# Patient Record
Sex: Female | Born: 1950 | Race: White | Hispanic: No | Marital: Married | Smoking: Never smoker
Health system: Southern US, Community
[De-identification: ages and names within clinical notes are randomized; demographics above are authoritative.]

## PROBLEM LIST (undated history)

## (undated) DIAGNOSIS — I2510 Atherosclerotic heart disease of native coronary artery without angina pectoris: Secondary | ICD-10-CM

## (undated) DIAGNOSIS — R51 Headache: Secondary | ICD-10-CM

## (undated) DIAGNOSIS — B019 Varicella without complication: Secondary | ICD-10-CM

## (undated) DIAGNOSIS — K589 Irritable bowel syndrome without diarrhea: Secondary | ICD-10-CM

## (undated) DIAGNOSIS — R519 Headache, unspecified: Secondary | ICD-10-CM

## (undated) DIAGNOSIS — R918 Other nonspecific abnormal finding of lung field: Secondary | ICD-10-CM

## (undated) DIAGNOSIS — M8580 Other specified disorders of bone density and structure, unspecified site: Secondary | ICD-10-CM

## (undated) DIAGNOSIS — M1990 Unspecified osteoarthritis, unspecified site: Secondary | ICD-10-CM

## (undated) DIAGNOSIS — G4730 Sleep apnea, unspecified: Secondary | ICD-10-CM

## (undated) DIAGNOSIS — J302 Other seasonal allergic rhinitis: Secondary | ICD-10-CM

## (undated) DIAGNOSIS — K635 Polyp of colon: Secondary | ICD-10-CM

## (undated) DIAGNOSIS — K219 Gastro-esophageal reflux disease without esophagitis: Secondary | ICD-10-CM

## (undated) DIAGNOSIS — J45909 Unspecified asthma, uncomplicated: Secondary | ICD-10-CM

## (undated) HISTORY — DX: Polyp of colon: K63.5

## (undated) HISTORY — DX: Gastro-esophageal reflux disease without esophagitis: K21.9

## (undated) HISTORY — PX: CARPAL TUNNEL RELEASE: SHX101

## (undated) HISTORY — DX: Other nonspecific abnormal finding of lung field: R91.8

## (undated) HISTORY — DX: Varicella without complication: B01.9

## (undated) HISTORY — PX: PARTIAL HYSTERECTOMY: SHX80

## (undated) HISTORY — DX: Atherosclerotic heart disease of native coronary artery without angina pectoris: I25.10

## (undated) HISTORY — DX: Unspecified osteoarthritis, unspecified site: M19.90

## (undated) HISTORY — DX: Irritable bowel syndrome without diarrhea: K58.9

## (undated) HISTORY — PX: ABDOMINAL HYSTERECTOMY: SHX81

## (undated) HISTORY — DX: Sleep apnea, unspecified: G47.30

## (undated) HISTORY — PX: MENISCUS REPAIR: SHX5179

## (undated) HISTORY — DX: Headache, unspecified: R51.9

## (undated) HISTORY — PX: ESOPHAGOGASTRODUODENOSCOPY: SHX1529

## (undated) HISTORY — DX: Other specified disorders of bone density and structure, unspecified site: M85.80

## (undated) HISTORY — PX: COLONOSCOPY: SHX174

## (undated) HISTORY — DX: Unspecified asthma, uncomplicated: J45.909

## (undated) HISTORY — PX: UPPER GASTROINTESTINAL ENDOSCOPY: SHX188

## (undated) HISTORY — DX: Headache: R51

---

## 2003-08-13 ENCOUNTER — Encounter: Payer: Self-pay | Admitting: Family Medicine

## 2004-11-14 ENCOUNTER — Ambulatory Visit: Payer: Self-pay | Admitting: Internal Medicine

## 2004-12-22 ENCOUNTER — Ambulatory Visit: Payer: Self-pay | Admitting: Internal Medicine

## 2004-12-25 HISTORY — PX: KNEE ARTHROSCOPY: SUR90

## 2004-12-27 ENCOUNTER — Ambulatory Visit: Payer: Self-pay | Admitting: General Practice

## 2006-01-17 ENCOUNTER — Ambulatory Visit: Payer: Self-pay | Admitting: Internal Medicine

## 2006-11-27 ENCOUNTER — Ambulatory Visit: Payer: Self-pay | Admitting: Internal Medicine

## 2007-01-20 ENCOUNTER — Ambulatory Visit: Payer: Self-pay | Admitting: Internal Medicine

## 2008-03-16 ENCOUNTER — Ambulatory Visit: Payer: Self-pay | Admitting: Internal Medicine

## 2008-05-25 ENCOUNTER — Ambulatory Visit: Payer: Self-pay | Admitting: Internal Medicine

## 2008-06-01 ENCOUNTER — Ambulatory Visit: Payer: Self-pay | Admitting: Internal Medicine

## 2008-08-10 ENCOUNTER — Ambulatory Visit: Payer: Self-pay | Admitting: Family Medicine

## 2008-11-10 ENCOUNTER — Encounter: Payer: Self-pay | Admitting: Family Medicine

## 2009-02-22 ENCOUNTER — Encounter (INDEPENDENT_AMBULATORY_CARE_PROVIDER_SITE_OTHER): Payer: Self-pay | Admitting: *Deleted

## 2009-03-21 ENCOUNTER — Ambulatory Visit: Payer: Self-pay | Admitting: Family Medicine

## 2009-03-21 DIAGNOSIS — K279 Peptic ulcer, site unspecified, unspecified as acute or chronic, without hemorrhage or perforation: Secondary | ICD-10-CM | POA: Insufficient documentation

## 2009-03-21 DIAGNOSIS — K219 Gastro-esophageal reflux disease without esophagitis: Secondary | ICD-10-CM

## 2009-03-21 DIAGNOSIS — G43009 Migraine without aura, not intractable, without status migrainosus: Secondary | ICD-10-CM | POA: Insufficient documentation

## 2009-03-21 DIAGNOSIS — R1013 Epigastric pain: Secondary | ICD-10-CM | POA: Insufficient documentation

## 2009-03-21 DIAGNOSIS — K580 Irritable bowel syndrome with diarrhea: Secondary | ICD-10-CM | POA: Insufficient documentation

## 2009-03-21 DIAGNOSIS — K589 Irritable bowel syndrome without diarrhea: Secondary | ICD-10-CM | POA: Insufficient documentation

## 2009-03-21 HISTORY — DX: Gastro-esophageal reflux disease without esophagitis: K21.9

## 2009-03-21 HISTORY — DX: Irritable bowel syndrome, unspecified: K58.9

## 2009-03-23 ENCOUNTER — Ambulatory Visit: Payer: Self-pay | Admitting: Family Medicine

## 2009-03-23 ENCOUNTER — Encounter: Payer: Self-pay | Admitting: Family Medicine

## 2009-03-30 ENCOUNTER — Encounter (INDEPENDENT_AMBULATORY_CARE_PROVIDER_SITE_OTHER): Payer: Self-pay | Admitting: *Deleted

## 2009-03-31 ENCOUNTER — Telehealth: Payer: Self-pay | Admitting: Family Medicine

## 2009-04-22 ENCOUNTER — Ambulatory Visit: Payer: Self-pay | Admitting: Internal Medicine

## 2009-04-22 DIAGNOSIS — K625 Hemorrhage of anus and rectum: Secondary | ICD-10-CM | POA: Insufficient documentation

## 2009-04-26 ENCOUNTER — Telehealth: Payer: Self-pay | Admitting: Internal Medicine

## 2009-05-12 ENCOUNTER — Ambulatory Visit: Payer: Self-pay | Admitting: Internal Medicine

## 2009-05-12 ENCOUNTER — Telehealth: Payer: Self-pay | Admitting: Internal Medicine

## 2009-05-12 ENCOUNTER — Encounter: Payer: Self-pay | Admitting: Internal Medicine

## 2009-05-24 ENCOUNTER — Encounter: Payer: Self-pay | Admitting: Internal Medicine

## 2009-08-10 ENCOUNTER — Ambulatory Visit: Payer: Self-pay | Admitting: Internal Medicine

## 2009-08-10 DIAGNOSIS — J069 Acute upper respiratory infection, unspecified: Secondary | ICD-10-CM | POA: Insufficient documentation

## 2009-08-15 ENCOUNTER — Ambulatory Visit: Payer: Self-pay | Admitting: Family Medicine

## 2009-08-15 DIAGNOSIS — J3089 Other allergic rhinitis: Secondary | ICD-10-CM | POA: Insufficient documentation

## 2009-08-22 ENCOUNTER — Telehealth: Payer: Self-pay | Admitting: Internal Medicine

## 2009-10-19 ENCOUNTER — Ambulatory Visit: Payer: Self-pay | Admitting: Family Medicine

## 2009-10-19 DIAGNOSIS — E7800 Pure hypercholesterolemia, unspecified: Secondary | ICD-10-CM | POA: Insufficient documentation

## 2009-10-19 HISTORY — DX: Pure hypercholesterolemia, unspecified: E78.00

## 2009-10-20 LAB — CONVERTED CEMR LAB
ALT: 21 units/L (ref 0–35)
AST: 21 units/L (ref 0–37)
Albumin: 4.2 g/dL (ref 3.5–5.2)
Alkaline Phosphatase: 87 units/L (ref 39–117)
BUN: 18 mg/dL (ref 6–23)
Bilirubin, Direct: 0 mg/dL (ref 0.0–0.3)
CO2: 30 meq/L (ref 19–32)
Calcium: 9.6 mg/dL (ref 8.4–10.5)
Chloride: 101 meq/L (ref 96–112)
Cholesterol: 217 mg/dL — ABNORMAL HIGH (ref 0–200)
Creatinine, Ser: 0.8 mg/dL (ref 0.4–1.2)
Direct LDL: 152.8 mg/dL
GFR calc non Af Amer: 78.25 mL/min (ref >60)
Glucose, Bld: 88 mg/dL (ref 70–99)
HDL: 45.3 mg/dL (ref >39.00)
Potassium: 4.9 meq/L (ref 3.5–5.1)
Sodium: 140 meq/L (ref 135–145)
Total Bilirubin: 0.6 mg/dL (ref 0.3–1.2)
Total CHOL/HDL Ratio: 5
Total Protein: 7.5 g/dL (ref 6.0–8.3)
Triglycerides: 206 mg/dL — ABNORMAL HIGH (ref 0.0–149.0)
VLDL: 41.2 mg/dL — ABNORMAL HIGH (ref 0.0–40.0)

## 2009-10-27 ENCOUNTER — Ambulatory Visit: Payer: Self-pay | Admitting: Family Medicine

## 2009-10-27 LAB — CONVERTED CEMR LAB
LDL Cholesterol: 152 mg/dL
Pap Smear: NORMAL

## 2010-01-17 ENCOUNTER — Ambulatory Visit: Payer: Self-pay | Admitting: Family Medicine

## 2010-01-23 LAB — CONVERTED CEMR LAB
Cholesterol: 198 mg/dL (ref 0–200)
HDL: 52.9 mg/dL (ref >39.00)
LDL Cholesterol: 113 mg/dL — ABNORMAL HIGH (ref 0–99)
Total CHOL/HDL Ratio: 4
Triglycerides: 161 mg/dL — ABNORMAL HIGH (ref 0.0–149.0)
VLDL: 32.2 mg/dL (ref 0.0–40.0)

## 2010-04-24 ENCOUNTER — Ambulatory Visit: Payer: Self-pay | Admitting: Family Medicine

## 2010-05-02 LAB — CONVERTED CEMR LAB
ALT: 17 units/L (ref 0–35)
AST: 17 units/L (ref 0–37)
Cholesterol: 186 mg/dL (ref 0–200)
HDL: 40.7 mg/dL (ref >39.00)
LDL Cholesterol: 107 mg/dL — ABNORMAL HIGH (ref 0–99)
Total CHOL/HDL Ratio: 5
Triglycerides: 194 mg/dL — ABNORMAL HIGH (ref 0.0–149.0)
VLDL: 38.8 mg/dL (ref 0.0–40.0)

## 2010-07-27 ENCOUNTER — Ambulatory Visit: Payer: Self-pay | Admitting: Family Medicine

## 2010-08-01 LAB — CONVERTED CEMR LAB
ALT: 21 units/L (ref 0–35)
AST: 17 units/L (ref 0–37)
Cholesterol: 229 mg/dL — ABNORMAL HIGH (ref 0–200)
Direct LDL: 167.2 mg/dL
HDL: 48 mg/dL (ref >39.00)
Total CHOL/HDL Ratio: 5
Triglycerides: 146 mg/dL (ref 0.0–149.0)
VLDL: 29.2 mg/dL (ref 0.0–40.0)

## 2010-10-11 ENCOUNTER — Telehealth: Payer: Self-pay | Admitting: Family Medicine

## 2011-01-08 ENCOUNTER — Telehealth: Payer: Self-pay | Admitting: Family Medicine

## 2011-01-21 LAB — CONVERTED CEMR LAB
ALT: 16 units/L (ref 0–35)
AST: 16 units/L (ref 0–37)
Albumin: 4.1 g/dL (ref 3.5–5.2)
Alkaline Phosphatase: 81 units/L (ref 39–117)
BUN: 19 mg/dL (ref 6–23)
Basophils Absolute: 0.1 10*3/uL (ref 0.0–0.1)
Basophils Relative: 0.7 % (ref 0.0–3.0)
Bilirubin, Direct: 0 mg/dL (ref 0.0–0.3)
CO2: 30 meq/L (ref 19–32)
Calcium: 9.2 mg/dL (ref 8.4–10.5)
Chloride: 107 meq/L (ref 96–112)
Creatinine, Ser: 1 mg/dL (ref 0.4–1.2)
Eosinophils Absolute: 0.1 10*3/uL (ref 0.0–0.7)
Eosinophils Relative: 0.6 % (ref 0.0–5.0)
GFR calc non Af Amer: 60.61 mL/min (ref >60)
Glucose, Bld: 93 mg/dL (ref 70–99)
HCT: 38.9 % (ref 36.0–46.0)
Hemoglobin: 13.6 g/dL (ref 12.0–15.0)
Lipase: 25 units/L (ref 11.0–59.0)
Lymphocytes Relative: 25.4 % (ref 12.0–46.0)
Lymphs Abs: 2.3 10*3/uL (ref 0.7–4.0)
MCHC: 35 g/dL (ref 30.0–36.0)
MCV: 92.5 fL (ref 78.0–100.0)
Monocytes Absolute: 0.6 10*3/uL (ref 0.1–1.0)
Monocytes Relative: 6.8 % (ref 3.0–12.0)
Neutro Abs: 5.8 10*3/uL (ref 1.4–7.7)
Neutrophils Relative %: 66.5 % (ref 43.0–77.0)
Platelets: 303 10*3/uL (ref 150.0–400.0)
Potassium: 4.5 meq/L (ref 3.5–5.1)
RBC: 4.21 M/uL (ref 3.87–5.11)
RDW: 11.6 % (ref 11.5–14.6)
Sodium: 143 meq/L (ref 135–145)
Total Bilirubin: 0.7 mg/dL (ref 0.3–1.2)
Total Protein: 7.3 g/dL (ref 6.0–8.3)
WBC: 8.9 10*3/uL (ref 4.5–10.5)

## 2011-01-25 NOTE — Procedures (Signed)
Summary: [facility] - Colonoscopy: hemorrhoids  [facility] - ENDOSCOPY LAB   Imported By: Carin Primrose 03/31/2009 15:40:11  _____________________________________________________________________  External Attachment:    Type:   Image     Comment:   External Document

## 2011-01-25 NOTE — Letter (Signed)
Summary: Results Follow up Letter  [name]  [address] [place], [place] [address]   Phone: [phone]  Fax: [phone]    03/30/2009 MRN: [id number]    [facility] [address] [place], [place]  [address]    Dear Ms. Claire Thomas,  The following are the results of your recent test(s):  Test         Result    Pap Smear:        Normal _____  Not Normal _____ Comments: ______________________________________________________ Cholesterol: LDL(Bad cholesterol):         Your goal is less than:         HDL (Good cholesterol):       Your goal is more than: Comments:  ______________________________________________________ Mammogram:        Normal __X___  Not Normal _____ Comments:  Yearly follow up is recommended.   ___________________________________________________________________ Hemoccult:        Normal _____  Not normal _______ Comments:    _____________________________________________________________________ Other Tests:    We routinely do not discuss normal results over the telephone.  If you desire a copy of the results, or you have any questions about this information we can discuss them at your next office visit.   Sincerely,     Excell Seltzer, M.D.  AEB:lsf

## 2011-01-25 NOTE — Progress Notes (Signed)
Summary: prob with prep  Phone Note Call from Patient Call back at Home Phone [phone]   Caller: Patient Call For: Feliciano Wynter Reason for Call: Talk to Nurse Details for Reason: proc today Summary of Call: ECL sch this afternoon-pt vomited her prep this morning Initial call taken by: Guadlupe Spanish [facility],  May 12, 2009 9:09 AM  Follow-up for Phone Call        Spoke with Dr Leone Payor and he suggested that the pt. take 2 Dulcolax and drink plenty of clear liquids up until 12:00.  She is to arrive 30 minutes early to have at least 1 fleets enema.  Pt verbalized understanding. Follow-up by: Jennye Boroughs RN,  May 12, 2009 9:23 AM

## 2011-01-25 NOTE — Progress Notes (Signed)
Summary: Rx not at pharmacy  Phone Note Call from Patient Call back at Home Phone [phone]   Caller: Patient Call For: gessner Reason for Call: Talk to Nurse Summary of Call: Rx not in the phamacy for Librax and Movi-prep Initial call taken by: Tawni Levy,  Apr 26, 2009 8:48 AM  Follow-up for Phone Call        Both prescriptions sent on 4/30 to [facility].  I left a message at patient's home and work number to [facility]. Francee Piccolo CMA  Apr 26, 2009 10:02 AM   Pt returned call and verified pharmacy.  Per Tim at [facility], both prescriptions are in bin and ready to be picked up.   Pt notified both rx's ready to be picked up. Follow-up by: Francee Piccolo CMA,  Apr 26, 2009 10:20 AM

## 2011-01-25 NOTE — Assessment & Plan Note (Signed)
Summary: epigastric pain, esophageal reflux   History of Present Illness Visit Type: Initial Consult Primary GI MD: Stan Head MD [facility] Primary Provider: Kerby Nora, MD Requesting Provider: Kerby Nora, MD Chief Complaint: epigastric pain with reflux, brbpr off and on, diarrhea History of Present Illness:   60 yo white woman with many years of "stomach trouble". Now with worsening reflux despite medication (PPI) Intermittent bright red blood per rectum  She had a colonoscopy 6 years ok, but had polyps and letter said was due to follow-up.  Does have IBS-D.  Cannot pinpoint foods that reliably cause pc defecation  Aciphex and ranitidine 1-2 x and Tums but still with sxs and food does';t move down right at times  burning and boiling in epigastrium and chest  Hx Aciphex, Prilosec  PCP in past suggested Lexapro but made her sleepy and only on x 1 month Rarely uses alprazolam Librax did help in past stress can upset stomach/bowels   GI Review of Systems    Reports abdominal pain, acid reflux, and  heartburn.     Location of  Abdominal pain: epigastric area.    Denies belching, bloating, chest pain, dysphagia with liquids, dysphagia with solids, loss of appetite, nausea, vomiting, vomiting blood, weight loss, and  weight gain.      Reports diarrhea, hemorrhoids, and  rectal bleeding.     Denies anal fissure, black tarry stools, change in bowel habit, constipation, diverticulosis, fecal incontinence, heme positive stool, irritable bowel syndrome, jaundice, light color stool, liver problems, and  rectal pain.    Current Medications (verified): 1)  Aciphex 20 Mg Tbec (Rabeprazole Sodium) .... Take 1 Tablet By Mouth Once A Day 2)  Ranitidine Hcl 150 Mg Caps (Ranitidine Hcl) .... Take 1 Tablet By Mouth Once A Day As Needed 3)  Estradiol 1 Mg Tabs (Estradiol) .... Once Daily (On Occasion) 4)  Alprazolam 0.25 Mg Tabs (Alprazolam) .... Take 2 By Mouth Daily.  Takes Maybe 2 Times Per  Year If Needed. 5)  Calcium Carbonate-Vitamin D 600-400 Mg-Unit  Tabs (Calcium Carbonate-Vitamin D) .... Takes 2 By Mouth Daily  Allergies (verified): No Known Drug Allergies  Past History:  Past Medical History:    GERD    Irritable Bowel Syndrome    Anxiety  Past Surgical History:    Reviewed history from 03/21/2009 and no changes required:    2008 torn meniscus right knee    2005 carpal tunnel repair, B    Hysterectomy, partial, both ovaries remaining: menorhagia  Family History:    Reviewed history from 03/21/2009 and no changes required:       father: prostate cancer, CAD stent age 43s       mother: colon cancer age mid 40s, mastectomy for precancerous cells B breasts       brother: healthy  Social History:    Reviewed history from 03/21/2009 and no changes required:       Land       Married       2 children: healthy       Never Smoked       Alcohol use-yes, 1 glass of wine monthly       Drug use-no       Regular exercise-no,       Diet: fruits and veggies, water  Review of Systems       The patient complains of allergy/sinus, cough, fatigue, and shortness of breath.         All other ROS negative  except as per HPI.   Vital Signs:  Patient profile:   60 year old female Height:      66 inches Weight:      190.25 pounds BMI:     30.82 Pulse rate:   78 / minute Pulse rhythm:   regular BP sitting:   118 / 78  (right arm) Cuff size:   regular  Vitals Entered By: Christie Nottingham CMA (April 22, 2009 11:29 AM)  Physical Exam  General:  Well developed, well nourished, no acute distress. overweight Eyes:  PERRLA, no icterus. Mouth:  No deformity or lesions, dentition normal. Neck:  Supple; no masses or thyromegaly. Lungs:  Clear throughout to auscultation. Heart:  Regular rate and rhythm; no murmurs, rubs,  or bruits. Abdomen:  Soft, nontender and nondistended except mildly tender epigastrium.No masses, hepatosplenomegaly or hernias  noted. Normal bowel sounds. Rectal:  deferred until time of colonoscopy.   Extremities:  No clubbing, cyanosis, edema or deformities noted. Neurologic:  Alert and  oriented x4;  grossly normal neurologically.   Impression & Recommendations:  Problem # 1:  EPIGASTRIC PAIN (ICD-789.06) Assessment Unchanged Not responding to PPI. Given background of IBS, ? functional dyspepsia. Features more consistent with stomach/duodenum or even GERD than gallbladder. Orders: Colon/Endo (Colon/Endo) CBC, CMET, lipase, H. pylori Ab all normal  Problem # 2:  GASTROESOPHAGEAL REFLUX DISEASE (ICD-530.81) Assessment: Deteriorated Not doing well despite PPI and H2 blocker. ? functional or tachyphylaxis but sounds like never realy controlled on a PPI. Orders: Colon/Endo (Colon/Endo)  Problem # 3:  DYSPHAGIA (WUJ-811.91) Assessment: New [place], some features of impact dyspagia. May need esophageal dilation. Orders: Colon/Endo (Colon/Endo)  Problem # 4:  RECTAL BLEEDING (ICD-569.3) Assessment: New Sounds anorectal but need to know. Did have hemorrhoids on last colonoscopy but no sig other pathology. Orders: Colon/Endo (Colon/Endo)  Problem # 5:  COLORECTAL CANCER, FAMILY HX MOM IN 50'S (ICD-V16.0)  Orders: Colon/Endo (Colon/Endo)  Patient Instructions: 1)  [facility] Patient Information Guide given to patient.  2)  Please pick up your medications at your pharmacy. 3)  Copy sent to : Kerby Nora, MD 4)  The medication list was reviewed and reconciled.  All changed / newly prescribed medications were explained.  A complete medication list was provided to the patient / caregiver. Prescriptions: MOVIPREP 100 GM  SOLR (PEG-KCL-NACL-NASULF-NA ASC-C) As per prep instructions.  #1 x 0   Entered by:   Francee Piccolo CMA   Authorized by:   Iva Boop MD   Signed by:   Francee Piccolo CMA on 04/22/2009   Method used:   [id number]        [address]       [place], [place]  [address]       Ph: [phone] or [phone]       Fax: [phone]   RxID:   [id number] LIBRAX 2.5-5 MG  CAPS (CLIDINIUM-CHLORDIAZEPOXIDE) Take 1 tablet by mouth before meals  #90 x 1   Entered by:   Francee Piccolo CMA   Authorized by:   Iva Boop MD   Signed by:   Francee Piccolo CMA on 04/22/2009   Method used:   [facility]        [id number]       [address]  [place], [place]  [address]       Ph: [phone] or [phone]       Fax: [phone]   RxID:   [id number]

## 2011-01-25 NOTE — Letter (Signed)
Summary: Patient Notice- Polyp Results   Gastroenterology  [address] [place], [place] [address]   Phone: [phone]  Fax: [phone]        May 24, 2009 MRN: [id number]    [facility] [address] [place], [place]  [address]    Dear Ms. HARALSON,  The polyp removed from your colon was adenomatous. This means that it was pre-cancerous or that  it had the potential to change into cancer over time.   I recommend that you have a repeat colonoscopy in 3 years to determine if you have developed any new polyps over time. If you develop any new rectal bleeding, abdominal pain or significant bowel habit changes, please contact us before then.  The stomach biopsies showed minimal inflammation, not necessarily a cause of your problems. By the time you have received this letter you should have heard from my office regarding a change in therapy and a follow-up visit.  Please call us if you are having persistent problems or have questions about your condition that have not been fully answered at this time.  Sincerely,  Iva Boop MD  This letter has been electronically signed by your physician.

## 2011-01-25 NOTE — Miscellaneous (Signed)
Summary: Pt & Physician Agreement/Lotronex  Pt & Physician Agreement/Lotronex   Imported By: Sherian Rein 09/30/2009 10:24:50  _____________________________________________________________________  External Attachment:    Type:   Image     Comment:   External Document

## 2011-01-25 NOTE — Procedures (Signed)
Summary: EGD: minimal chronic gastritis and tortuous esophagus   EGD  Procedure date:  05/12/2009  Findings:      Location: [facility]    EGD  Procedure date:  05/12/2009  Findings:      1) Mild gastritis in the body and the antrum of the stomach CHRONIC GASTRITIS NO H PYLORI 2) Tortuous in the total esophagus, suggestive of dysmotility. No stricture. 3) Otherwise normal examination  ENDOSCOPY PROCEDURE REPORT  PATIENT:  Claire Thomas, Claire Thomas  MR#:  [id number] BIRTHDATE:   25-Feb-1951, 57 yrs. old   GENDER:   female  ENDOSCOPIST:   Iva Boop, MD, [name] Referred by: Excell Seltzer, M.D.  PROCEDURE DATE:  05/12/2009 PROCEDURE:  EGD with biopsy ASA CLASS:   Class II INDICATIONS: epigastric pain, dysphagia   MEDICATIONS:    Fentanyl 25 mcg IV, Versed 6 mg IV, promethazine (Phenergan) 12.5 mg IV promethazine given in admitting TOPICAL ANESTHETIC:   Exactacain Spray  DESCRIPTION OF PROCEDURE:   After the risks benefits and alternatives of the procedure were thoroughly explained, informed consent was obtained.  The [facility] [id number] endoscope was introduced through the mouth and advanced to the second portion of the duodenum, without limitations.  The instrument was slowly withdrawn as the mucosa was fully examined. <<PROCEDUREIMAGES>>      <<OLD IMAGES>>  Mild gastritis was found in the body and the antrum of the stomach. Mottled mucosa and erythema Multiple biopsies were obtained and sent to pathology.  tortuous in the total esophagus.  Otherwise the examination was normal.    Retroflexed views revealed no abnormalities.    The scope was then withdrawn from the patient and the procedure completed.  COMPLICATIONS:   None  ENDOSCOPIC IMPRESSION:  1) Mild gastritis in the body and the antrum of the stomach  2) Tortuous in the total esophagus, suggestive of dysmotility. No stricture.  3) Otherwise normal examination RECOMMENDATIONS:  1) await pathology results,  suspect she will need different PPI and antispasmodic       Iva Boop, MD, [facility]    CC: Excell Seltzer, MD The Patient     REPORT OF SURGICAL PATHOLOGY   Case #: [id number] Patient Name: [name], [name] Office Chart Number:  [id number]   MRN: [id number] Pathologist: H. Hollice Espy, MD DOB/Age  12/20/1951 (Age: 44)    Gender: F Date Taken:  05/12/2009 Date Received: 05/13/2009   FINAL DIAGNOSIS   ***MICROSCOPIC EXAMINATION AND DIAGNOSIS***   1.  STOMACH, BIOPSY:  BENIGN GASTRIC BODY MUCOSA WITH MINIMAL CHRONIC INFLAMMATION, NO EVIDENCE OF HELICOBACTER PYLORI, INTESTINAL METAPLASIA, DYSPLASIA OR MALIGNANCY IDENTIFIED.   2.  COLON, CECUM, POLYP, BIOPSY:    TUBULAR ADENOMA (FOUR FRAGMENTS).  NO HIGH GRADE DYSPLASIA OR MALIGNANCY IDENTIFIED.   COMMENT 1.  A Warthin-Starry stain is performed to determine the possibility of the presence of Helicobacter pylori. The Warthin-Starry stain is negative for organisms of Helicobacter pylori. The control(s) stained appropriately.   2.  There is adenomatous epithelium having a predominantly tubular growth pattern consistent with a tubular adenoma if the biopsy is representative of the entire lesion. No high grade dysplasia or evidence of malignancy is identified. Clinical correlation is recommended.    kv Date Reported:  05/16/2009     H. Hollice Espy, MD *** Electronically Signed Out By [facility] ***   May 24, 2009 MRN: [id number]    [facility] [address] [place], [place]  [address]    Dear Ms. GAHM,  The polyp  removed from your colon was adenomatous. This means that it was pre-cancerous or that  it had the potential to change into cancer over time.   I recommend that you have a repeat colonoscopy in 3 years to determine if you have developed any new polyps over time. If you develop any new rectal bleeding, abdominal pain or significant bowel habit changes, please contact us before then.   The stomach biopsies  showed minimal inflammation, not necessarily a cause of your problems. By the time you have received this letter you should have heard from my office regarding a change in therapy and a follow-up visit.  Please call us if you are having persistent problems or have questions about your condition that have not been fully answered at this time.  Sincerely,  Iva Boop MD  This letter has been electronically signed by your physician. This report was created from the original endoscopy report, which was reviewed and signed by the above listed endoscopist.

## 2011-01-25 NOTE — Progress Notes (Signed)
Summary: regarding colonoscopy  Phone Note Call from Patient Call back at [phone]   Caller: Patient Call For: Claire Nora MD Summary of Call: Pt cant get appt for colonoscopy at [facility] until May 5th and she is asking if we can get her in somewhere else sooner.  Or if you think it is ok for her to wait, with the problems she is having,  she will. Initial call taken by: Lowella Petties,  March 31, 2009 9:25 AM  Follow-up for Phone Call        probalbly okay to wait, but if symptoms worse than at last OV... we can go ahead and refer to [place]...will need to get old records from [place]. let me know and I will send referral to [facility]. Follow-up by: Claire Nora MD,  March 31, 2009 9:40 AM  Additional Follow-up for Phone Call Additional follow up Details #1::        Patient Advised. She wants to be referred to [facility]. [facility] to [facility] Additional Follow-up by: Delilah Shan,  March 31, 2009 10:05 AM    Additional Follow-up for Phone Call Additional follow up Details #2::    DR GESSNER ON 04/22/2009 AT 10:30. Claire Thomas  March 31, 2009 3:49 PM  Follow-up by: Claire Thomas,  March 31, 2009 3:49 PM

## 2011-01-25 NOTE — Progress Notes (Signed)
Summary: vein burst in leg  Phone Note Call from Patient Call back at [phone]   Caller: Patient Call For: Kerby Nora MD Summary of Call: Pt states she had a vessel burst in her leg about a week and a half ago.  She has a lot of bruising, which I told her is normal, but she says the area is still tender to touch and she is asking if that is normal. Initial call taken by: Lowella Petties CMA,  October 11, 2010 3:18 PM  Follow-up for Phone Call        bruising and pain are liklely normal in this case for likely 2 weeks ...if she would like me to take a look to make a more specific evaluation please have her make and appt.  Follow-up by: Kerby Nora MD,  October 12, 2010 8:24 AM  Additional Follow-up for Phone Call Additional follow up Details #1::        Patient advised.Consuello Masse CMA   Additional Follow-up by: Benny Lennert CMA Duncan Dull),  October 12, 2010 8:37 AM

## 2011-01-25 NOTE — Letter (Signed)
Summary: New Patient Letter  [place] [facility]  [address] [place], [place] [address]   Phone: [phone]  Fax: [phone]       02/22/2009 MRN: [id number]  [facility] [address] [place], [place]  [address]  Dear Ms. Claire Thomas,   Welcome to [facility] and thank you for choosing [place] as your Primary Care Providers. Enclosed you will find information about our practice that we hope you find helpful. We have also enclosed forms to be filled out prior to your visit. This will provide [place] with the necessary information and facilitate your being seen in a timely manner. If you have any questions, please call us at:         and we will be happy to assist you. We look forward to seeing you at your scheduled appointment time.  Appointment              with Dr.                Darryl Nestle,  Primary Health Care Team  Please arrive 15 minutes early for your first appointment and bring your insurance card. Co-pay is required at the time of your visit.  *****Please call the office if you are not able to keep this appointment. There is a charge of $50.00 if any appointment is not cancelled or rescheduled within 24 hours.

## 2011-01-25 NOTE — Progress Notes (Signed)
Summary: med ?'s  Phone Note Call from Patient Call back at [phone]   Caller: Patient Call For: Dr. Leone Payor Reason for Call: Talk to Nurse Details for Reason: med ?'s Summary of Call: pt doesnt want to take Lotronex and would like to know what other med options she has Initial call taken by: Vallarie Mare,  August 22, 2009 10:13 AM  Follow-up for Phone Call        Patient has decided not to try lotronex.  She states you discussed another alternative to lotronex.  She would like to try this first.  Please advise Follow-up by: Darcey Nora RN, CGRN,  August 22, 2009 10:30 AM  Additional Follow-up for Phone Call Additional follow up Details #1::        Xifaxan 550 mg three times a day x 10 daysshe can let us know how she is after taking that, would like her to call with report 2-3 weeks after finishing Additional Follow-up by: Iva Boop MD, [facility],  August 22, 2009 11:58 AM    Additional Follow-up for Phone Call Additional follow up Details #2::    I have lefta message for the  patient.  RX sent.  Patient instructed to call back for further questions, problems, or concerns. Follow-up by: Darcey Nora RN, CGRN,  August 22, 2009 3:15 PM  New/Updated Medications: XIFAXAN 550 MG TABS (RIFAXIMIN) 1 by mouth three times a day Prescriptions: XIFAXAN 550 MG TABS (RIFAXIMIN) 1 by mouth three times a day  #30 x 0   Entered by:   Darcey Nora RN, CGRN   Authorized by:   Iva Boop MD, [facility]   Signed by:   Darcey Nora RN, CGRN on 08/22/2009   Method used:   [facility]        [id number]       [address]       [place], [place]  [address]       Ph: [phone] or [phone]       Fax: [phone]   RxID:   [id number]

## 2011-01-25 NOTE — Letter (Signed)
Summary: Records from [facility] 2007 - 2009  Records from [facility] 2007 - 2009   Imported By: Maryln Gottron 01/16/2011 13:22:12  _____________________________________________________________________  External Attachment:    Type:   Image     Comment:   External Document

## 2011-01-25 NOTE — Assessment & Plan Note (Signed)
Summary: NEW PT TO ESTABH   Vital Signs:  Patient profile:   60 year old female Height:      64.5 inches Weight:      190.38 pounds BMI:     32.29 Temp:     98.1 degrees F oral Pulse rate:   72 / minute Pulse rhythm:   regular BP sitting:   144 / 90  (left arm) Cuff size:   regular  Vitals Entered By: Delilah Shan (March 21, 2009 10:11 AM)  History of Present Illness: Chief Complaint:  New Patient to Establish  GERD, poor control on aciphex... has intermittant symptoms that she used ranitidine for. Worse symtpoms lately Recently some increase in difficulty swallowing.  Last endoscopy years ago. IBS: moderate control. Was on lexapro but this caused sedation. Uses alprazolam 2 times per year for her IBS.Marland Kitchendiarrhea and pain. In last month epigastric pain radiating to back and side occuring daily for 3-4 weeks. Last hours at a time. Diarrhea, gas after meals. Symotms flaring up in last few months. No change  with eating. No exertional component No SOB. no sweating.  Due for colonoscopy...occ sees bright redblood in stool.  No pain with  No chest pain  Use estradiol intermittantly for night sweats.  ? last CPE and labs done in 09/2008  ? with last labs had abdominal US because something was elevated...[place] was normal.   Preventive Screening-Counseling & Management     Smoking Status: never     Does Patient Exercise: no      Drug Use:  no.    Allergies (verified): No Known Drug Allergies  Past History:  Past Surgical History:    2008 torn meniscus right knee    2005 carpal tunnel repair, B    Hysterectomy, partial, both ovaries remaining: menorhagia  Family History:    father: prostate cancer, CAD stent age 54s    mother: colon cancer age mid 93s, mastectomy for precancerous cells B breasts    brother: healthy  Social History:    [place]    Married    2 children: healthy    Never Smoked    Alcohol use-yes, 1 glass of wine monthly    Drug  use-no    Regular exercise-no,    Diet: fruits and veggies, water    Occupation:  employed    Smoking Status:  never    Drug Use:  no    Does Patient Exercise:  no  Review of Systems General:  Denies fatigue. CV:  Denies chest pain or discomfort. Resp:  Denies shortness of breath. GU:  Denies dysuria. Derm:  Denies rash. Psych:  Complains of irritability; denies anxiety and depression.  Physical Exam  General:  Well-developed,well-nourished,in no acute distress; alert,appropriate and cooperative throughout examination Ears:  External ear exam shows no significant lesions or deformities.  Otoscopic examination reveals clear canals, tympanic membranes are intact bilaterally without bulging, retraction, inflammation or discharge. Hearing is grossly normal bilaterally. Nose:  External nasal examination shows no deformity or inflammation. Nasal mucosa are pink and moist without lesions or exudates. Mouth:  Oral mucosa and oropharynx without lesions or exudates.  Teeth in good repair. Neck:  no carotid bruit or thyromegaly no cervical or supraclavicular lymphadenopathy  Lungs:  Normal respiratory effort, chest expands symmetrically. Lungs are clear to auscultation, no crackles or wheezes. Heart:  Normal rate and regular rhythm. S1 and S2 normal without gallop, murmur, click, rub or other extra sounds. Abdomen:  tp in epigastrum, soft, normal  bowel sounds, no distention, no masses, no guarding, no rebound tenderness, no hepatomegaly, and no splenomegaly.   Pulses:  R and L posterior tibial pulses are full and equal bilaterally  Extremities:  no edema   Impression & Recommendations:  Problem # 1:  EPIGASTRIC PAIN (ICD-789.06) Likely due to gastritis, but PUD, pancreatitis in differental given symptromatology and history. Will evaluate with labs. Encouraged diet and lifestyle changes. If not improving, follow up with GI for likely endoscopy.  EKG: NSR, no ST changes, no Q waves making  cardiac source less likely.  Orders: EKG w/ Interpretation (93000) T-Helicobactor Pylori Antigen Stool (11914) TLB-BMP (Basic Metabolic Panel-BMET) (80048-METABOL) TLB-CBC Platelet - w/Differential (85025-CBCD) TLB-Hepatic/Liver Function Pnl (80076-HEPATIC) TLB-Lipase (83690-LIPASE)  Problem # 2:  IRRITABLE BOWEL SYNDROME (ICD-564.1) Poor control. Recommend diet changes, increase fiber and water.  Complete Medication List: 1)  Aciphex 20 Mg Tbec (Rabeprazole sodium) .... Take 1 tablet by mouth once a day 2)  Ranitidine Hcl 150 Mg Caps (Ranitidine hcl) .... Take 1 tablet by mouth once a day as needed 3)  Estradiol 1 Mg Tabs (Estradiol) .... Once daily (on occasion) 4)  Alprazolam 0.25 Mg Tabs (Alprazolam) .... Take 2 by mouth daily.  takes maybe 2 times per year if needed. 5)  Calcium Carbonate-vitamin D 600-400 Mg-unit Tabs (Calcium carbonate-vitamin d) .... Takes 2 by mouth daily  Other Orders: Radiology Referral (Radiology)  Patient Instructions: 1)  Referral Appointment Information 2)  Day/Date: 3)  Time: 4)  Place/MD: 5)  Address: 6)  Phone/Fax: 7)  Patient given appointment information. Information/Orders faxed/mailed.  8)  Please schedule a follow-up appointment in 2 weeks.         Current Allergies (reviewed today): No known allergies  TD Result Date:  12/24/2000 TD Result:  given TD Next Due:  10 yr Flex Sig Next Due:  Not Indicated Hemoccult Next Due:  Not Indicated PAP Result Date:  09/23/2008 PAP Result:  DVE no pap PAP Next Due:  3 yr

## 2011-01-25 NOTE — Progress Notes (Signed)
  Phone Note Other Incoming   Details for Reason: Old Records Summary of Call: Reviewed old records.. let pt know that she is due for her CPX  (due 10/2010).Marland Kitchen scheudle with fating labs prior unless doing elsewhere.  Initial call taken by: Kerby Nora MD,  January 08, 2011 5:33 PM     Appended Document:  LEFT MESSAGE ADVISING PATIENT TO CALL AND SCHEDULE CPX APPT.Consuello Masse CMA

## 2011-01-25 NOTE — Procedures (Signed)
Summary: Colonoscopy: 10 mm adenoma, diverticula, hemorrhoids   Colonoscopy  Procedure date:  05/12/2009  Findings:      Location:  [facility].    Colonoscopy  Procedure date:  05/12/2009  Findings:      1) 10 mm sessile polyp in the cecum, removed ADENOMA 2) Mild diverticulosis in the sigmoid colon 3) Internal hemorrhoids 4) Otherwise normal examination  Comments:      Repeat colonoscopy in 3 years.     Procedures Next Due Date:    Colonoscopy: 05/2012  COLONOSCOPY PROCEDURE REPORT  PATIENT:  Kylyn, Mcdade  MR#:  [id number] BIRTHDATE:   Mar 08, 1951, 60 yrs. old   GENDER:   female  ENDOSCOPIST:   Iva Boop, MD, [facility] Referred by: Excell Seltzer, M.D.  PROCEDURE DATE:  05/12/2009 PROCEDURE:  Colonoscopy with snare polypectomy ASA CLASS:   Class II INDICATIONS: rectal bleeding, family history of colon cancer, history of polyps   MEDICATIONS:    There was residual sedation effect present from prior procedure., Fentanyl 25 mcg IV  DESCRIPTION OF PROCEDURE:   After the risks benefits and alternatives of the procedure were thoroughly explained, informed consent was obtained.  Digital rectal exam was performed and revealed no abnormalities.   The LB [id number]-[id number] endoscope was introduced through the anus and advanced to the cecum in 5:12 minutes, which was identified by both the appendix and ileocecal valve, without limitations.  The quality of the prep was excellent, using MoviPrep.  The instrument was then slowly withdrawn as the colon was fully examined in 8:22 minutes. <<PROCEDUREIMAGES>>      <<OLD IMAGES>>  FINDINGS:  A sessile polyp was found in the cecum. It was 10 mm in size. Polyp was snared without cautery. Retrieval was successful.  Mild diverticulosis was found in the sigmoid colon.  This was otherwise a normal examination of the colon.   Retroflexed views in the rectum revealed internal hemorrhoids.    The scope was then withdrawn from the  patient and the procedure completed.  COMPLICATIONS:   None  ENDOSCOPIC IMPRESSION:  1) 10 mm sessile polyp in the cecum, removed  2) Mild diverticulosis in the sigmoid colon  3) Internal hemorrhoids  4) Otherwise normal examination RECOMMENDATIONS:  await EGD biopsies to make treatment recommendations  REPEAT EXAM:   In for Colonoscopy, pending biopsy results. will be 5 yrs at latest    Iva Boop, MD, [facility]  CC: Excell Seltzer, MD The Patient    REPORT OF SURGICAL PATHOLOGY   Case #: [id number] Patient Name: SHANETRA, BLUMENSTOCK. Office Chart Number:  [id number]   MRN: [id number] Pathologist: H. Hollice Espy, MD DOB/Age  Feb 22, 1951 (Age: 60)    Gender: F Date Taken:  05/12/2009 Date Received: 05/13/2009   FINAL DIAGNOSIS   ***MICROSCOPIC EXAMINATION AND DIAGNOSIS***   1.  STOMACH, BIOPSY:  BENIGN GASTRIC BODY MUCOSA WITH MINIMAL CHRONIC INFLAMMATION, NO EVIDENCE OF HELICOBACTER PYLORI, INTESTINAL METAPLASIA, DYSPLASIA OR MALIGNANCY IDENTIFIED.   2.  COLON, CECUM, POLYP, BIOPSY:    TUBULAR ADENOMA (FOUR FRAGMENTS).  NO HIGH GRADE DYSPLASIA OR MALIGNANCY IDENTIFIED.   COMMENT 1.  A Warthin-Starry stain is performed to determine the possibility of the presence of Helicobacter pylori. The Warthin-Starry stain is negative for organisms of Helicobacter pylori. The control(s) stained appropriately.   2.  There is adenomatous epithelium having a predominantly tubular growth pattern consistent with a tubular adenoma if the biopsy is representative of the entire lesion. No high grade dysplasia or evidence  of malignancy is identified. Clinical correlation is recommended.    kv Date Reported:  05/16/2009     H. Hollice Espy, MD *** Electronically Signed Out By [facility] ***   Clinical information Epigastric pain, bleeding, watery diarrhea, rule out h. pylori, rule out adenoma (mj)   specimen(s) obtained 1: Stomach, biopsy 2: Colon, polyp(s), cecum   Gross  Description 1.  Received in formalin are tan, soft tissue fragments that are submitted in toto.  Number:  4          Size:      0.2 to 0.3 cm     2.  Received in formalin are tan, soft tissue fragments that are submitted in toto.  Number:   4          Size:      0.2 to 0.4 cm  (JBM:kv 05-13-09)   kv/     Signed by Iva Boop MD on 05/24/2009 at 8:52 AM  ________________________________________________________________________ colon recall 3 yrs' no EGD recall  call from office and change Aciphex to Pantoprazole 40 mg daily #30 with 2 refills and  REV 6-8 weeks   Signed by Iva Boop MD on 05/24/2009 at 9:01 AM  ________________________________________________________________________  May 24, 2009 MRN: [id number]    [facility] [address] [place], [place]  [address]    Dear Ms. Joen Laura,  The polyp removed from your colon was adenomatous. This means that it was pre-cancerous or that  it had the potential to change into cancer over time.   I recommend that you have a repeat colonoscopy in 3 years to determine if you have developed any new polyps over time. If you develop any new rectal bleeding, abdominal pain or significant bowel habit changes, please contact us before then.   The stomach biopsies showed minimal inflammation, not necessarily a cause of your problems. By the time you have received this letter you should have heard from my office regarding a change in therapy and a follow-up visit.  Please call us if you are having persistent problems or have questions about your condition that have not been fully answered at this time.  Sincerely,  Iva Boop MD  This letter has been electronically signed by your physician. This report was created from the original endoscopy report, which was reviewed and signed by the above listed endoscopist.

## 2011-01-25 NOTE — Assessment & Plan Note (Signed)
Summary: SINUS INFECTION CYD   Vital Signs:  Patient profile:   60 year old female Height:      66 inches Weight:      191.8 pounds BMI:     31.07 Temp:     97.8 degrees F oral Pulse rate:   64 / minute Pulse rhythm:   regular BP sitting:   120 / 70  (left arm) Cuff size:   large  Vitals Entered By: Benny Lennert CMA (August 15, 2009 2:10 PM)  History of Present Illness: Chief complaint ? sinus infection cough and headache for the past month. Went to GI and they did give her a Z-PACK.  Acute Visit History:      The patient complains of cough, headache, nasal discharge, and sinus problems.  These symptoms began 3 weeks ago.  She denies earache and fever.  Other comments include: chest congestion pain behind eyes post nasal drip Dr. Leone Payor gave her a rx for a Zpack...Marland Kitchenhelped some initially but overall minimal improvement Using mucinex and ibuprofen.        The character of the cough is described as nonproductive.  There is no history of wheezing or shortness of breath associated with her cough.        She complains of sinus pressure, ears being blocked, and frontal headache.        Problems Prior to Update: 1)  Upper Respiratory Infection  (ICD-465.9) 2)  Dysphagia  (ICD-787.29) 3)  Colorectal Cancer, Family Hx Mom in 71's  (ICD-V16.0) 4)  Rectal Bleeding  (ICD-569.3) 5)  Epigastric Pain  (ICD-789.06) 6)  Other Screening Mammogram  (ICD-V76.12) 7)  Hx of Common Migraine  (ICD-346.10) 8)  Gastroesophageal Reflux Disease  (ICD-530.81) 9)  Irritable Bowel Syndrome  (ICD-564.1) 10)  Pud  (ICD-533.90)  Current Medications (verified): 1)  Ranitidine Hcl 150 Mg Caps (Ranitidine Hcl) .... Take 1 Tablet By Mouth Once A Day As Needed 2)  Estradiol 1 Mg Tabs (Estradiol) .... Once Daily 3)  Alprazolam 0.25 Mg Tabs (Alprazolam) .... Take 2 By Mouth Daily.  Takes Maybe 2 Times Per Year If Needed. 4)  Calcium Carbonate-Vitamin D 600-400 Mg-Unit  Tabs (Calcium Carbonate-Vitamin D) ....  Takes 2 By Mouth Daily 5)  Pantoprazole Sodium 40 Mg  Tbec (Pantoprazole Sodium) .Marland Kitchen.. 1 Each Day 30 Minutes Before Meal 6)  Lotronex 0.5 Mg  Tabs (Alosetron Hcl) .Marland Kitchen.. 1 By Mouth Two Times A Day 7)  Fluticasone Propionate 50 Mcg/act Susp (Fluticasone Propionate) .... 2 Sprays Per Nostril Daily 8)  Avelox 400 Mg Tabs (Moxifloxacin Hcl) .Marland Kitchen.. 1 Tab By Mouth Daily X 10  Allergies (verified): No Known Drug Allergies  Past History:  Past medical, surgical, family and social histories (including risk factors) reviewed, and no changes noted (except as noted below).  Past Medical History: Reviewed history from 04/22/2009 and no changes required. GERD Irritable Bowel Syndrome Anxiety  Past Surgical History: Reviewed history from 03/21/2009 and no changes required. 2008 torn meniscus right knee 2005 carpal tunnel repair, B Hysterectomy, partial, both ovaries remaining: menorhagia  Family History: Reviewed history from 03/21/2009 and no changes required. father: prostate cancer, CAD stent age 72s mother: colon cancer age mid 11s, mastectomy for precancerous cells B breasts brother: healthy  Social History: Reviewed history from 03/21/2009 and no changes required. Occupation:office manager Married 2 children: healthy Never Smoked Alcohol use-yes, 1 glass of wine monthly Drug use-no Regular exercise-no, Diet: fruits and veggies, water  Review of Systems General:  Complains of fatigue. CV:  Denies chest pain or discomfort. GI:  seeing GI MD.  Physical Exam  General:  Well-developed,well-nourished,in no acute distress; alert,appropriate and cooperative throughout examination Head:  tttp B maxillary sinuses Ears:  External ear exam shows no significant lesions or deformities.  Otoscopic examination reveals clear canals, tympanic membranes are intact bilaterally without bulging, retraction, inflammation or discharge. Hearing is grossly normal bilaterally. Nose:  nasal  dischargemucosal pallor.   Mouth:  Oral mucosa and oropharynx without lesions or exudates.  Teeth in good repair. Neck:  no carotid bruit or thyromegaly no cervical or supraclavicular lymphadenopathy  Lungs:  Normal respiratory effort, chest expands symmetrically. Lungs are clear to auscultation, no crackles or wheezes. Heart:  Normal rate and regular rhythm. S1 and S2 normal without gallop, murmur, click, rub or other extra sounds.   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 ? due to allergies. Treaet with Zyrtec and nasal sray. Cough may be due to PND.  If not improving may consider filling eantibiotic for ? partially treated sinus infection.   Complete Medication List: 1)  Ranitidine Hcl 150 Mg Caps (Ranitidine hcl) .... Take 1 tablet by mouth once a day as needed 2)  Estradiol 1 Mg Tabs (Estradiol) .... Once daily 3)  Alprazolam 0.25 Mg Tabs (Alprazolam) .... Take 2 by mouth daily.  takes maybe 2 times per year if needed. 4)  Calcium Carbonate-vitamin D 600-400 Mg-unit Tabs (Calcium carbonate-vitamin d) .... Takes 2 by mouth daily 5)  Pantoprazole Sodium 40 Mg Tbec (Pantoprazole sodium) .Marland Kitchen.. 1 each day 30 minutes before meal 6)  Lotronex 0.5 Mg Tabs (Alosetron hcl) .Marland Kitchen.. 1 by mouth two times a day 7)  Fluticasone Propionate 50 Mcg/act Susp (Fluticasone propionate) .... 2 sprays per nostril daily 8)  Avelox 400 Mg Tabs (Moxifloxacin hcl) .Marland Kitchen.. 1 tab by mouth daily x 10  Patient Instructions: 1)  Try Zyrtec at bedtime daily. 2)  Start nasal steroid...fluticasone 2 sprays per nostril daiuly. 3)  If stilll not improving in 48-72 hours may rx for avelox.  4)  SCheduel CPE with labs prior...CMET, lipids Dx 272.0 Prescriptions: ESTRADIOL 1 MG TABS (ESTRADIOL) once daily  #30 x 3   Entered and Authorized by:   Kerby Nora MD   Signed by:   Kerby Nora MD on 08/15/2009   Method used:   Electronically to         [id number] * (retail)       [address]       [place], [place]  [address]       Ph: [phone] or [phone]       Fax: [phone]   RxID:   [id number] AVELOX 400 MG TABS (MOXIFLOXACIN HCL) 1 tab by mouth daily x 10  #10 x 0   Entered and Authorized by:   Kerby Nora MD   Signed by:   Kerby Nora MD on 08/15/2009   Method used:   Print then Give to Patient   RxID:   [id number] FLUTICASONE PROPIONATE 50 MCG/ACT SUSP (FLUTICASONE PROPIONATE) 2 sprays per nostril daily  #1 x 0   Entered and Authorized by:   Kerby Nora MD   Signed by:   Kerby Nora MD on 08/15/2009   Method used:   Electronically to        [phone] * (retail)  [address]       [place], [place]  [address]       Ph: [phone] or [phone]       Fax: [phone]   RxID:   [id number]   Current Allergies (reviewed today): No known allergies

## 2011-01-25 NOTE — Assessment & Plan Note (Signed)
Summary: cpx/dlo   Vital Signs:  Patient profile:   60 year old female Height:      66 inches Weight:      193.13 pounds BMI:     31.28 Temp:     98.2 degrees F oral Pulse rate:   88 / minute Pulse rhythm:   regular BP sitting:   152 / 90  (left arm) Cuff size:   large  Vitals Entered By: Delilah Shan CMA Duncan Dull) (October 27, 2009 1:53 PM) CC: CPX      Last PAP Date 10/27/2009 Last PAP Result DVE normal, no pap   Problems Prior to Update: 1)  Pure Hypercholesterolemia  (ICD-272.0) 2)  Allergic Rhinitis Due To Other Allergen  (ICD-477.8) 3)  Upper Respiratory Infection  (ICD-465.9) 4)  Dysphagia  (ICD-787.29) 5)  Colorectal Cancer, Family Hx Mom in 34's  (ICD-V16.0) 6)  Rectal Bleeding  (ICD-569.3) 7)  Epigastric Pain  (ICD-789.06) 8)  Other Screening Mammogram  (ICD-V76.12) 9)  Hx of Common Migraine  (ICD-346.10) 10)  Gastroesophageal Reflux Disease  (ICD-530.81) 11)  Irritable Bowel Syndrome  (ICD-564.1) 12)  Pud  (ICD-533.90)  Current Medications (verified): 1)  Ranitidine Hcl 150 Mg Caps (Ranitidine Hcl) .... Take 1 Tablet By Mouth Once A Day As Needed 2)  Estradiol 1 Mg Tabs (Estradiol) .... Once Daily As Needed 3)  Alprazolam 0.25 Mg Tabs (Alprazolam) .... Take 2 By Mouth Daily.  Takes Maybe 2 Times Per Year If Needed. 4)  Calcium Carbonate-Vitamin D 600-400 Mg-Unit  Tabs (Calcium Carbonate-Vitamin D) .... Takes 2 By Mouth Daily 5)  Pantoprazole Sodium 40 Mg  Tbec (Pantoprazole Sodium) .Marland Kitchen.. 1 Each Day 30 Minutes Before Meal 6)  Fluticasone Propionate 50 Mcg/act Susp (Fluticasone Propionate) .... 2 Sprays Per Nostril Daily 7)  Chlordiazepoxide Hcl 10 Mg Caps (Chlordiazepoxide Hcl) .... Take 1 Capsule By Mouth Two Times A Day   ? Mg. 8)  Zyrtec Allergy 10 Mg Tabs (Cetirizine Hcl) .... Take 1 Tab By Mouth At Bedtime 9)  Melatonin 5 Mg Tabs (Melatonin) .... Take 1 Tab By Mouth At Bedtime  Allergies (verified): No Known Drug Allergies  Past History:  Past  medical, surgical, family and social histories (including risk factors) reviewed, and no changes noted (except as noted below).  Past Medical History: Reviewed history from 04/22/2009 and no changes required. GERD Irritable Bowel Syndrome Anxiety  Past Surgical History: Reviewed history from 03/21/2009 and no changes required. 2008 torn meniscus right knee 2005 carpal tunnel repair, B Hysterectomy, partial, both ovaries remaining: menorhagia  Family History: Reviewed history from 03/21/2009 and no changes required. father: prostate cancer, CAD stent age 74s mother: colon cancer age mid 47s, mastectomy for precancerous cells B breasts brother: healthy  Social History: Reviewed history from 03/21/2009 and no changes required. 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 CV:  Denies chest pain or discomfort. Resp:  Denies shortness of breath. GI:  Denies abdominal pain, bloody stools, constipation, and diarrhea. GU:  Denies abnormal vaginal bleeding and dysuria. Derm:  Denies lesion(s).  Physical Exam  General:  Well-developed,well-nourished,in no acute distress; alert,appropriate and cooperative throughout examination Head:  no maxillary tenderness Eyes:  No corneal or conjunctival inflammation noted. EOMI. Perrla. Funduscopic exam benign, without hemorrhages, exudates or papilledema. Vision grossly normal. Ears:  External ear exam shows no significant lesions or deformities.  Otoscopic examination reveals clear canals, tympanic membranes are intact bilaterally without bulging, retraction, inflammation or discharge. Hearing is grossly normal bilaterally. Nose:  External nasal examination shows no deformity or inflammation. Nasal mucosa are pink and moist without lesions or exudates. Mouth:   MMM Neck:  no carotid bruit or thyromegaly no cervical or supraclavicular lymphadenopathy  Chest Wall:  No deformities, masses, or tenderness noted. Breasts:  No mass, nodules, thickening, tenderness, bulging, retraction, inflamation, nipple discharge or skin changes noted.   Lungs:  Normal respiratory effort, chest expands symmetrically. Lungs are clear to auscultation, no crackles or wheezes. Heart:  Normal rate and regular rhythm. S1 and S2 normal without gallop, murmur, click, rub or other extra sounds. Abdomen:  Bowel sounds positive,abdomen soft and non-tender without masses, organomegaly or hernias noted. Genitalia:  Pelvic Exam:        External: normal female genitalia without lesions or masses        Vagina: normal without lesions or masses        Cervix: none        Adnexa: normal bimanual exam without masses or fullness        Uterus: none        Pap smear: not performed Pulses:  R and L posterior tibial pulses are full and equal bilaterally  Extremities:  no edema Skin:  multiple moles, none abnormal appearing   Impression & Recommendations:  Problem # 1:  PREVENTIVE HEALTH CARE (ICD-V70.0) Reviewed preventive care protocols, scheduled due services, and updated immunizations. Encouraged exercise, weight loss, healthy eating habits.   Problem # 2:  ROUTINE GYNECOLOGICAL EXAMINATION (ICD-V72.31) No pap , DVE performed..B ovaries remain.   Complete Medication List: 1)  Ranitidine Hcl 150 Mg Caps (Ranitidine hcl) .... Take 1 tablet by mouth once a day as needed 2)  Estradiol 1 Mg Tabs (Estradiol) .... Once daily as needed 3)  Alprazolam 0.25 Mg Tabs (Alprazolam) .... Take 2 by mouth daily.  takes maybe 2 times per year if needed. 4)  Calcium Carbonate-vitamin D 600-400 Mg-unit Tabs (Calcium carbonate-vitamin d) .... Takes 2 by mouth daily 5)  Pantoprazole Sodium 40 Mg Tbec (Pantoprazole sodium) .Marland Kitchen.. 1 each day 30 minutes before meal 6)  Fluticasone Propionate 50 Mcg/act Susp  (Fluticasone propionate) .... 2 sprays per nostril daily 7)  Chlordiazepoxide Hcl 10 Mg Caps (Chlordiazepoxide hcl) .... Take 1 capsule by mouth two times a day   ? mg. 8)  Zyrtec Allergy 10 Mg Tabs (Cetirizine hcl) .... Take 1 tab by mouth at bedtime 9)  Melatonin 5 Mg Tabs (Melatonin) .... Take 1 tab by mouth at bedtime  Patient Instructions: 1)  Fish oil, omega 3 fatty acids...2000 mg divided daily.  2)  Recheck fasting lipids  in 3 months Dx 272.0    3)  Follow BP at home.Marland Kitchengoal <140/90 4)  Please schedule a follow-up appointment in 1 year, earlier if any problems arise.   Current Allergies (reviewed today): No known allergies    Family History:    Reviewed history from 03/21/2009 and no changes required:       father: prostate cancer, CAD stent age 4s       mother: colon cancer age mid 62s, mastectomy for precancerous cells B breasts       brother: healthy  Social History:    Reviewed history from 03/21/2009 and no changes required:       [place]       Married       2  children: healthy       Never Smoked       Alcohol use-yes, 1 glass of wine monthly       Drug use-no       Regular exercise-no,       Diet: fruits and veggies, water   Flu Vaccine Result Date:  10/27/2009 Flu Vaccine Result:  given Flu Vaccine Next Due:  1 yr LDL Result Date:  10/27/2009 LDL Result:  152 LDL Next Due:  12 wk Last PAP:  DVE no pap (09/23/2008 10:39:17 PM) PAP Result Date:  10/27/2009 PAP Result:  DVE normal, no pap PAP Next Due:  1 yr  Appended Document: cpx/dlo Flu Vaccine Consent Questions     Do you have a history of severe allergic reactions to this vaccine? no    Any prior history of allergic reactions to egg and/or gelatin? no    Do you have a sensitivity to the preservative Thimersol? no    Do you have a past history of Guillan-Barre Syndrome? no    Do you currently have an acute febrile illness? no    Have you ever had a severe reaction to latex? no     Vaccine information given and explained to patient? yes    Are you currently pregnant? no    Lot Number:[id number]   Exp Date:06/22/2010   Site Given  Left Deltoid IM    Clinical Lists Changes  Orders: Added new Service order of Admin 1st Vaccine (16109) - Signed Added new Service order of Flu Vaccine 78yrs + [phone]) - Signed Observations: Added new observation of FLU VAX VIS: 08/02/09 version (10/27/2009 15:15) Added new observation of FLU VAXLOT: [id number] (10/27/2009 15:15) Added new observation of FLU VAXMFR: [employer] (10/27/2009 15:15) Added new observation of FLU VAX EXP: 06/22/2010 (10/27/2009 15:15) Added new observation of FLU VAX DSE: 0.45ml (10/27/2009 15:15) Added new observation of FLU VAX: Fluvax 3+ (10/27/2009 15:15)

## 2011-01-25 NOTE — Assessment & Plan Note (Signed)
Summary: IBS, GERD, URI: Start Lotronex, Zpak   History of Present Illness Visit Type: Follow-up Visit Primary GI MD: Stan Head MD [facility] Primary Erby Sanderson: Kerby Nora, MD Requesting Deckard Stuber: Kerby Nora, MD Chief Complaint: F/U ECL History of Present Illness:   Rectal bleeding is much better. Diarrhea is stilla problem. Intermittent and unpredictable.  Abdominal pain is better, less frequent. Does have some nocturnal, urgent stools. When she gets upper abdominal pain and bloating there seems to be less diarrhea but does still have significant post-prandial urgent defecation and loose stools.    GI Review of Systems      Denies abdominal pain, acid reflux, belching, bloating, chest pain, dysphagia with liquids, dysphagia with solids, heartburn, loss of appetite, nausea, vomiting, vomiting blood, weight loss, and  weight gain.      Reports diarrhea.     Denies anal fissure, black tarry stools, change in bowel habit, constipation, diverticulosis, fecal incontinence, heme positive stool, hemorrhoids, irritable bowel syndrome, jaundice, light color stool, liver problems, rectal bleeding, and  rectal pain.    Current Medications (verified): 1)  Ranitidine Hcl 150 Mg Caps (Ranitidine Hcl) .... Take 1 Tablet By Mouth Once A Day As Needed 2)  Estradiol 1 Mg Tabs (Estradiol) .... Once Daily (On Occasion) 3)  Alprazolam 0.25 Mg Tabs (Alprazolam) .... Take 2 By Mouth Daily.  Takes Maybe 2 Times Per Year If Needed. 4)  Calcium Carbonate-Vitamin D 600-400 Mg-Unit  Tabs (Calcium Carbonate-Vitamin D) .... Takes 2 By Mouth Daily 5)  Librax 2.5-5 Mg  Caps (Clidinium-Chlordiazepoxide) .... Take 1 Tablet By Mouth Before Meals 6)  Pantoprazole Sodium 40 Mg  Tbec (Pantoprazole Sodium) .Marland Kitchen.. 1 Each Day 30 Minutes Before Meal  Allergies (verified): No Known Drug Allergies  Past History:  Past Medical History: Reviewed history from 04/22/2009 and no changes required. GERD Irritable Bowel  Syndrome Anxiety  Past Surgical History: Reviewed history from 03/21/2009 and no changes required. 2008 torn meniscus right knee 2005 carpal tunnel repair, B Hysterectomy, partial, both ovaries remaining: menorhagia  Family History: Reviewed history from 03/21/2009 and no changes required. father: prostate cancer, CAD stent age 1s mother: colon cancer age mid 23s, mastectomy for precancerous cells B breasts brother: healthy  Social History: Reviewed history from 03/21/2009 and no changes required. 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 overweight Mouth:  mild pharyngeal erythema Neck:  supple, non-tender Lungs:  Clear throughout to auscultation. Heart:  Regular rate and rhythm; no murmurs, rubs,  or bruits.   Impression & Recommendations:  Problem # 1:  IRRITABLE BOWEL SYNDROME (ICD-564.1) Assessment Unchanged Librax has not helped so will stop. Lotronex is an option and we discuissed pros/cons. She has read the patient agreement and signed it and will try 0.5 mg two times a day.  Problem #  2:  GASTROESOPHAGEAL REFLUX DISEASE (ICD-530.81) Assessment: Improved Continue pantoprazole 40 mg once daily.  Problem # 3:  UPPER RESPIRATORY INFECTION (ICD-465.9) 2 weeks of sxs Zpak  Patient Instructions: 1)  Stop Librax 2)  Call back in 3 weeks to report response to Lotronex. 3)  At  that time we can refill it and/or arrange follow-up. 4)  Please pick up your medications at your pharmacy. 5)  The medication list was reviewed and reconciled.  All changed / newly prescribed medications were explained.  A complete medication list was provided to the patient / caregiver. Prescriptions: AZITHROMYCIN 250 MG TABS (AZITHROMYCIN) 2 by mouth once daily then 1 by mouth once daily x 4 days  #6 x 0   Entered and Authorized by:   Iva Boop MD, [facility]   Signed by:   Iva Boop MD, [facility] on 08/10/2009   Method used:   Electronically to        [id number] * (retail)       [address]       [place], [place]  [address]       Ph: [phone] or [phone]       Fax: [phone]   RxID:   [id number] LOTRONEX 0.5 MG  TABS (ALOSETRON HCL) 1 by mouth two times a day  #60 x 0   Entered and Authorized by:   Iva Boop MD, [facility]   Signed by:   Iva Boop MD, FACG on 08/10/2009   Method used:   Printed then faxed to ...       [id number] * (retail)       [address]       [place], [place]  [address]       Ph: [phone] or [phone]       Fax: [phone]   RxID:   [id number]

## 2011-03-06 ENCOUNTER — Encounter: Payer: Self-pay | Admitting: Family Medicine

## 2011-03-06 ENCOUNTER — Encounter (INDEPENDENT_AMBULATORY_CARE_PROVIDER_SITE_OTHER): Payer: BC Managed Care – PPO | Admitting: Family Medicine

## 2011-03-06 ENCOUNTER — Other Ambulatory Visit: Payer: Self-pay | Admitting: Family Medicine

## 2011-03-06 DIAGNOSIS — R5383 Other fatigue: Secondary | ICD-10-CM

## 2011-03-06 DIAGNOSIS — Z01419 Encounter for gynecological examination (general) (routine) without abnormal findings: Secondary | ICD-10-CM

## 2011-03-06 DIAGNOSIS — E785 Hyperlipidemia, unspecified: Secondary | ICD-10-CM

## 2011-03-06 DIAGNOSIS — R5381 Other malaise: Secondary | ICD-10-CM

## 2011-03-06 DIAGNOSIS — Z0000 Encounter for general adult medical examination without abnormal findings: Secondary | ICD-10-CM

## 2011-03-06 DIAGNOSIS — E7800 Pure hypercholesterolemia, unspecified: Secondary | ICD-10-CM

## 2011-03-06 DIAGNOSIS — Z23 Encounter for immunization: Secondary | ICD-10-CM

## 2011-03-06 LAB — HEPATIC FUNCTION PANEL
ALT: 19 U/L (ref 0–35)
AST: 18 U/L (ref 0–37)
Albumin: 4.4 g/dL (ref 3.5–5.2)
Alkaline Phosphatase: 87 U/L (ref 39–117)
Bilirubin, Direct: 0.1 mg/dL (ref 0.0–0.3)
Total Bilirubin: 0.4 mg/dL (ref 0.3–1.2)
Total Protein: 7.2 g/dL (ref 6.0–8.3)

## 2011-03-06 LAB — LIPID PANEL
Cholesterol: 215 mg/dL — ABNORMAL HIGH (ref 0–200)
HDL: 48.6 mg/dL (ref >39.00)
Total CHOL/HDL Ratio: 4
Triglycerides: 160 mg/dL — ABNORMAL HIGH (ref 0.0–149.0)
VLDL: 32 mg/dL (ref 0.0–40.0)

## 2011-03-06 LAB — CBC WITH DIFFERENTIAL/PLATELET
Basophils Absolute: 0 10*3/uL (ref 0.0–0.1)
Basophils Relative: 0.4 % (ref 0.0–3.0)
Eosinophils Absolute: 0 10*3/uL (ref 0.0–0.7)
Eosinophils Relative: 0.5 % (ref 0.0–5.0)
HCT: 39.2 % (ref 36.0–46.0)
Hemoglobin: 13.6 g/dL (ref 12.0–15.0)
Lymphocytes Relative: 35.8 % (ref 12.0–46.0)
Lymphs Abs: 2.7 10*3/uL (ref 0.7–4.0)
MCHC: 34.8 g/dL (ref 30.0–36.0)
MCV: 92.8 fl (ref 78.0–100.0)
Monocytes Absolute: 0.5 10*3/uL (ref 0.1–1.0)
Monocytes Relative: 6.3 % (ref 3.0–12.0)
Neutro Abs: 4.2 10*3/uL (ref 1.4–7.7)
Neutrophils Relative %: 57 % (ref 43.0–77.0)
Platelets: 289 10*3/uL (ref 150.0–400.0)
RBC: 4.22 Mil/uL (ref 3.87–5.11)
RDW: 12.8 % (ref 11.5–14.6)
WBC: 7.4 10*3/uL (ref 4.5–10.5)

## 2011-03-06 LAB — BASIC METABOLIC PANEL
BUN: 13 mg/dL (ref 6–23)
CO2: 30 mEq/L (ref 19–32)
Calcium: 9.4 mg/dL (ref 8.4–10.5)
Chloride: 102 mEq/L (ref 96–112)
Creatinine, Ser: 0.8 mg/dL (ref 0.4–1.2)
GFR: 75.69 mL/min (ref >60.00)
Glucose, Bld: 90 mg/dL (ref 70–99)
Potassium: 4.9 mEq/L (ref 3.5–5.1)
Sodium: 139 mEq/L (ref 135–145)

## 2011-03-06 LAB — CONVERTED CEMR LAB

## 2011-03-06 LAB — TSH: TSH: 1.38 u[IU]/mL (ref 0.35–5.50)

## 2011-03-06 LAB — B12 AND FOLATE PANEL
Folate: 19 ng/mL (ref >5.9)
Vitamin B-12: 255 pg/mL (ref 211–911)

## 2011-03-06 LAB — LDL CHOLESTEROL, DIRECT: Direct LDL: 143 mg/dL

## 2011-03-07 ENCOUNTER — Ambulatory Visit: Payer: Self-pay | Admitting: Family Medicine

## 2011-03-07 ENCOUNTER — Encounter: Payer: Self-pay | Admitting: Family Medicine

## 2011-03-08 ENCOUNTER — Encounter (INDEPENDENT_AMBULATORY_CARE_PROVIDER_SITE_OTHER): Payer: Self-pay | Admitting: *Deleted

## 2011-03-13 ENCOUNTER — Ambulatory Visit: Payer: Self-pay | Admitting: Family Medicine

## 2011-03-13 NOTE — Assessment & Plan Note (Signed)
Summary: CPX/CLE  [employer] PT ASKED TO DO LABS SAME DAY AS CPX   Vital Signs:  Patient profile:   60 year old female Height:      66 inches Weight:      189.25 pounds BMI:     30.66 Temp:     97.5 degrees F oral Pulse rate:   80 / minute Pulse rhythm:   regular BP sitting:   120 / 70  (left arm) Cuff size:   large  Vitals Entered By: Benny Lennert CMA Duncan Dull) (March 06, 2011 9:09 AM)  History of Present Illness: Chief complaint cpx   The patient is here for annual wellness exam and preventative care.     Last CPX in 2010. SInce then doing well overall.        Preventive Screening-Counseling & Management  Alcohol-Tobacco     Smoking Status: never  Caffeine-Diet-Exercise     Diet Comments: moderate     Diet Counseling: to improve diet; diet is suboptimal     Does Patient Exercise: yes     Times/week: 1     Exercise Counseling: to improve exercise regimen  Problems Prior to Update: 1)  Routine Gynecological Examination  (ICD-V72.31) 2)  Preventive Health Care  (ICD-V70.0) 3)  Pure Hypercholesterolemia  (ICD-272.0) 4)  Allergic Rhinitis Due To Other Allergen  (ICD-477.8) 5)  Upper Respiratory Infection  (ICD-465.9) 6)  Dysphagia  (ICD-787.29) 7)  Colorectal Cancer, Family Hx Mom in 50's  (ICD-V16.0) 8)  Rectal Bleeding  (ICD-569.3) 9)  Epigastric Pain  (ICD-789.06) 10)  Other Screening Mammogram  (ICD-V76.12) 11)  Hx of Common Migraine  (ICD-346.10) 12)  Gastroesophageal Reflux Disease  (ICD-530.81) 13)  Irritable Bowel Syndrome  (ICD-564.1) 14)  Pud  (ICD-533.90)  Current Medications (verified): 1)  Ranitidine Hcl 150 Mg Caps (Ranitidine Hcl) .... Take 1 Tablet By Mouth Once A Day As Needed 2)  Estradiol 1 Mg Tabs (Estradiol) .... Once Daily As Needed 3)  Alprazolam 0.25 Mg Tabs (Alprazolam) .... Take 2 By Mouth Daily.  Takes Maybe 2 Times Per Year If Needed. 4)  Calcium Carbonate-Vitamin D 600-400 Mg-Unit  Tabs (Calcium Carbonate-Vitamin D) .... Takes 2 By  Mouth Daily 5)  Pantoprazole Sodium 40 Mg  Tbec (Pantoprazole Sodium) .Marland Kitchen.. 1 Each Day 30 Minutes Before Meal 6)  Fluticasone Propionate 50 Mcg/act Susp (Fluticasone Propionate) .... 2 Sprays Per Nostril Daily 7)  Chlordiazepoxide Hcl 10 Mg Caps (Chlordiazepoxide Hcl) .... Take 1 Capsule By Mouth Two Times A Day   ? Mg. 8)  Zyrtec Allergy 10 Mg Tabs (Cetirizine Hcl) .... Take 1 Tab By Mouth At Bedtime 9)  Melatonin 5 Mg Tabs (Melatonin) .... Take 1 Tab By Mouth At Bedtime  Allergies (verified): No Known Drug Allergies  Past History:  Past medical, surgical, family and social histories (including risk factors) reviewed, and no changes noted (except as noted below).  Past Medical History: GERD Irritable Bowel Syndrome Anxiety Left eye... addis syndrome  Past Surgical History: Reviewed history from 03/21/2009 and no changes required. 2008 torn meniscus right knee 2005 carpal tunnel repair, B Hysterectomy, partial, both ovaries remaining: menorhagia  Family History: Reviewed history from 03/21/2009 and no changes required. father: prostate cancer, CAD stent age 13s mother: colon cancer age mid 22s, mastectomy for precancerous cells B breasts brother: healthy  Social History: Reviewed history from 03/21/2009 and no changes required. 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 CV:  Denies chest pain or discomfort. Resp:  Denies shortness of breath. GI:  Denies abdominal pain and bloody stools. GU:  Denies abnormal vaginal bleeding and dysuria. Psych:  Denies anxiety and depression.  Physical Exam  General:  Well-developed,well-nourished,in no acute distress; alert,appropriate and cooperative throughout examination Eyes:   No corneal or conjunctival inflammation noted. EOMI. Perrla. Funduscopic exam benign, without hemorrhages, exudates or papilledema. Vision grossly normal. Ears:  External ear exam shows no significant lesions or deformities.  Otoscopic examination reveals clear canals, tympanic membranes are intact bilaterally without bulging, retraction, inflammation or discharge. Hearing is grossly normal bilaterally. Nose:  External nasal examination shows no deformity or inflammation. Nasal mucosa are pink and moist without lesions or exudates. Mouth:  Oral mucosa and oropharynx without lesions or exudates.  Teeth in good repair. Neck:  no carotid bruit or thyromegaly no cervical or supraclavicular lymphadenopathy  Chest Wall:  No deformities, masses, or tenderness noted. Breasts:  No mass, nodules, thickening, tenderness, bulging, retraction, inflamation, nipple discharge or skin changes noted.   Lungs:  Normal respiratory effort, chest expands symmetrically. Lungs are clear to auscultation, no crackles or wheezes. Heart:  Normal rate and regular rhythm. S1 and S2 normal without gallop, murmur, click, rub or other extra sounds. Abdomen:  Bowel sounds positive,abdomen soft and non-tender without masses, organomegaly or hernias noted. Genitalia:  Pelvic Exam:        External: normal female genitalia without lesions or masses        Vagina: normal without lesions or masses        Cervix: none        Adnexa: normal bimanual exam without masses or fullness        Uterus: none        Pap smear: not performed Msk:  No deformity or scoliosis noted of thoracic or lumbar spine.   Pulses:  R and L posterior tibial pulses are full and equal bilaterally  Extremities:  B varicose veins, no edema Skin:  Intact without suspicious lesions or rashes Psych:  Cognition and judgment appear intact. Alert and cooperative with normal attention span and concentration. No apparent delusions, illusions, hallucinations   Impression  & Recommendations:  Problem # 1:  PREVENTIVE HEALTH CARE (ICD-V70.0) The patient's preventative maintenance and recommended screening tests for an annual wellness exam were reviewed in full today. Brought up to date unless services declined.  Counselled on the importance of diet, exercise, and its role in overall health and mortality. The patient's FH and SH was reviewed, including their home life, tobacco status, and drug and alcohol status.     Problem # 2:  FATIGUE (ICD-780.79) Mild.. may be due to grandchildren overtaxing her.  Orders: TLB-CBC Platelet - w/Differential (85025-CBCD) TLB-TSH (Thyroid Stimulating Hormone) (84443-TSH) TLB-B12 + Folate Pnl (13086_57846-N62/XBM)  Problem # 3:  PURE HYPERCHOLESTEROLEMIA (ICD-272.0) Due for reeval.  Orders: TLB-Lipid Panel (80061-LIPID) TLB-BMP (Basic Metabolic Panel-BMET) (80048-METABOL) TLB-Hepatic/Liver Function Pnl (80076-HEPATIC)  Problem # 4:  ROUTINE GYNECOLOGICAL EXAMINATION (ICD-V72.31) DVE, no pap.   Complete Medication List: 1)  Ranitidine Hcl 150 Mg Caps (Ranitidine hcl) .... Take 1 tablet by mouth once a day as needed 2)  Estradiol 1 Mg Tabs (Estradiol) .... Once daily as needed 3)  Alprazolam 0.25 Mg Tabs (Alprazolam) .... Take 2 by mouth daily.  takes maybe 2  times per year if needed. 4)  Calcium Carbonate-vitamin D 600-400 Mg-unit Tabs (Calcium carbonate-vitamin d) .... Takes 2 by mouth daily 5)  Pantoprazole Sodium 40 Mg Tbec (Pantoprazole sodium) .Marland Kitchen.. 1 each day 30 minutes before meal 6)  Fluticasone Propionate 50 Mcg/act Susp (Fluticasone propionate) .... 2 sprays per nostril daily 7)  Chlordiazepoxide Hcl 10 Mg Caps (Chlordiazepoxide hcl) .... Take 1 capsule by mouth two times a day   ? mg. 8)  Zyrtec Allergy 10 Mg Tabs (Cetirizine hcl) .... Take 1 tab by mouth at bedtime 9)  Melatonin 5 Mg Tabs (Melatonin) .... Take 1 tab by mouth at bedtime  Other Orders: Radiology Referral (Radiology) Tdap => 50yrs IM  (54098) Admin 1st Vaccine (11914)  Patient Instructions: 1)  Increase exercsie, healthy eating and work on weight loss. 2)   Referral Appointment Information 3)  Day/Date: 4)  Time: 5)  Place/MD: 6)  Address: 7)  Phone/Fax: 8)  Patient given appointment information. Information/Orders faxed/mailed.  9)  Please schedule a follow-up appointment in 1 year.    Orders Added: 1)  Radiology Referral [Radiology] 2)  TLB-Lipid Panel [80061-LIPID] 3)  TLB-BMP (Basic Metabolic Panel-BMET) [80048-METABOL] 4)  TLB-Hepatic/Liver Function Pnl [80076-HEPATIC] 5)  TLB-CBC Platelet - w/Differential [85025-CBCD] 6)  TLB-TSH (Thyroid Stimulating Hormone) [84443-TSH] 7)  TLB-B12 + Folate Pnl [82746_82607-B12/FOL] 8)  Tdap => 62yrs IM [90715] 9)  Admin 1st Vaccine [90471] 10)  Est. Patient 40-64 years [78295]   Immunizations Administered:  Tetanus Vaccine:    Vaccine Type: Tdap   Immunizations Administered:  Tetanus Vaccine:    Vaccine Type: Tdap  Current Allergies (reviewed today): No known allergies   Last PAP:  DVE normal, no pap (10/27/2009 1:52:14 PM) PAP Result Date:  03/06/2011 PAP Result:  DVE nml , no cervix PAP Next Due:  1 yr    Past Medical History:    Reviewed history from 04/22/2009 and no changes required:       GERD       Irritable Bowel Syndrome       Anxiety       Left eye... addis syndrome  Past Surgical History:    Reviewed history from 03/21/2009 and no changes required:       2008 torn meniscus right knee       2005 carpal tunnel repair, B       Hysterectomy, partial, both ovaries remaining: menorhagia  Appended Document: CPX/CLE  [employer] PT ASKED TO DO LABS SAME DAY AS CPX    Clinical Lists Changes  Orders: Added new Service order of Tdap => 86yrs IM (62130) - Signed Added new Service order of Admin 1st Vaccine (86578) - Signed Observations: Added new observation of TD BOOST VIS: 11/10/08 version given March 06, 2011. (03/06/2011 10:04) Added new  observation of TD BOOSTERLO: [id number] (03/06/2011 10:04) Added new observation of TD BOOST EXP: 11/16/2012 (03/06/2011 10:04) Added new observation of TD BOOSTERBY: Heather Woodard CMA (AAMA) (03/06/2011 10:04) Added new observation of TD BOOSTERRT: IM (03/06/2011 10:04) Added new observation of TDBOOSTERDSE: 0.5 ml (03/06/2011 10:04) Added new observation of TD BOOSTERMF: GlaxoSmithKline (03/06/2011 10:04) Added new observation of TD BOOST SIT: left deltoid (03/06/2011 10:04) Added new observation of TD BOOSTER: Tdap (03/06/2011 10:04)       Immunizations Administered:  Tetanus Vaccine:    Vaccine Type: Tdap    Site: left deltoid    Mfr: GlaxoSmithKline    Dose: 0.5 ml    Route: IM    Given by: Benny Lennert CMA (  AAMA)    Exp. Date: 11/16/2012    Lot #: [id number]    VIS given: 11/10/08 version given March 06, 2011.

## 2011-03-13 NOTE — Letter (Signed)
Summary: Results Follow up Letter  [place] at [facility]  [address] [place], [place] [address]   Phone: [phone]  Fax: [phone]    03/08/2011 MRN: [id number]     [facility] [address] [place], [place]  [address]  [place]     Dear Claire Thomas,  The following are the results of your recent test(s):  Test         Result    Pap Smear:        Normal _____  Not Normal _____ Comments: ______________________________________________________ Cholesterol: LDL(Bad cholesterol):         Your goal is less than:         HDL (Good cholesterol):       Your goal is more than: Comments:  ______________________________________________________ Mammogram:        Normal __x___  Not Normal _____ Comments:Repeat in  year  ___________________________________________________________________ Hemoccult:        Normal _____  Not normal _______ Comments:    _____________________________________________________________________ Other Tests:    We routinely do not discuss normal results over the telephone.  If you desire a copy of the results, or you have any questions about this information we can discuss them at your next office visit.   Sincerely,  Kerby Nora MD

## 2011-03-13 NOTE — Letter (Signed)
Summary: Generic Letter  [name] at [facility]  [address] [place], [place] [address]   Phone: [phone]  Fax: [phone]    03/08/2011  Claire Thomas [address] [place], [place]  [address]  [place]  Dear Claire Thomas,   Notify pt that LDL cholesterol is improved but not at goal <130.. triglycerides are above goal <150. make sure taking at least 2000 mg divided daily of fish oil (DHA and EPA). Recehck in 6  months to 1 year.  Nml thyroid, no anemia, nml liver and kidney, no DM. B12 is low normal.. start daily 1000  micrograms tab of vit B12 to help with energy.           Sincerely Benny Lennert CMA

## 2011-04-11 ENCOUNTER — Telehealth: Payer: Self-pay | Admitting: *Deleted

## 2011-04-11 NOTE — Telephone Encounter (Signed)
You can call and let pt know results... I will look at it on Friday and let her know what ese we need to do.

## 2011-04-11 NOTE — Telephone Encounter (Signed)
Patient called requesting bone density results from 3-20. I did not see results in her chart. Called and requested results, put them on your desk for review.

## 2011-04-15 ENCOUNTER — Telehealth: Payer: Self-pay | Admitting: Family Medicine

## 2011-04-15 DIAGNOSIS — M8580 Other specified disorders of bone density and structure, unspecified site: Secondary | ICD-10-CM

## 2011-04-15 HISTORY — DX: Other specified disorders of bone density and structure, unspecified site: M85.80

## 2011-04-15 LAB — HM DEXA SCAN

## 2011-04-15 NOTE — Telephone Encounter (Signed)
Bone Density Results Reviewed.  Notify pt new diagnosis of osteopenia.  Nees to be getting weight bearing exercise and Ca and Vit D 600/400 BID.  Will recheck in 2 years.

## 2011-04-16 NOTE — Telephone Encounter (Signed)
Patient advised.

## 2011-04-18 ENCOUNTER — Encounter: Payer: Self-pay | Admitting: Family Medicine

## 2012-04-21 ENCOUNTER — Encounter: Payer: Self-pay | Admitting: Internal Medicine

## 2012-08-21 ENCOUNTER — Encounter: Payer: Self-pay | Admitting: Family Medicine

## 2012-08-21 ENCOUNTER — Ambulatory Visit (INDEPENDENT_AMBULATORY_CARE_PROVIDER_SITE_OTHER): Payer: BC Managed Care – PPO | Admitting: Family Medicine

## 2012-08-21 ENCOUNTER — Other Ambulatory Visit (HOSPITAL_COMMUNITY)
Admission: RE | Admit: 2012-08-21 | Discharge: 2012-08-21 | Disposition: A | Discharge status: Home / Self Care | Payer: BC Managed Care – PPO | Source: Ambulatory Visit | Attending: Family Medicine | Admitting: Family Medicine

## 2012-08-21 VITALS — BP 120/67 | HR 57 | Temp 97.6°F | Ht 65.0 in | Wt 184.5 lb

## 2012-08-21 DIAGNOSIS — Z01419 Encounter for gynecological examination (general) (routine) without abnormal findings: Secondary | ICD-10-CM | POA: Insufficient documentation

## 2012-08-21 DIAGNOSIS — Z1151 Encounter for screening for human papillomavirus (HPV): Secondary | ICD-10-CM | POA: Insufficient documentation

## 2012-08-21 DIAGNOSIS — R5381 Other malaise: Secondary | ICD-10-CM

## 2012-08-21 DIAGNOSIS — Z0000 Encounter for general adult medical examination without abnormal findings: Secondary | ICD-10-CM

## 2012-08-21 DIAGNOSIS — R5383 Other fatigue: Secondary | ICD-10-CM

## 2012-08-21 DIAGNOSIS — E7800 Pure hypercholesterolemia, unspecified: Secondary | ICD-10-CM

## 2012-08-21 DIAGNOSIS — R1013 Epigastric pain: Secondary | ICD-10-CM

## 2012-08-21 LAB — COMPREHENSIVE METABOLIC PANEL
ALT: 16 U/L (ref 0–35)
AST: 15 U/L (ref 0–37)
Albumin: 4.1 g/dL (ref 3.5–5.2)
Alkaline Phosphatase: 85 U/L (ref 39–117)
BUN: 17 mg/dL (ref 6–23)
CO2: 28 mEq/L (ref 19–32)
Calcium: 9.2 mg/dL (ref 8.4–10.5)
Chloride: 100 mEq/L (ref 96–112)
Creatinine, Ser: 0.9 mg/dL (ref 0.4–1.2)
GFR: 68.52 mL/min (ref >60.00)
Glucose, Bld: 89 mg/dL (ref 70–99)
Potassium: 3.9 mEq/L (ref 3.5–5.1)
Sodium: 137 mEq/L (ref 135–145)
Total Bilirubin: 0.6 mg/dL (ref 0.3–1.2)
Total Protein: 7.4 g/dL (ref 6.0–8.3)

## 2012-08-21 LAB — CBC WITH DIFFERENTIAL/PLATELET
Basophils Absolute: 0 10*3/uL (ref 0.0–0.1)
Basophils Relative: 0.4 % (ref 0.0–3.0)
Eosinophils Absolute: 0 10*3/uL (ref 0.0–0.7)
Eosinophils Relative: 0.6 % (ref 0.0–5.0)
HCT: 40.1 % (ref 36.0–46.0)
Hemoglobin: 13.2 g/dL (ref 12.0–15.0)
Lymphocytes Relative: 27.9 % (ref 12.0–46.0)
Lymphs Abs: 2.5 10*3/uL (ref 0.7–4.0)
MCHC: 33 g/dL (ref 30.0–36.0)
MCV: 94.3 fl (ref 78.0–100.0)
Monocytes Absolute: 0.5 10*3/uL (ref 0.1–1.0)
Monocytes Relative: 5.9 % (ref 3.0–12.0)
Neutro Abs: 5.8 10*3/uL (ref 1.4–7.7)
Neutrophils Relative %: 65.2 % (ref 43.0–77.0)
Platelets: 330 10*3/uL (ref 150.0–400.0)
RBC: 4.25 Mil/uL (ref 3.87–5.11)
RDW: 12.3 % (ref 11.5–14.6)
WBC: 8.9 10*3/uL (ref 4.5–10.5)

## 2012-08-21 LAB — LIPID PANEL
Cholesterol: 195 mg/dL (ref 0–200)
HDL: 50.6 mg/dL (ref >39.00)
LDL Cholesterol: 117 mg/dL — ABNORMAL HIGH (ref 0–99)
Total CHOL/HDL Ratio: 4
Triglycerides: 139 mg/dL (ref 0.0–149.0)
VLDL: 27.8 mg/dL (ref 0.0–40.0)

## 2012-08-21 LAB — VITAMIN B12: Vitamin B-12: 640 pg/mL (ref 211–911)

## 2012-08-21 LAB — LIPASE: Lipase: 26 U/L (ref 11.0–59.0)

## 2012-08-21 LAB — HM PAP SMEAR: HM Pap smear: NORMAL

## 2012-08-21 LAB — TSH: TSH: 0.65 u[IU]/mL (ref 0.35–5.50)

## 2012-08-21 NOTE — Assessment & Plan Note (Signed)
Most likely gastritis vs PUD. Start PPI x 4-6 weeks, Trigger avoidance. Eval with labs. MAy need Hpylori eval.

## 2012-08-21 NOTE — Patient Instructions (Addendum)
Start omeprazole 2 tabs of 20 mg daily x 4-6 weeks, then taper off. Decrease trigger foods such caffeine, tomatos, citris, alcohol, peppermint, spicy food. We will let you know lab results, but if not better in 2 weeks call. Look into shingles vaccine. Call to schedule mammogram. Call to schedule colonoscopy.

## 2012-08-21 NOTE — Assessment & Plan Note (Signed)
Due for re-eval. 

## 2012-08-21 NOTE — Assessment & Plan Note (Signed)
Eval with labs. 

## 2012-08-21 NOTE — Progress Notes (Signed)
Subjective:    Patient ID: Claire Thomas, female    DOB: [birth year], 61 y.o.   MRN: [id number]  HPI The patient is here for annual wellness exam and preventative care.  \  Elevated Cholesterol:on no medication. Due for re-eval. Lab Results  Component Value Date   CHOL 215* 03/06/2011   HDL 48.60 03/06/2011   LDLCALC 107* 04/24/2010   LDLDIRECT 143.0 03/06/2011   TRIG 160.0* 03/06/2011   CHOLHDL 4 03/06/2011  Diet compliance: Exercise: Other complaints:   Started with cold symptoms in last 2 weeks.. She always has prolongued cough after cold.  In last week she has been having epigastric pain radiating to back. Intermittant pain. Notes in AM when she gets up, cannot time it to mealtime. No change with food. Lasts 1 hour at at or off and on nagging during the day.  Has history of GERD, off and on GERD. Using tums prn.  Fatigue, off and on through the day.  Limited exercise.     Review of Systems  Constitutional: Positive for fatigue. Negative for fever.  HENT: Positive for congestion.   Respiratory: Negative for shortness of breath.   Cardiovascular: Negative for chest pain, palpitations and leg swelling.  Gastrointestinal: Positive for abdominal pain. Negative for diarrhea, constipation and blood in stool.  Genitourinary: Negative for dysuria and hematuria.  Neurological: Negative for syncope and light-headedness.  Psychiatric/Behavioral: Negative for behavioral problems, confusion and dysphoric mood.       Objective:   Physical Exam  Constitutional: Vital signs are normal. She appears well-developed and well-nourished. She is cooperative.  Non-toxic appearance. She does not appear ill. No distress.  HENT:  Head: Normocephalic.  Right Ear: Hearing, tympanic membrane, external ear and ear canal normal.  Left Ear: Hearing, tympanic membrane, external ear and ear canal normal.  Nose: Nose normal.  Eyes: Conjunctivae, EOM and lids are normal. Pupils are equal, round, and  reactive to light. No foreign bodies found.  Neck: Trachea normal and normal range of motion. Neck supple. Carotid bruit is not present. No mass and no thyromegaly present.  Cardiovascular: Normal rate, regular rhythm, S1 normal, S2 normal, normal heart sounds and intact distal pulses.  Exam reveals no gallop.   No murmur heard. Pulmonary/Chest: Effort normal and breath sounds normal. No respiratory distress. She has no wheezes. She has no rhonchi. She has no rales.  Abdominal: Soft. Normal appearance and bowel sounds are normal. She exhibits no distension, no fluid wave, no abdominal bruit and no mass. There is no hepatosplenomegaly. There is no tenderness. There is no rebound, no guarding and no CVA tenderness. No hernia.  Genitourinary: Vagina normal and uterus normal. No breast swelling, tenderness, discharge or bleeding. Pelvic exam was performed with patient prone. There is no rash, tenderness or lesion on the right labia. There is no rash, tenderness or lesion on the left labia. Uterus is not enlarged and not tender. Cervix exhibits no motion tenderness, no discharge and no friability. Right adnexum displays no mass, no tenderness and no fullness. Left adnexum displays no mass, no tenderness and no fullness.       Pap performed.  Lymphadenopathy:    She has no cervical adenopathy.    She has no axillary adenopathy.  Neurological: She is alert. She has normal strength. No cranial nerve deficit or sensory deficit.  Skin: Skin is warm, dry and intact. No rash noted.  Psychiatric: Her speech is normal and behavior is normal. Judgment normal. Her mood appears not anxious.  Cognition and memory are normal. She does not exhibit a depressed mood.          Assessment & Plan:  The patient's preventative maintenance and recommended screening tests for an annual wellness exam were reviewed in full today. Brought up to date unless services declined.  Counselled on the importance of diet, exercise,  and its role in overall health and mortality. The patient's FH and SH was reviewed, including their home life, tobacco status, and drug and alcohol status.   Vaccines: Uptodate with Td, due for shingles  Mammo: 03/08/2011 PAP/DVE: last pap 2010.Marland Kitchen On q 3 year schedule, pap and DVE this year. Colon: Dr. Leone Payor, Procedure date: 05/12/2009  Findings: 1) 10 mm sessile polyp in the cecum, removed ADENOMA  2) Mild diverticulosis in the sigmoid colon  3) Internal hemorrhoids  4) Otherwise normal examination  Repeat colonoscopy in 3 years.  This year.  DEXA:hx of osteopenia, 03/13/11 nml  Nonsmoker:

## 2012-08-29 ENCOUNTER — Encounter: Payer: Self-pay | Admitting: Family Medicine

## 2012-09-04 ENCOUNTER — Encounter: Payer: Self-pay | Admitting: Internal Medicine

## 2012-09-09 ENCOUNTER — Ambulatory Visit: Payer: Self-pay | Admitting: Family Medicine

## 2012-09-11 ENCOUNTER — Encounter: Payer: Self-pay | Admitting: Family Medicine

## 2012-10-09 ENCOUNTER — Ambulatory Visit (AMBULATORY_SURGERY_CENTER): Payer: BC Managed Care – PPO | Admitting: *Deleted

## 2012-10-09 VITALS — Ht 65.0 in | Wt 189.1 lb

## 2012-10-09 DIAGNOSIS — Z86010 Personal history of colonic polyps: Secondary | ICD-10-CM

## 2012-10-09 DIAGNOSIS — Z1211 Encounter for screening for malignant neoplasm of colon: Secondary | ICD-10-CM

## 2012-10-09 MED ORDER — NA SULFATE-K SULFATE-MG SULF 17.5-3.13-1.6 GM/177ML PO SOLN: ORAL | Status: DC

## 2012-10-09 NOTE — Progress Notes (Signed)
No allergies to eggs or soy products 

## 2012-10-23 ENCOUNTER — Ambulatory Visit (AMBULATORY_SURGERY_CENTER): Payer: BC Managed Care – PPO | Admitting: Internal Medicine

## 2012-10-23 ENCOUNTER — Encounter: Payer: Self-pay | Admitting: Internal Medicine

## 2012-10-23 VITALS — BP 145/86 | HR 57 | Temp 97.0°F | Resp 18 | Ht 65.0 in | Wt 169.0 lb

## 2012-10-23 DIAGNOSIS — D126 Benign neoplasm of colon, unspecified: Secondary | ICD-10-CM

## 2012-10-23 DIAGNOSIS — Z1211 Encounter for screening for malignant neoplasm of colon: Secondary | ICD-10-CM

## 2012-10-23 DIAGNOSIS — Z86010 Personal history of colonic polyps: Secondary | ICD-10-CM | POA: Insufficient documentation

## 2012-10-23 MED ORDER — SODIUM CHLORIDE 0.9 % IV SOLN: 500.0000 mL | INTRAVENOUS | Status: DC

## 2012-10-23 NOTE — Progress Notes (Addendum)
Patient did not have preoperative order for IV antibiotic SSI prophylaxis. (G8918)  Patient did not experience any of the following events: a burn prior to discharge; a fall within the facility; wrong site/side/patient/procedure/implant event; or a hospital transfer or hospital admission upon discharge from the facility. (G8907)  

## 2012-10-23 NOTE — Patient Instructions (Addendum)
One tiny polyp was removed. It looks benign. I suspect 5 years for next routine colonoscopy.  Thank you for choosing me and [facility].  Iva Boop, MD, FACG YOU HAD AN ENDOSCOPIC PROCEDURE TODAY AT THE [facility]: Refer to the procedure report that was given to you for any specific questions about what was found during the examination.  If the procedure report does not answer your questions, please call your gastroenterologist to clarify.  If you requested that your care partner not be given the details of your procedure findings, then the procedure report has been included in a sealed envelope for you to review at your convenience later.  YOU SHOULD EXPECT: Some feelings of bloating in the abdomen. Passage of more gas than usual.  Walking can help get rid of the air that was put into your GI tract during the procedure and reduce the bloating. If you had a lower endoscopy (such as a colonoscopy or flexible sigmoidoscopy) you may notice spotting of blood in your stool or on the toilet paper. If you underwent a bowel prep for your procedure, then you may not have a normal bowel movement for a few days.  DIET: Your first meal following the procedure should be a light meal and then it is ok to progress to your normal diet.  A half-sandwich or bowl of soup is an example of a good first meal.  Heavy or fried foods are harder to digest and may make you feel nauseous or bloated.  Likewise meals heavy in dairy and vegetables can cause extra gas to form and this can also increase the bloating.  Drink plenty of fluids but you should avoid alcoholic beverages for 24 hours.  ACTIVITY: Your care partner should take you home directly after the procedure.  You should plan to take it easy, moving slowly for the rest of the day.  You can resume normal activity the day after the procedure however you should NOT DRIVE or use heavy machinery for 24 hours (because of the sedation medicines  used during the test).    SYMPTOMS TO REPORT IMMEDIATELY: A gastroenterologist can be reached at any hour.  During normal business hours, 8:30 AM to 5:00 PM Monday through Friday, call [phone].  After hours and on weekends, please call the GI answering service at [phone] who will take a message and have the physician on call contact you.   Following lower endoscopy (colonoscopy or flexible sigmoidoscopy):  Excessive amounts of blood in the stool  Significant tenderness or worsening of abdominal pains  Swelling of the abdomen that is new, acute  Fever of 100F or higher  FOLLOW UP: If any biopsies were taken you will be contacted by phone or by letter within the next 1-3 weeks.  Call your gastroenterologist if you have not heard about the biopsies in 3 weeks.  Our staff will call the home number listed on your records the next business day following your procedure to check on you and address any questions or concerns that you may have at that time regarding the information given to you following your procedure. This is a courtesy call and so if there is no answer at the home number and we have not heard from you through the emergency physician on call, we will assume that you have returned to your regular daily activities without incident.  SIGNATURES/CONFIDENTIALITY: You and/or your care partner have signed paperwork which will be entered into your electronic medical record.  These signatures attest to the fact that that the information above on your After Visit Summary has been reviewed and is understood.  Full responsibility of the confidentiality of this discharge information lies with you and/or your care-partner.  

## 2012-10-23 NOTE — Progress Notes (Signed)
1026 a/ox3, pleased, report to [employer]

## 2012-10-23 NOTE — Op Note (Signed)
[facility] [address] [place], [address]   COLONOSCOPY PROCEDURE REPORT  PATIENT: Claire, Thomas  MR#: [id number] BIRTHDATE: 1951/08/24 , 61  yrs. old GENDER: Female ENDOSCOPIST: Iva Boop, MD, [facility] PROCEDURE DATE:  10/23/2012 PROCEDURE:   Colonoscopy with snare polypectomy ASA CLASS:   Class II INDICATIONS:screening and surveillance,personal history of colonic polyps and patient's personal history of adenomatous colon polyps.  MEDICATIONS: Propofol (Diprivan) 230 mg IV, MAC sedation, administered by CRNA, and These medications were titrated to patient response per physician's verbal order  DESCRIPTION OF PROCEDURE:   After the risks benefits and alternatives of the procedure were thoroughly explained, informed consent was obtained.  A digital rectal exam revealed no abnormalities of the rectum.   The LB CF-[id number]  endoscope was introduced through the anus and advanced to the cecum, which was identified by both the appendix and ileocecal valve. No adverse events experienced.   The quality of the prep was Suprep excellent The instrument was then slowly withdrawn as the colon was fully examined.      COLON FINDINGS: A diminutive polypoid shaped sessile polyp was found in the sigmoid colon.  A polypectomy was performed with a cold snare.  The resection was complete and the polyp tissue was completely retrieved.   Mild diverticulosis was noted in the sigmoid colon.   Small internal hemorrhoids were found.   The colon mucosa was otherwise normal.  Retroflexed views revealed internal hemorrhoids. The time to cecum=4 minutes 44 seconds.  Withdrawal time=8 minutes 49 seconds.  The scope was withdrawn and the procedure completed. COMPLICATIONS: There were no complications.  ENDOSCOPIC IMPRESSION: 1.   Diminutive sessile polyp was found in the sigmoid colon; polypectomy was performed with a cold snare 2.   Mild diverticulosis was noted in the  sigmoid colon 3.   Small internal hemorrhoids 4.   The colon mucosa was otherwise normal - excellent prep 5. Personal hx 10 mm adenoma 2010  RECOMMENDATIONS: Timing of repeat colonoscopy will be determined by pathology findings.  eSigned:  Iva Boop, MD, [facility] 10/23/2012 10:29 AM   cc: The Patient

## 2012-10-24 ENCOUNTER — Telehealth: Payer: Self-pay | Admitting: *Deleted

## 2012-10-24 NOTE — Telephone Encounter (Signed)
  Follow up Call-  Call back number 10/23/2012  Post procedure Call Back phone  # [phone] cell  Permission to leave phone message Yes     Patient questions:  Do you have a fever, pain , or abdominal swelling? no Pain Score  0 *  Have you tolerated food without any problems? yes  Have you been able to return to your normal activities? yes  Do you have any questions about your discharge instructions: Diet   no Medications  no Follow up visit  no  Do you have questions or concerns about your Care? no  Actions: * If pain score is 4 or above: No action needed, pain <4.

## 2012-10-28 ENCOUNTER — Encounter: Payer: Self-pay | Admitting: Internal Medicine

## 2012-10-28 NOTE — Progress Notes (Signed)
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

## 2013-11-25 ENCOUNTER — Ambulatory Visit (INDEPENDENT_AMBULATORY_CARE_PROVIDER_SITE_OTHER): Payer: Private Health Insurance - Indemnity | Admitting: Family Medicine

## 2013-11-25 ENCOUNTER — Encounter: Payer: Self-pay | Admitting: Family Medicine

## 2013-11-25 VITALS — BP 136/76 | HR 61 | Temp 98.3°F | Wt 183.5 lb

## 2013-11-25 DIAGNOSIS — Z23 Encounter for immunization: Secondary | ICD-10-CM

## 2013-11-25 DIAGNOSIS — M545 Low back pain, unspecified: Secondary | ICD-10-CM

## 2013-11-25 DIAGNOSIS — E669 Obesity, unspecified: Secondary | ICD-10-CM

## 2013-11-25 HISTORY — DX: Obesity, unspecified: E66.9

## 2013-11-25 MED ORDER — CYCLOBENZAPRINE HCL 10 MG PO TABS: 10.0000 mg | ORAL_TABLET | Freq: Every day | ORAL | Status: DC

## 2013-11-25 MED ORDER — DICLOFENAC SODIUM 75 MG PO TBEC: 75.0000 mg | DELAYED_RELEASE_TABLET | Freq: Two times a day (BID) | ORAL | Status: DC

## 2013-11-25 NOTE — Progress Notes (Signed)
Pre-visit discussion using our clinic review tool. No additional management support is needed unless otherwise documented below in the visit note.  

## 2013-11-25 NOTE — Progress Notes (Signed)
Patient Name: Claire Thomas Date of Birth: 1951-02-01 Medical Record Number: [id number] Gender: female  PCP: Kerby Nora, MD  History of Present Illness:  Claire Thomas is a 62 y.o. very pleasant female patient who presents with the following: Back Pain  ongoing for approximately: 1 week The patient has had back pain before. The back pain is localized into the lumbar spine area. They also describe no radiculopathy. Left side, a sore place, and sore to touch and across her lower back. Mostly is at night and it will hurt a lot. Doing the day it will hurt morstly on the left side.  Not done anything.  No trauma No back hx  No numbness or tingling. No bowel or bladder incontinence. No focal weakness. Prior interventions: nsaids Physical therapy: No Chiropractic manipulations: No Acupuncture: No Osteopathic manipulation: No  Past Medical History, Surgical History, Family History, Medications, Allergies have been reviewed and updated if relevant.  Review of Systems  GEN: No fevers, chills. Nontoxic. Primarily MSK c/o today. MSK: Detailed in the HPI GI: tolerating PO intake without difficulty Neuro: As above  Otherwise the pertinent positives of the ROS are noted above.    Physical Exam  Filed Vitals:   11/25/13 0813  BP: 136/76  Pulse: 61  Temp: 98.3 F (36.8 C)  TempSrc: Oral  Weight: 183 lb 8 oz (83.235 kg)  SpO2: 96%    Gen: Well-developed,well-nourished,in no acute distress; alert,appropriate and cooperative throughout examination HEENT: Normocephalic and atraumatic without obvious abnormalities.  Ears, externally no deformities Pulm: Breathing comfortably in no respiratory distress Range of motion at  the waist: Flexion, rotation and lateral bending: relatively preserved  No echymosis or edema Rises to examination table with no difficulty Gait: minimally antalgic  Inspection/Deformity: No abnormality Paraspinus T:  L paraspinus and oblique  B Ankle  Dorsiflexion (L5,4): 5/5 B Great Toe Dorsiflexion (L5,4): 5/5 Heel Walk (L5): WNL Toe Walk (S1): WNL Rise/Squat (L4): WNL, mild pain  SENSORY B Medial Foot (L4): WNL B Dorsum (L5): WNL B Lateral (S1): WNL Light Touch: WNL Pinprick: WNL  REFLEXES Knee (L4): 2+ Ankle (S1): 2+  B SLR, seated: neg B SLR, supine: neg B FABER: neg B Reverse FABER: neg B Greater Troch: NT B Log Roll: neg B Stork: NT B Sciatic Notch: NT   Assessment and Plan:  Back pain. Really more L oblique strain.  I reviewed with the patient the structures involved and how they related to diagnosis.   Conservative algorithms for acute back pain generally begin with the following: NSAIDS Muscle Relaxants Mild pain medication  Start with medications, core rehab, and progress from there following low back pain algorithm. No red flags are present.    Low back pain  Patient Instructions  Back pain:  Suggest moist heat: like a low level heating pad You can also use a water bottle Or a warm moist towel  A warm bath can often help Or a warm shower.  Massage is very helpful with back pain associated with muscle pain. If you have someone who lives with you who could give you a back massage, it would be a good idea.    Orders Today:  Orders Placed This Encounter  Procedures  . Flu Vaccine QUAD 36+ mos IM    New medications, updates to list, dose adjustments: Meds ordered this encounter  Medications  . diclofenac (VOLTAREN) 75 MG EC tablet    Sig: Take 1 tablet (75 mg total) by mouth 2 (two) times  daily.    Dispense:  60 tablet    Refill:  3  . cyclobenzaprine (FLEXERIL) 10 MG tablet    Sig: Take 1 tablet (10 mg total) by mouth at bedtime.    Dispense:  30 tablet    Refill:  1    Signed,  Carlas Vandyne T. Devontae Casasola, MD, CAQ Sports Medicine  [facility] [address] [place] [address] Phone: [phone] Fax: [phone]  Updated Complete Medication List:     Medication List       This list is accurate as of: 11/25/13 11:59 PM.  Always use your most recent med list.               Calcium-Vitamin D 600-200 MG-UNIT per tablet  Take 1 tablet by mouth 2 (two) times daily.     cyclobenzaprine 10 MG tablet  Commonly known as:  FLEXERIL  Take 1 tablet (10 mg total) by mouth at bedtime.     diclofenac 75 MG EC tablet  Commonly known as:  VOLTAREN  Take 1 tablet (75 mg total) by mouth 2 (two) times daily.     ranitidine 150 MG tablet  Commonly known as:  ZANTAC  Take 150 mg by mouth 2 (two) times daily.

## 2013-11-25 NOTE — Patient Instructions (Signed)
Back pain:  Suggest moist heat: like a low level heating pad You can also use a water bottle Or a warm moist towel  A warm bath can often help Or a warm shower.  Massage is very helpful with back pain associated with muscle pain. If you have someone who lives with you who could give you a back massage, it would be a good idea.  

## 2014-03-04 ENCOUNTER — Encounter: Payer: Self-pay | Admitting: Family Medicine

## 2014-03-04 ENCOUNTER — Ambulatory Visit (INDEPENDENT_AMBULATORY_CARE_PROVIDER_SITE_OTHER): Payer: Private Health Insurance - Indemnity | Admitting: Family Medicine

## 2014-03-04 VITALS — BP 171/84 | HR 61 | Temp 98.1°F | Ht 64.25 in | Wt 189.0 lb

## 2014-03-04 DIAGNOSIS — E7800 Pure hypercholesterolemia, unspecified: Secondary | ICD-10-CM

## 2014-03-04 DIAGNOSIS — R202 Paresthesia of skin: Secondary | ICD-10-CM

## 2014-03-04 DIAGNOSIS — I10 Essential (primary) hypertension: Secondary | ICD-10-CM | POA: Insufficient documentation

## 2014-03-04 DIAGNOSIS — R609 Edema, unspecified: Secondary | ICD-10-CM

## 2014-03-04 DIAGNOSIS — R030 Elevated blood-pressure reading, without diagnosis of hypertension: Secondary | ICD-10-CM

## 2014-03-04 DIAGNOSIS — R600 Localized edema: Secondary | ICD-10-CM

## 2014-03-04 DIAGNOSIS — M25539 Pain in unspecified wrist: Secondary | ICD-10-CM

## 2014-03-04 DIAGNOSIS — R7989 Other specified abnormal findings of blood chemistry: Secondary | ICD-10-CM

## 2014-03-04 DIAGNOSIS — Z0000 Encounter for general adult medical examination without abnormal findings: Secondary | ICD-10-CM

## 2014-03-04 DIAGNOSIS — H9319 Tinnitus, unspecified ear: Secondary | ICD-10-CM

## 2014-03-04 DIAGNOSIS — K219 Gastro-esophageal reflux disease without esophagitis: Secondary | ICD-10-CM

## 2014-03-04 DIAGNOSIS — H9313 Tinnitus, bilateral: Secondary | ICD-10-CM | POA: Insufficient documentation

## 2014-03-04 DIAGNOSIS — R200 Anesthesia of skin: Secondary | ICD-10-CM

## 2014-03-04 DIAGNOSIS — M25532 Pain in left wrist: Secondary | ICD-10-CM | POA: Insufficient documentation

## 2014-03-04 DIAGNOSIS — R799 Abnormal finding of blood chemistry, unspecified: Secondary | ICD-10-CM

## 2014-03-04 DIAGNOSIS — R209 Unspecified disturbances of skin sensation: Secondary | ICD-10-CM

## 2014-03-04 HISTORY — DX: Edema, unspecified: R60.9

## 2014-03-04 HISTORY — DX: Localized edema: R60.0

## 2014-03-04 HISTORY — DX: Tinnitus, bilateral: H93.13

## 2014-03-04 LAB — COMPREHENSIVE METABOLIC PANEL
ALT: 23 U/L (ref 0–35)
AST: 24 U/L (ref 0–37)
Albumin: 4.5 g/dL (ref 3.5–5.2)
Alkaline Phosphatase: 87 U/L (ref 39–117)
BUN: 14 mg/dL (ref 6–23)
CO2: 31 mEq/L (ref 19–32)
Calcium: 9.5 mg/dL (ref 8.4–10.5)
Chloride: 101 mEq/L (ref 96–112)
Creatinine, Ser: 0.8 mg/dL (ref 0.4–1.2)
GFR: 79.39 mL/min (ref >60.00)
Glucose, Bld: 92 mg/dL (ref 70–99)
Potassium: 4.2 mEq/L (ref 3.5–5.1)
Sodium: 138 mEq/L (ref 135–145)
Total Bilirubin: 0.8 mg/dL (ref 0.3–1.2)
Total Protein: 7.4 g/dL (ref 6.0–8.3)

## 2014-03-04 LAB — CBC WITH DIFFERENTIAL/PLATELET
Basophils Absolute: 0 10*3/uL (ref 0.0–0.1)
Basophils Relative: 0.4 % (ref 0.0–3.0)
Eosinophils Absolute: 0.1 10*3/uL (ref 0.0–0.7)
Eosinophils Relative: 0.6 % (ref 0.0–5.0)
HCT: 40 % (ref 36.0–46.0)
Hemoglobin: 13.4 g/dL (ref 12.0–15.0)
Lymphocytes Relative: 24.5 % (ref 12.0–46.0)
Lymphs Abs: 1.9 10*3/uL (ref 0.7–4.0)
MCHC: 33.5 g/dL (ref 30.0–36.0)
MCV: 93.6 fl (ref 78.0–100.0)
Monocytes Absolute: 0.7 10*3/uL (ref 0.1–1.0)
Monocytes Relative: 8.2 % (ref 3.0–12.0)
Neutro Abs: 5.3 10*3/uL (ref 1.4–7.7)
Neutrophils Relative %: 66.3 % (ref 43.0–77.0)
Platelets: 281 10*3/uL (ref 150.0–400.0)
RBC: 4.28 Mil/uL (ref 3.87–5.11)
RDW: 13.2 % (ref 11.5–14.6)
WBC: 8 10*3/uL (ref 4.5–10.5)

## 2014-03-04 LAB — LIPID PANEL
Cholesterol: 224 mg/dL — ABNORMAL HIGH (ref 0–200)
HDL: 56.8 mg/dL (ref >39.00)
LDL Cholesterol: 137 mg/dL — ABNORMAL HIGH (ref 0–99)
Total CHOL/HDL Ratio: 4
Triglycerides: 151 mg/dL — ABNORMAL HIGH (ref 0.0–149.0)
VLDL: 30.2 mg/dL (ref 0.0–40.0)

## 2014-03-04 LAB — TSH: TSH: 0.91 u[IU]/mL (ref 0.35–5.50)

## 2014-03-04 LAB — BRAIN NATRIURETIC PEPTIDE: Pro B Natriuretic peptide (BNP): 25 pg/mL (ref 0.0–100.0)

## 2014-03-04 LAB — VITAMIN B12: Vitamin B-12: 313 pg/mL (ref 211–911)

## 2014-03-04 NOTE — Assessment & Plan Note (Addendum)
Follow at home. If remains elevated at next OV,start medication. Encouraged exercise, weight loss, healthy eating habits.  Will eval with labs.  Next OV EKG etc. In 2 weeks.

## 2014-03-04 NOTE — Assessment & Plan Note (Signed)
Due for re-eval. 

## 2014-03-04 NOTE — Patient Instructions (Addendum)
Follow BP at home in the next 2 weeks. Goal < 140/90. Stop at the lab on way out. Follow up in 2 weeks for BP check. Work on [employer] for reflux. Work on weight loss and exercise and healthy eating. Prilosec 2 tabs of 20 mg daily x 4-6 week. Keep appt for mammogram

## 2014-03-04 NOTE — Assessment & Plan Note (Signed)
Poor control. Discussed trigger avoidance. Start course of prilosec 40 mg daily x 4-6 weeks.

## 2014-03-04 NOTE — Progress Notes (Addendum)
The patient is here for annual wellness exam and preventative care.   She has noted B foot swelling in last 24 , red splotches on dorsum. Mild improvement overnight.  In last month she has noted some pain/ tingling in sides of both legs. Stinging. No numbness.  Occ edema peripherally off and on.  At home BP last night 440 systolic BP Readings from Last 3 Encounters:  03/04/14 171/84  11/25/13 136/76  10/23/12 145/86  No headache, no vision change.   She has been having severe heartburn and indigestion after meals.   Food and acid coming up in throat. No trouble swallowing.  She has epigastric pain radiates to back.  Ongoing x 1 month, In last few week more frequent, now daily. Using zantac , helps minimally.   She has been having night sweats. Post menopausal.  Was on estradiol during the day.  2 months ago.. Lost balnce and fell on left hand. Forcibly plantar flexed.  Much better. Still occasionally swollen and pain.   Wt Readings from Last 3 Encounters:  03/04/14 189 lb (85.73 kg)  11/25/13 183 lb 8 oz (83.235 kg)  10/23/12 169 lb (76.658 kg)    Elevated Cholesterol:on no medication.  Due for re-eval. Lab Results  Component Value Date   CHOL 195 08/21/2012   HDL 50.60 08/21/2012   LDLCALC 117* 08/21/2012   LDLDIRECT 143.0 03/06/2011   TRIG 139.0 08/21/2012   CHOLHDL 4 08/21/2012  Diet compliance:  Exercise:  Other complaints:   Review of Systems  Constitutional: positive for fatigue. Negative for fever.  HENT: Neg for congestion.  Ringing in ears, both. No pulsatile sound. Respiratory: Negative for shortness of breath.  Cardiovascular: Negative for chest pain, palpitations and leg swelling.  Gastrointestinal: Negative for abdominal pain. Negative for diarrhea, constipation and blood in stool.  Genitourinary: Negative for dysuria and hematuria.  Neurological: Negative for syncope and light-headedness.  Psychiatric/Behavioral: Negative for behavioral problems,  confusion and dysphoric mood.  Objective:   Physical Exam  Constitutional: Vital signs are normal. She appears well-developed and well-nourished. She is cooperative. Non-toxic appearance. She does not appear ill. No distress.  HENT:  Head: Normocephalic.  Right Ear: Hearing, tympanic membrane, external ear and ear canal normal.  Left Ear: Hearing, tympanic membrane, external ear and ear canal normal.  Nose: Nose normal.  Eyes: Conjunctivae, EOM and lids are normal. Pupils are equal, round, and reactive to light. No foreign bodies found.  Neck: Trachea normal and normal range of motion. Neck supple. Carotid bruit is not present. No mass and no thyromegaly present.  Cardiovascular: Normal rate, regular rhythm, S1 normal, S2 normal, normal heart sounds and intact distal pulses. Exam reveals no gallop.  No murmur heard.  1 plus pitting edema Pulmonary/Chest: Effort normal and breath sounds normal. No respiratory distress. She has no wheezes. She has no rhonchi. She has no rales.  Abdominal: Soft. Normal appearance and bowel sounds are normal. She exhibits no distension, no fluid wave, no abdominal bruit and no mass. There is no hepatosplenomegaly. There is no tenderness. There is no rebound, no guarding and no CVA tenderness. No hernia.  Genitourinary: Vagina normal and uterus normal. No breast swelling, tenderness, discharge or bleeding. Pelvic exam was performed with patient prone. There is no rash, tenderness or lesion on the right labia. There is no rash, tenderness or lesion on the left labia. Uterus is not enlarged and not tender. Cervix exhibits no motion tenderness, no discharge and no friability. Right adnexum displays no  mass, no tenderness and no fullness. Left adnexum displays no mass, no tenderness and no fullness.  Pap performed.  Lymphadenopathy:  She has no cervical adenopathy.  She has no axillary adenopathy.  Neurological: She is alert. She has normal strength. No cranial nerve  deficit or sensory deficit.  Skin: Skin is warm, dry and intact.  In legs B pinpoint petichia likely from edema. Psychiatric: Her speech is normal and behavior is normal. Judgment normal. Her mood appears not anxious. Cognition and memory are normal. She does not exhibit a depressed mood.  Assessment & Plan:   The patient's preventative maintenance and recommended screening tests for an annual wellness exam were reviewed in full today.  Brought up to date unless services declined.  Counselled on the importance of diet, exercise, and its role in overall health and mortality.  The patient's FH and SH was reviewed, including their home life, tobacco status, and drug and alcohol status.   Vaccines: Uptodate with Td,shingles  Mammo: 08/2012 nml, due. PAP/DVE: 07/2012 On q 3 year schedule,  DVE  Only this year.  Colon: Dr. Carlean Purl, 09/2012, repeat due 09/2017 DEXA:hx of osteopenia, 03/13/11 nml  Nonsmoker:

## 2014-03-04 NOTE — Progress Notes (Signed)
Pre visit review using our clinic review tool, if applicable. No additional management support is needed unless otherwise documented below in the visit note. 

## 2014-03-04 NOTE — Assessment & Plan Note (Signed)
Will eval with labs... May be due to BP.

## 2014-03-05 ENCOUNTER — Telehealth: Payer: Self-pay | Admitting: Family Medicine

## 2014-03-05 MED ORDER — ESTRADIOL 0.5 MG PO TABS: 0.5000 mg | ORAL_TABLET | Freq: Every day | ORAL | Status: DC

## 2014-03-05 NOTE — Telephone Encounter (Signed)
Message copied by Jinny Sanders on Fri Mar 05, 2014  8:24 AM ------      Message from: Hedy Camara S      Created: Thu Mar 04, 2014  5:34 PM       Patient notified as instructed by telephone.  Ask if Dr. Diona Browner by chance sent in a prescription for estradiol for the night sweats.  She said you talked about it but then got involved with blood pressure issue.  Please advise. ------

## 2014-03-05 NOTE — Telephone Encounter (Signed)
Patient notified as instructed by telephone. 

## 2014-03-05 NOTE — Telephone Encounter (Signed)
She mentioned it but did not discuss in detail.I will send in rx for estradiol as she was doing better on this in the past. Make sure she understands that with any oral estrogen there has been shown an increase in cardiovascular disease.. If she wishes to talk about this risk more prior to starting, she can hold off on starting med until 2 week follow up.

## 2014-03-11 ENCOUNTER — Ambulatory Visit: Payer: Self-pay | Admitting: Family Medicine

## 2014-03-11 ENCOUNTER — Encounter: Payer: Self-pay | Admitting: Family Medicine

## 2014-03-18 ENCOUNTER — Encounter: Payer: Self-pay | Admitting: Family Medicine

## 2014-03-18 ENCOUNTER — Ambulatory Visit (INDEPENDENT_AMBULATORY_CARE_PROVIDER_SITE_OTHER): Payer: Private Health Insurance - Indemnity | Admitting: Family Medicine

## 2014-03-18 VITALS — BP 130/86 | HR 60 | Temp 97.9°F | Ht 64.25 in | Wt 189.5 lb

## 2014-03-18 DIAGNOSIS — K219 Gastro-esophageal reflux disease without esophagitis: Secondary | ICD-10-CM

## 2014-03-18 DIAGNOSIS — R609 Edema, unspecified: Secondary | ICD-10-CM

## 2014-03-18 DIAGNOSIS — R109 Unspecified abdominal pain: Secondary | ICD-10-CM

## 2014-03-18 DIAGNOSIS — R030 Elevated blood-pressure reading, without diagnosis of hypertension: Secondary | ICD-10-CM

## 2014-03-18 LAB — POCT URINALYSIS DIPSTICK
Bilirubin, UA: NEGATIVE
Blood, UA: NEGATIVE
Glucose, UA: NEGATIVE
Ketones, UA: NEGATIVE
Leukocytes, UA: NEGATIVE
Nitrite, UA: NEGATIVE
Protein, UA: NEGATIVE
Spec Grav, UA: 1.01
Urobilinogen, UA: 0.2
pH, UA: 7

## 2014-03-18 MED ORDER — VALSARTAN-HYDROCHLOROTHIAZIDE 80-12.5 MG PO TABS: 1.0000 | ORAL_TABLET | Freq: Every day | ORAL | Status: DC

## 2014-03-18 NOTE — Patient Instructions (Addendum)
Start losartan HCTZ 1 tab daily. Follow BP at home. Stop prilosec and change to nexium 40 mg daily. Start estradiol. Follow up in 2 weeks 30 min OV.

## 2014-03-18 NOTE — Assessment & Plan Note (Signed)
Likely due to increase BP. Will start HCTZ.

## 2014-03-18 NOTE — Progress Notes (Signed)
Pre visit review using our clinic review tool, if applicable. No additional management support is needed unless otherwise documented below in the visit note. 

## 2014-03-18 NOTE — Assessment & Plan Note (Signed)
UA clear. ? Related to GERD vs MSK strain vs other.

## 2014-03-18 NOTE — Assessment & Plan Note (Addendum)
IMproved some but not resolved with prilosec  40 x 2 weeks.  Change to nexium. THis may be causing referred pain to flank as well.

## 2014-03-18 NOTE — Progress Notes (Signed)
   Subjective:    Patient ID: Claire Thomas, female    DOB: 1951-05-24, 63 y.o.   MRN: [id number]  HPI 63 year old female presents for 2 weeks follow up on elevated BPs. She does not have dx of HTN.  BP Readings from Last 3 Encounters:  03/18/14 130/86  03/04/14 171/84  11/25/13 136/76    At home. 370-488/89-16. Occ chest pressure, random, not associated. She gets out of breath more easily, but this is stable for her. Mild headache.    She has continued to have epigastric pain, but it is better. Ongoing x 1-2 months.  She has had improvement in reflux with prilosec40 mg daily. She is making diet change changes to avoid triggers.  More bothersome is pain in left flank, sometimes associated, but not always. Left flank pain not related to eating.  No fall, or injury. No dysuria.  treated for low back pain, muscle strain in 11/2013 by Dr. Lorelei Pont.  She reports that her hot flashes  Are very bothersome. Have been ongoing since she stopped estradiol in past.  Night-time symptoms very bothersome.  She would like to restart estradiol.    Review of Systems     Objective:   Physical Exam        Assessment & Plan:

## 2014-03-18 NOTE — Assessment & Plan Note (Signed)
Start BP med, return for recehck BP and Cr in 2 weeks. Encouraged exercise, weight loss, healthy eating habits.

## 2014-04-01 ENCOUNTER — Ambulatory Visit (INDEPENDENT_AMBULATORY_CARE_PROVIDER_SITE_OTHER): Payer: Private Health Insurance - Indemnity | Admitting: Family Medicine

## 2014-04-01 ENCOUNTER — Telehealth: Payer: Self-pay | Admitting: Family Medicine

## 2014-04-01 ENCOUNTER — Encounter: Payer: Self-pay | Admitting: Family Medicine

## 2014-04-01 VITALS — BP 130/90 | HR 64 | Temp 97.4°F | Ht 64.25 in | Wt 187.2 lb

## 2014-04-01 DIAGNOSIS — R609 Edema, unspecified: Secondary | ICD-10-CM

## 2014-04-01 DIAGNOSIS — R109 Unspecified abdominal pain: Secondary | ICD-10-CM

## 2014-04-01 DIAGNOSIS — K219 Gastro-esophageal reflux disease without esophagitis: Secondary | ICD-10-CM

## 2014-04-01 DIAGNOSIS — I10 Essential (primary) hypertension: Secondary | ICD-10-CM

## 2014-04-01 LAB — BASIC METABOLIC PANEL
BUN: 20 mg/dL (ref 6–23)
CO2: 29 mEq/L (ref 19–32)
Calcium: 9.5 mg/dL (ref 8.4–10.5)
Chloride: 100 mEq/L (ref 96–112)
Creatinine, Ser: 0.8 mg/dL (ref 0.4–1.2)
GFR: 77.08 mL/min (ref >60.00)
Glucose, Bld: 92 mg/dL (ref 70–99)
Potassium: 4.5 mEq/L (ref 3.5–5.1)
Sodium: 138 mEq/L (ref 135–145)

## 2014-04-01 NOTE — Progress Notes (Signed)
   Subjective:    Patient ID: Claire Thomas, female    DOB: [birth year], 63 y.o.   MRN: [id number]  Hypertension Pertinent negatives include no chest pain, palpitations or shortness of breath.   63 year old female presents for 2 week follow up of HTN.  She was started on ARB/ diuretic at last OV.   She has not had any problems or SE to this new med.  No chest pain  Swelling resolved.  At home BPs have been much better 115-128/68-78. BP Readings from Last 3 Encounters:  04/01/14 130/90  03/18/14 130/86  03/04/14 171/84    GERD, flank pain:  She is trying to avoid triggers, she has not yet started nexium. Making diet changes has helped significantly.   She is still having some left sided flank pain over rib cage. 2-4/10 on pain scale, off and on, sore to touch, wakes her up if she rolls over.  No pleuritic component. UA nml last OV.  Nonsmoker. No family history of renal issues. No known injury, and no change with lifting things.   Review of Systems  Constitutional: Negative for fever and fatigue.  HENT: Negative for ear pain.   Eyes: Negative for pain.  Respiratory: Negative for chest tightness and shortness of breath.   Cardiovascular: Negative for chest pain, palpitations and leg swelling.  Gastrointestinal: Negative for abdominal pain.  Genitourinary: Negative for dysuria.       Objective:   Physical Exam  Constitutional: Vital signs are normal. She appears well-developed and well-nourished. She is cooperative.  Non-toxic appearance. She does not appear ill. No distress.  HENT:  Head: Normocephalic.  Right Ear: Hearing, tympanic membrane, external ear and ear canal normal. Tympanic membrane is not erythematous, not retracted and not bulging.  Left Ear: Hearing, tympanic membrane, external ear and ear canal normal. Tympanic membrane is not erythematous, not retracted and not bulging.  Nose: No mucosal edema or rhinorrhea. Right sinus exhibits no maxillary sinus  tenderness and no frontal sinus tenderness. Left sinus exhibits no maxillary sinus tenderness and no frontal sinus tenderness.  Mouth/Throat: Uvula is midline, oropharynx is clear and moist and mucous membranes are normal.  Eyes: Conjunctivae, EOM and lids are normal. Pupils are equal, round, and reactive to light. Lids are everted and swept, no foreign bodies found.  Neck: Trachea normal and normal range of motion. Neck supple. Carotid bruit is not present. No mass and no thyromegaly present.  Cardiovascular: Normal rate, regular rhythm, S1 normal, S2 normal, normal heart sounds, intact distal pulses and normal pulses.  Exam reveals no gallop and no friction rub.   No murmur heard. Pulmonary/Chest: Effort normal and breath sounds normal. Not tachypneic. No respiratory distress. She has no decreased breath sounds. She has no wheezes. She has no rhonchi. She has no rales.  Abdominal: Soft. Normal appearance and bowel sounds are normal. There is no hepatosplenomegaly. There is no tenderness. There is CVA tenderness.  Neurological: She is alert.  Skin: Skin is warm, dry and intact. No rash noted.  Psychiatric: Her speech is normal and behavior is normal. Judgment and thought content normal. Her mood appears not anxious. Cognition and memory are normal. She does not exhibit a depressed mood.          Assessment & Plan:

## 2014-04-01 NOTE — Patient Instructions (Signed)
Continue BP med. Follow BP at home.  Work on [employer], exercise and weight loss.  Stop at front desk for renal US.

## 2014-04-01 NOTE — Assessment & Plan Note (Signed)
Possible MSK strain  but over CVA and ongoing x many months... eval with renal US. Considered CXR but no breathing issues or pleuritic component in this nonsmoker.

## 2014-04-01 NOTE — Assessment & Plan Note (Signed)
Well controlled. Continue current medication.  

## 2014-04-01 NOTE — Progress Notes (Signed)
Pre visit review using our clinic review tool, if applicable. No additional management support is needed unless otherwise documented below in the visit note. 

## 2014-04-01 NOTE — Assessment & Plan Note (Signed)
Improved control with diet changes. 

## 2014-04-01 NOTE — Telephone Encounter (Signed)
Relevant patient education assigned to patient using Emmi. ° °

## 2014-04-01 NOTE — Assessment & Plan Note (Signed)
Resolved with improvement in BP.

## 2014-04-06 ENCOUNTER — Telehealth: Payer: Self-pay | Admitting: *Deleted

## 2014-04-06 ENCOUNTER — Ambulatory Visit: Payer: Self-pay | Admitting: Family Medicine

## 2014-04-06 ENCOUNTER — Encounter: Payer: Self-pay | Admitting: Family Medicine

## 2014-04-06 NOTE — Telephone Encounter (Signed)
Ms. Scullin notified renal US is normal. If pain not improving in next few weeks with gentle stretching, heat and anti-inflammatory, if tolerated... Let us know.

## 2014-06-14 ENCOUNTER — Emergency Department: Payer: Self-pay | Admitting: Emergency Medicine

## 2014-06-14 LAB — COMPREHENSIVE METABOLIC PANEL
Albumin: 4.1 g/dL (ref 3.4–5.0)
Alkaline Phosphatase: 94 U/L
Anion Gap: 6 — ABNORMAL LOW (ref 7–16)
BUN: 24 mg/dL — ABNORMAL HIGH (ref 7–18)
Bilirubin,Total: 0.4 mg/dL (ref 0.2–1.0)
Calcium, Total: 9.4 mg/dL (ref 8.5–10.1)
Chloride: 101 mmol/L (ref 98–107)
Co2: 30 mmol/L (ref 21–32)
Creatinine: 0.92 mg/dL (ref 0.60–1.30)
EGFR (African American): >60
EGFR (Non-African Amer.): >60
Glucose: 96 mg/dL (ref 65–99)
Osmolality: 278 (ref 275–301)
Potassium: 4.2 mmol/L (ref 3.5–5.1)
SGOT(AST): 18 U/L (ref 15–37)
SGPT (ALT): 21 U/L (ref 12–78)
Sodium: 137 mmol/L (ref 136–145)
Total Protein: 7.4 g/dL (ref 6.4–8.2)

## 2014-06-14 LAB — CBC
HCT: 40.3 % (ref 35.0–47.0)
HGB: 13.7 g/dL (ref 12.0–16.0)
MCH: 31.8 pg (ref 26.0–34.0)
MCHC: 34 g/dL (ref 32.0–36.0)
MCV: 94 fL (ref 80–100)
Platelet: 289 10*3/uL (ref 150–440)
RBC: 4.3 10*6/uL (ref 3.80–5.20)
RDW: 12.9 % (ref 11.5–14.5)
WBC: 8.5 10*3/uL (ref 3.6–11.0)

## 2014-06-14 LAB — TROPONIN I: Troponin-I: <0.02 ng/mL

## 2014-06-15 ENCOUNTER — Telehealth: Payer: Self-pay | Admitting: *Deleted

## 2014-06-15 NOTE — Telephone Encounter (Signed)
LVM for patient to call and set up appt with Dr. Fletcher Anon this week for Chest Pain.

## 2014-06-15 NOTE — Telephone Encounter (Signed)
Patient scheduled 06/18/14

## 2014-06-18 ENCOUNTER — Ambulatory Visit (INDEPENDENT_AMBULATORY_CARE_PROVIDER_SITE_OTHER): Payer: Private Health Insurance - Indemnity | Admitting: Cardiovascular Disease

## 2014-06-18 ENCOUNTER — Encounter (INDEPENDENT_AMBULATORY_CARE_PROVIDER_SITE_OTHER): Payer: Self-pay

## 2014-06-18 ENCOUNTER — Encounter: Payer: Self-pay | Admitting: Cardiovascular Disease

## 2014-06-18 VITALS — BP 140/96 | HR 63 | Ht 66.0 in | Wt 188.8 lb

## 2014-06-18 DIAGNOSIS — R0789 Other chest pain: Secondary | ICD-10-CM

## 2014-06-18 DIAGNOSIS — K219 Gastro-esophageal reflux disease without esophagitis: Secondary | ICD-10-CM

## 2014-06-18 DIAGNOSIS — I10 Essential (primary) hypertension: Secondary | ICD-10-CM

## 2014-06-18 HISTORY — DX: Other chest pain: R07.89

## 2014-06-18 NOTE — Assessment & Plan Note (Signed)
Some of her symptoms are suggestive of GERD. She is already on and omeprazole. Esophageal spasm cannot be excluded.

## 2014-06-18 NOTE — Progress Notes (Signed)
Primary care physician: Dr. Diona Browner  HPI  This is a pleasant 63 year old female who was referred from the emergency room at [facility] for evaluation of chest pain. She has no previous cardiac history. She has known history of hypertension, hyperlipidemia and gastroesophageal reflux disease. She has no diabetes and does not smoke. There is family history of coronary artery disease but not prematurely. She went to urgent care recently for chest pain and was sent to the emergency room. She has been having substernal chest tightness radiating to the left side of neck with numbness and left arm. This usually lasts for a few minutes but occasionally can last for hours. Typically this happens at rest and does not worsen with physical activities. She does complain of dyspnea with minimal activities which has been chronic. She also describes epigastric discomfort which according to her is consistent with symptoms of reflux. In the emergency room, ECG was unremarkable, labs were within normal limits including negative cardiac enzymes. Chest x-ray showed no active cardiopulmonary disease.  Not on File   Current Outpatient Prescriptions on File Prior to Visit  Medication Sig Dispense Refill  . Calcium-Vitamin D 600-200 MG-UNIT per tablet Take 1 tablet by mouth 2 (two) times daily.        . valsartan-hydrochlorothiazide (DIOVAN-HCT) 80-12.5 MG per tablet Take 1 tablet by mouth daily.  30 tablet  11   No current facility-administered medications on file prior to visit.     Past Medical History  Diagnosis Date  . GERD (gastroesophageal reflux disease)   . Hypertension      Past Surgical History  Procedure Laterality Date  . Vaginal hysterectomy    . Colonoscopy    . Knee surgery      left  . Carpal tunnel release      both     Family History  Problem Relation Age of Onset  . Colon cancer Mother   . Hyperlipidemia Mother   . Hypertension Mother   . Esophageal cancer Neg Hx   . Stomach cancer  Neg Hx   . Rectal cancer Neg Hx   . AAA (abdominal aortic aneurysm) Father   . Hyperlipidemia Father   . Hypertension Father   . Heart disease Father      History   Social History  . Marital Status: Married    Spouse Name: N/A    Number of Children: N/A  . Years of Education: N/A   Occupational History  . Not on file.   Social History Main Topics  . Smoking status: Never Smoker   . Smokeless tobacco: Never Used  . Alcohol Use: No     Comment: occassioinal  . Drug Use: No  . Sexual Activity: Not on file   Other Topics Concern  . Not on file   Social History Narrative  . No narrative on file     ROS A 10 point review of system was performed. It is negative other than that mentioned in the history of present illness.   PHYSICAL EXAM   BP 140/96  Pulse 63  Ht 5\' 6"  (1.676 m)  Wt 188 lb 12 oz (85.616 kg)  BMI 30.48 kg/m2 Constitutional: She is oriented to person, place, and time. She appears well-developed and well-nourished. No distress.  HENT: No nasal discharge.  Head: Normocephalic and atraumatic.  Eyes: Pupils are equal and round. No discharge.  Neck: Normal range of motion. Neck supple. No JVD present. No thyromegaly present.  Cardiovascular: Normal rate, regular rhythm, normal  heart sounds. Exam reveals no gallop and no friction rub. No murmur heard.  Pulmonary/Chest: Effort normal and breath sounds normal. No stridor. No respiratory distress. She has no wheezes. She has no rales. She exhibits no tenderness.  Abdominal: Soft. Bowel sounds are normal. She exhibits no distension. There is no tenderness. There is no rebound and no guarding.  Musculoskeletal: Normal range of motion. She exhibits no edema and no tenderness.  Neurological: She is alert and oriented to person, place, and time. Coordination normal.  Skin: Skin is warm and dry. No rash noted. She is not diaphoretic. No erythema. No pallor.  Psychiatric: She has a normal mood and affect. Her  behavior is normal. Judgment and thought content normal.     BLT:[id number]  Rhythm  Low voltage in precordial leads.   ABNORMAL     ASSESSMENT AND PLAN

## 2014-06-18 NOTE — Assessment & Plan Note (Signed)
Blood pressure is reasonably controlled. 

## 2014-06-18 NOTE — Assessment & Plan Note (Signed)
The quality of her chest pain is suggestive of angina. However, this has been happening at rest and not with physical activities. Given her symptoms and risk factors for coronary artery disease, I requested a stress echocardiogram for evaluation. If cardiac workup is negative and she continues to have symptoms, consider further GI evaluation

## 2014-06-18 NOTE — Patient Instructions (Signed)
Exercise Stress Echocardiogram An exercise stress echocardiogram is a heart (cardiac) test used to check the function of your heart. This test may also be called an exercise stress echocardiography or stress echo. This stress test will check how well your heart muscle and valves are working and determine if your heart muscle is getting enough blood. You will exercise on a treadmill to naturally increase or stress the functioning of your heart.  An echocardiogram uses sound waves (ultrasound) to produce an image of your heart. If your heart does not work normally, it may indicate coronary artery disease with poor coronary blood supply. The coronary arteries are the arteries that bring blood and oxygen to your heart. LET [facility] CARE PROVIDER KNOW ABOUT:  Any allergies you have.  All medicines you are taking, including vitamins, herbs, eye drops, creams, and over-the-counter medicines.  Previous problems you or members of your family have had with the use of anesthetics.  Any blood disorders you have.  Previous surgeries you have had.  Medical conditions you have.  Possibility of pregnancy, if this applies. RISKS AND COMPLICATIONS Generally, this is a safe procedure. However, as with any procedure, complications can occur. Possible complications can include:  You develop pain or pressure in the following areas:  Chest.  Jaw or neck.  Between your shoulder blades.  Radiating down your left arm.  Dizziness or lightheadedness.  Shortness of breath.  Increased or irregular heartbeat.  Nausea or vomiting.  Heart attack (rare). BEFORE THE PROCEDURE  Avoid all forms of caffeine for 24 hours before your test or as directed by your health care provider. This includes coffee, tea (even decaffeinated tea), caffeinated sodas, chocolate, cocoa, and certain pain medicines.  Follow your health care provider's instructions regarding eating and drinking before the test.  Take your  medicines as directed at regular times with water unless instructed otherwise. Exceptions may include:  If you have diabetes, ask how you are to take your insulin or pills. It is common to adjust insulin dosing the morning of the test.  If you are taking beta-blocker medicines, it is important to talk to your health care provider about these medicines well before the date of your test. Taking beta-blocker medicines may interfere with the test. In some cases, these medicines need to be changed or stopped 24 hours or more before the test.  If you wear a nitroglycerin patch, it may need to be removed prior to the test. Ask your health care provider if the patch should be removed before the test.  If you use an inhaler for any breathing condition, bring it with you to the test.  If you are an outpatient, bring a snack so you can eat right after the stress phase of the test.  Do not smoke for 4 hours prior to the test or as directed by your health care provider.  Wear loose-fitting clothes and comfortable shoes for the test. This test involves walking on a treadmill. PROCEDURE   Multiple electrodes will be put on your chest. If needed, small areas of your chest may be shaved to get better contact with the electrodes. Once the electrodes are attached to your body, multiple wires will be attached to the electrodes, and your heart rate will be monitored.  You will have an echocardiogram done at rest.  To produce this image of your heart, gel is applied to your chest, and a wand-like tool (transducer) is moved over the chest. The transducer sends the sound waves  through the chest to create the moving images of your heart.  You may need an IV to receive a medication that improves the quality of the pictures.  You will then walk on a treadmill. The treadmill will be started at a slow pace. The treadmill speed and incline will gradually be increased to raise your heart rate.  At the peak of exercise,  the treadmill will be stopped. You will lie down immediately on a bed so that a second echocardiogram can be done to visualize your heart's motion with exercise.  The test usually takes 30-60 minutes to complete. AFTER THE PROCEDURE  Your heart rate and blood pressure will be monitored after the test.  You may return to your normal schedule, including diet, activities, and medicines, unless your health care provider tells you otherwise. Document Released: 12/14/2004 Document Revised: 12/15/2013 Document Reviewed: 08/17/2013 [facility] Patient Information 2015 [place], [place]. This information is not intended to replace advice given to you by your health care provider. Make sure you discuss any questions you have with your health care provider. . For further information please visit [email or web address]. Please follow instruction sheet as given.

## 2014-07-08 ENCOUNTER — Other Ambulatory Visit (INDEPENDENT_AMBULATORY_CARE_PROVIDER_SITE_OTHER): Payer: Private Health Insurance - Indemnity

## 2014-07-08 DIAGNOSIS — R079 Chest pain, unspecified: Secondary | ICD-10-CM

## 2014-07-08 DIAGNOSIS — R0789 Other chest pain: Secondary | ICD-10-CM

## 2014-07-08 DIAGNOSIS — R0602 Shortness of breath: Secondary | ICD-10-CM

## 2014-07-14 ENCOUNTER — Telehealth: Payer: Self-pay | Admitting: *Deleted

## 2014-07-14 DIAGNOSIS — Z01812 Encounter for preprocedural laboratory examination: Secondary | ICD-10-CM

## 2014-07-14 DIAGNOSIS — R079 Chest pain, unspecified: Secondary | ICD-10-CM

## 2014-07-14 DIAGNOSIS — R0600 Dyspnea, unspecified: Secondary | ICD-10-CM

## 2014-07-14 NOTE — Telephone Encounter (Signed)
Reviewed cath instructions and pre cath lab instructions with patient  Lab orders and instructions left at front desk for patient  Patient verbalized understanding and stated she will have her labs and chest x ray tomorrow

## 2014-07-14 NOTE — Telephone Encounter (Signed)
[facility] Cardiac Cath Instructions   You are scheduled for a Cardiac Cath on:___7/27/15______________________  Please arrive at __0830 am_____am on the day of your procedure  You will need to pre-register prior to the day of your procedure.  Enter through the [employer] at [facility].  Registration is the first desk on your right.  Please take the procedure order we have given you in order to be registered appropriately  Do not eat/drink anything after midnight  Someone will need to drive you home  It is recommended someone be with you for the first 24 hours after your procedure  Wear clothes that are easy to get on/off and wear slip on shoes if possible   Medications bring a current list of all medications with you  _x__ You may take all of your medications the morning of your procedure with enough water to swallow safely     Day of your procedure: Arrive at the [place] entrance.  Free valet service is available.  After entering the [place] please check-in at the registration desk (1st desk on your right) to receive your armband. After receiving your armband someone will escort you to the cardiac cath/special procedures waiting area.  The usual length of stay after your procedure is about 2 to 3 hours.  This can vary.  If you have any questions, please call our office at [phone], or you may call the cardiac cath lab at [facility] directly at [phone]  Your physician recommends that you return for lab work in:  Please take lab orders to [facility] for labs  BMP CBC  INR  Please take order to [facility] for chest x ray

## 2014-07-15 ENCOUNTER — Other Ambulatory Visit: Payer: Self-pay

## 2014-07-15 DIAGNOSIS — R0600 Dyspnea, unspecified: Secondary | ICD-10-CM

## 2014-07-15 DIAGNOSIS — R079 Chest pain, unspecified: Secondary | ICD-10-CM

## 2014-07-15 DIAGNOSIS — Z01812 Encounter for preprocedural laboratory examination: Secondary | ICD-10-CM

## 2014-07-15 LAB — BASIC METABOLIC PANEL
Anion Gap: 5 — ABNORMAL LOW (ref 7–16)
BUN: 19 mg/dL — ABNORMAL HIGH (ref 7–18)
Calcium, Total: 8.9 mg/dL (ref 8.5–10.1)
Chloride: 101 mmol/L (ref 98–107)
Co2: 32 mmol/L (ref 21–32)
Creatinine: 1.05 mg/dL (ref 0.60–1.30)
EGFR (African American): >60
EGFR (Non-African Amer.): 57 — ABNORMAL LOW
Glucose: 95 mg/dL (ref 65–99)
Osmolality: 278 (ref 275–301)
Potassium: 4 mmol/L (ref 3.5–5.1)
Sodium: 138 mmol/L (ref 136–145)

## 2014-07-15 LAB — CBC WITH DIFFERENTIAL/PLATELET
Basophil #: 0 10*3/uL (ref 0.0–0.1)
Basophil %: 0.6 %
Eosinophil #: 0 10*3/uL (ref 0.0–0.7)
Eosinophil %: 0.7 %
HCT: 38.3 % (ref 35.0–47.0)
HGB: 12.9 g/dL (ref 12.0–16.0)
Lymphocyte #: 2 10*3/uL (ref 1.0–3.6)
Lymphocyte %: 29.7 %
MCH: 31.5 pg (ref 26.0–34.0)
MCHC: 33.6 g/dL (ref 32.0–36.0)
MCV: 94 fL (ref 80–100)
Monocyte #: 0.5 x10 3/mm (ref 0.2–0.9)
Monocyte %: 7.4 %
Neutrophil #: 4.2 10*3/uL (ref 1.4–6.5)
Neutrophil %: 61.6 %
Platelet: 278 10*3/uL (ref 150–440)
RBC: 4.08 10*6/uL (ref 3.80–5.20)
RDW: 12.5 % (ref 11.5–14.5)
WBC: 6.8 10*3/uL (ref 3.6–11.0)

## 2014-07-15 LAB — PROTIME-INR
INR: 0.9
Prothrombin Time: 11.9 secs (ref 11.5–14.7)

## 2014-07-16 NOTE — Telephone Encounter (Signed)
Cath orders faxed to [facility] 7/24

## 2014-07-19 ENCOUNTER — Ambulatory Visit: Payer: Self-pay | Admitting: Cardiovascular Disease

## 2014-07-19 DIAGNOSIS — R079 Chest pain, unspecified: Secondary | ICD-10-CM

## 2014-07-19 HISTORY — PX: CARDIAC CATHETERIZATION: SHX172

## 2014-07-20 ENCOUNTER — Other Ambulatory Visit: Payer: Self-pay

## 2014-07-20 DIAGNOSIS — Z01812 Encounter for preprocedural laboratory examination: Secondary | ICD-10-CM

## 2014-07-20 DIAGNOSIS — R079 Chest pain, unspecified: Secondary | ICD-10-CM

## 2014-07-20 DIAGNOSIS — R0600 Dyspnea, unspecified: Secondary | ICD-10-CM

## 2014-08-02 ENCOUNTER — Encounter: Payer: Self-pay | Admitting: Cardiovascular Disease

## 2014-08-02 ENCOUNTER — Ambulatory Visit (INDEPENDENT_AMBULATORY_CARE_PROVIDER_SITE_OTHER): Payer: Private Health Insurance - Indemnity | Admitting: Cardiovascular Disease

## 2014-08-02 VITALS — BP 132/90 | HR 65 | Ht 66.0 in | Wt 187.5 lb

## 2014-08-02 DIAGNOSIS — R0789 Other chest pain: Secondary | ICD-10-CM

## 2014-08-02 DIAGNOSIS — R0602 Shortness of breath: Secondary | ICD-10-CM

## 2014-08-02 NOTE — Progress Notes (Signed)
Primary care physician: Dr. Diona Browner  HPI  This is a pleasant 63 year old female who is here today for a followup visit regarding  chest pain. She has no previous cardiac history. She has known history of hypertension, hyperlipidemia and gastroesophageal reflux disease. She has no diabetes and does not smoke. There is family history of coronary artery disease but not prematurely. She was seen recently for chest pain with some anginal and some atypical features. I proceeded with a stress echocardiogram which showed possible anterior wall ischemia. Cardiac catheterization was done via the right radial artery which showed minor irregularities without obstructive disease. Left ventricular end-diastolic pressure was normal. She reports no further chest pain. She continues to have exertional dyspnea.    No Known Allergies   Current Outpatient Prescriptions on File Prior to Visit  Medication Sig Dispense Refill  . Calcium-Vitamin D 600-200 MG-UNIT per tablet Take 1 tablet by mouth 2 (two) times daily.        Marland Kitchen estradiol (ESTRACE) 0.5 MG tablet Take 0.5 mg by mouth as needed.      Marland Kitchen omeprazole (PRILOSEC) 20 MG capsule Take 20 mg by mouth daily.      . valsartan-hydrochlorothiazide (DIOVAN-HCT) 80-12.5 MG per tablet Take 1 tablet by mouth daily.  30 tablet  11   No current facility-administered medications on file prior to visit.     Past Medical History  Diagnosis Date  . GERD (gastroesophageal reflux disease)   . Hypertension      Past Surgical History  Procedure Laterality Date  . Vaginal hysterectomy    . Colonoscopy    . Knee surgery      left  . Carpal tunnel release      both  . Cardiac catheterization  07/19/2014    [facility]     Family History  Problem Relation Age of Onset  . Colon cancer Mother   . Hyperlipidemia Mother   . Hypertension Mother   . Esophageal cancer Neg Hx   . Stomach cancer Neg Hx   . Rectal cancer Neg Hx   . AAA (abdominal aortic aneurysm) Father   .  Hyperlipidemia Father   . Hypertension Father   . Heart disease Father      History   Social History  . Marital Status: Married    Spouse Name: N/A    Number of Children: N/A  . Years of Education: N/A   Occupational History  . Not on file.   Social History Main Topics  . Smoking status: Never Smoker   . Smokeless tobacco: Never Used  . Alcohol Use: No     Comment: occassioinal  . Drug Use: No  . Sexual Activity: Not on file   Other Topics Concern  . Not on file   Social History Narrative  . No narrative on file     ROS A 10 point review of system was performed. It is negative other than that mentioned in the history of present illness.   PHYSICAL EXAM   BP 132/90  Pulse 65  Ht 5\' 6"  (1.676 m)  Wt 187 lb 8 oz (85.049 kg)  BMI 30.28 kg/m2 Constitutional: She is oriented to person, place, and time. She appears well-developed and well-nourished. No distress.  HENT: No nasal discharge.  Head: Normocephalic and atraumatic.  Eyes: Pupils are equal and round. No discharge.  Neck: Normal range of motion. Neck supple. No JVD present. No thyromegaly present.  Cardiovascular: Normal rate, regular rhythm, normal heart sounds. Exam reveals no  gallop and no friction rub. No murmur heard.  Pulmonary/Chest: Effort normal and breath sounds normal. No stridor. No respiratory distress. She has no wheezes. She has no rales. She exhibits no tenderness.  Abdominal: Soft. Bowel sounds are normal. She exhibits no distension. There is no tenderness. There is no rebound and no guarding.  Musculoskeletal: Normal range of motion. She exhibits no edema and no tenderness.  Neurological: She is alert and oriented to person, place, and time. Coordination normal.  Skin: Skin is warm and dry. No rash noted. She is not diaphoretic. No erythema. No pallor.  Psychiatric: She has a normal mood and affect. Her behavior is normal. Judgment and thought content normal.  Right radial pulse is normal no  hematoma     ASSESSMENT AND PLAN

## 2014-08-02 NOTE — Patient Instructions (Signed)
Follow up as needed

## 2014-08-02 NOTE — Assessment & Plan Note (Signed)
False-positive stress echocardiogram. Cardiac catheterization showed no significant coronary artery disease. She is no longer having chest pain. She continues to have shortness of breath which does not seem to be cardiac in origin especially that her ejection fraction and left ventricular end-diastolic pressure were both normal.   She can followup with me as needed.

## 2014-10-11 ENCOUNTER — Encounter: Payer: Self-pay | Admitting: Cardiovascular Disease

## 2014-11-02 ENCOUNTER — Encounter: Payer: Self-pay | Admitting: Cardiovascular Disease

## 2015-03-10 DIAGNOSIS — J302 Other seasonal allergic rhinitis: Secondary | ICD-10-CM | POA: Insufficient documentation

## 2015-03-10 HISTORY — DX: Other seasonal allergic rhinitis: J30.2

## 2015-05-17 ENCOUNTER — Other Ambulatory Visit: Payer: Self-pay | Admitting: Family Medicine

## 2015-06-16 ENCOUNTER — Other Ambulatory Visit (INDEPENDENT_AMBULATORY_CARE_PROVIDER_SITE_OTHER): Payer: Managed Care, Other (non HMO)

## 2015-06-16 ENCOUNTER — Encounter: Payer: Self-pay | Admitting: Physician Assistant

## 2015-06-16 ENCOUNTER — Ambulatory Visit (INDEPENDENT_AMBULATORY_CARE_PROVIDER_SITE_OTHER): Payer: Managed Care, Other (non HMO) | Admitting: Physician Assistant

## 2015-06-16 VITALS — BP 138/88 | HR 60 | Ht 63.75 in | Wt 186.0 lb

## 2015-06-16 DIAGNOSIS — K219 Gastro-esophageal reflux disease without esophagitis: Secondary | ICD-10-CM

## 2015-06-16 DIAGNOSIS — R1013 Epigastric pain: Secondary | ICD-10-CM

## 2015-06-16 LAB — COMPREHENSIVE METABOLIC PANEL
ALT: 15 U/L (ref 0–35)
AST: 15 U/L (ref 0–37)
Albumin: 4.6 g/dL (ref 3.5–5.2)
Alkaline Phosphatase: 90 U/L (ref 39–117)
BUN: 18 mg/dL (ref 6–23)
CO2: 29 mEq/L (ref 19–32)
Calcium: 9.8 mg/dL (ref 8.4–10.5)
Chloride: 103 mEq/L (ref 96–112)
Creatinine, Ser: 0.9 mg/dL (ref 0.40–1.20)
GFR: 67.03 mL/min (ref >60.00)
Glucose, Bld: 95 mg/dL (ref 70–99)
Potassium: 4.7 mEq/L (ref 3.5–5.1)
Sodium: 138 mEq/L (ref 135–145)
Total Bilirubin: 0.5 mg/dL (ref 0.2–1.2)
Total Protein: 7.7 g/dL (ref 6.0–8.3)

## 2015-06-16 LAB — CBC WITH DIFFERENTIAL/PLATELET
Basophils Absolute: 0.1 10*3/uL (ref 0.0–0.1)
Basophils Relative: 0.7 % (ref 0.0–3.0)
Eosinophils Absolute: 0.1 10*3/uL (ref 0.0–0.7)
Eosinophils Relative: 0.6 % (ref 0.0–5.0)
HCT: 41.5 % (ref 36.0–46.0)
Hemoglobin: 14.1 g/dL (ref 12.0–15.0)
Lymphocytes Relative: 30.6 % (ref 12.0–46.0)
Lymphs Abs: 2.6 10*3/uL (ref 0.7–4.0)
MCHC: 33.9 g/dL (ref 30.0–36.0)
MCV: 92.2 fl (ref 78.0–100.0)
Monocytes Absolute: 0.7 10*3/uL (ref 0.1–1.0)
Monocytes Relative: 7.8 % (ref 3.0–12.0)
Neutro Abs: 5.2 10*3/uL (ref 1.4–7.7)
Neutrophils Relative %: 60.3 % (ref 43.0–77.0)
Platelets: 309 10*3/uL (ref 150.0–400.0)
RBC: 4.5 Mil/uL (ref 3.87–5.11)
RDW: 12.8 % (ref 11.5–15.5)
WBC: 8.6 10*3/uL (ref 4.0–10.5)

## 2015-06-16 LAB — LIPASE: Lipase: 21 U/L (ref 11.0–59.0)

## 2015-06-16 LAB — AMYLASE: Amylase: 46 U/L (ref 27–131)

## 2015-06-16 MED ORDER — PANTOPRAZOLE SODIUM 40 MG PO TBEC: 40.0000 mg | DELAYED_RELEASE_TABLET | Freq: Every day | ORAL | Status: DC

## 2015-06-16 NOTE — Patient Instructions (Addendum)
Please go to the basement level to have your labs drawn.  You have been scheduled for an endoscopy. Please follow written instructions given to you at your visit today. If you use inhalers (even only as needed), please bring them with you on the day of your procedure. Your physician has requested that you go to www.startemmi.com and enter the access code given to you at your visit today. This web site gives a general overview about your procedure. However, you should still follow specific instructions given to you by our office regarding your preparation for the procedure.  You have been scheduled for an abdominal ultrasound at  [place], [place], [address] (1st floor of hospital) on 06-21-2015 at 8:45 am. Please arrive at 8:30 amprior to your appointment for registration. Make certain not to have anything to eat or drink 6 hours prior to your appointment. Should you need to reschedule your appointment, please contact radiology at [phone]. This test typically takes about 30 minutes to perform.  DIscontinue the Omeprazole.  We sent a prescription to Pantoprazole  Sodium 40 mg to [facility].

## 2015-06-16 NOTE — Progress Notes (Signed)
Patient ID: [name], female   DOB: 13-Aug-1951, 64 y.o.   MRN: [id number]     History of Present Illness: Claire Thomas  With a history of migraines, hypertension, allergic rhinitis, GERD, IBS, peptic ulcer disease, osteopenia, peripheral edema, hypercholesterolemia, tinnitus, and colon polyps. She is known to Dr. Carlean Purl and last had a colonoscopy in October 2013 at which time a diminutive sessile polyp was found in the sigmoid colon , mild diverticulosis was noted in the sigmoid, small internal hemorrhoids were noted as well. 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 She was advised to have a surveillance colonoscopy in 5 years. She last had an upper endoscopy on 05/12/2009 at which time she was found to have mild gastritis in the body and antrum of the stomach , tortuous total esophagus suggestive of dysmotility, no stricture.    She is here today with complaints of epigastric pain of 4-5 months duration. About a year ago, she had pain across the upper abdomen into the left flank and was sent for a renal ultrasound which was nonrevealing and she was told she pulled a muscle. Over the past 4-5 months she has epigastric pain that frequently radiates to both left upper quadrant and right upper quadrant pain. She occasionally wakes up with left upper quadrant pain. Her epigastric pain is described as a burning and pressure and is worse on an empty stomach and temporary alleviated with ingestion of food. She states she constantly feels hungry but she. After 2 bites and gets very bloated. Many times after eating she gets a tightness across the upper abdomen. She has no nausea or vomiting. She denies use of non-steroidal anti-inflammatory. She had been on omeprazole for a few months but says it doesn't work. She has had no bright red blood per rectum or melena.   Past Medical History  Diagnosis Date  . GERD (gastroesophageal reflux disease)    . Hypertension   . IBS (irritable bowel syndrome)     Past Surgical History  Procedure Laterality Date  . Partial hysterectomy    . Colonoscopy    . Meniscus repair Left   . Carpal tunnel release Bilateral   . Cardiac catheterization  07/19/2014    [facility]   Family History  Problem Relation Age of Onset  . Colon cancer Mother   . Hyperlipidemia Mother   . Hypertension Mother   . Esophageal cancer Neg Hx   . Stomach cancer Neg Hx   . Rectal cancer Neg Hx   . AAA (abdominal aortic aneurysm) Father   . Hyperlipidemia Father   . Hypertension Father   . Prostate cancer Father   . Pulmonary fibrosis Mother   . Diabetes Paternal Grandmother    History  Substance Use Topics  . Smoking status: Never Smoker   . Smokeless tobacco: Never Used  . Alcohol Use: 0.0 oz/week    0 Standard drinks or equivalent per week     Comment: occassioinal-wine every 2 months   Current Outpatient Prescriptions  Medication Sig Dispense Refill  . Calcium-Vitamin D 600-200 MG-UNIT per tablet Take 1 tablet by mouth 2 (two) times daily.      Marland Kitchen estradiol (ESTRACE) 0.5 MG tablet Take 0.5 mg by mouth as needed.    Marland Kitchen omeprazole (PRILOSEC) 20 MG capsule Take 20 mg by mouth as needed.     . pantoprazole (PROTONIX) 40 MG tablet Take 1 tablet (40 mg total) by mouth daily. 30 tablet  6   No current facility-administered medications for this visit.   No Known Allergies    Review of Systems: Gen: Denies any fever, chills, sweats, anorexia, fatigue, weakness, malaise, weight loss, and sleep disorder CV: Denies chest pain, angina, palpitations, syncope, orthopnea, PND, peripheral edema, and claudication. Resp: Denies dyspnea at rest, dyspnea with exercise, cough, sputum, wheezing, coughing up blood, and pleurisy. GI: Denies vomiting blood, jaundice, and fecal incontinence.   Denies dysphagia or odynophagia. GU : Denies urinary burning, blood in urine, urinary frequency, urinary hesitancy, nocturnal urination,  and urinary incontinence. MS: Denies joint pain, limitation of movement, and swelling, stiffness, low back pain, extremity pain. Denies muscle weakness, cramps, atrophy.  Derm: Denies rash, itching, dry skin, hives, moles, warts, or unhealing ulcers.  Psych: Denies depression, anxiety, memory loss, suicidal ideation, hallucinations, paranoia, and confusion. Heme: Denies bruising, bleeding, and enlarged lymph nodes. Neuro:  Denies any headaches, dizziness, paresthesia Endo:  Denies any problems with DM, thyroid, adrenal    Physical Exam: General: Pleasant, well developed ,  whitefemale in no acute distress Head: Normocephalic and atraumatic Eyes:  sclerae anicteric, conjunctiva pink  Ears: Normal auditory acuity Lungs: Clear throughout to auscultation Heart: Regular rate and rhythm Abdomen: Soft, non distended,  Mild epigastric and right upper quadrant tenderness to palpation with no rebound or guardingNo masses, no hepatomegaly. Normal bowel sounds Musculoskeletal: Symmetrical with no gross deformities  Extremities: No edema  Neurological: Alert oriented x 4, grossly nonfocal Psychological:  Alert and cooperative. Normal mood and affect  Assessment and Recommendations:  64 year old female with a history of GERD presenting with a 4 to five-month history of epigastric pain. Symptoms may be due to a gastritis or ulcer. An antireflux regimen has been reviewed and she will be given a trial of pantoprazole 40 mg 1 by mouth every morning 30 minutes prior to breakfast. She will be scheduled for an EGD to evaluate for esophagitis, gastritis, ulcer, duodenitis , etc.The risks, benefits, and alternatives to endoscopy with possible biopsy and possible dilation were discussed with the patient and they consent to proceed.  The procedure will be scheduled with Dr. Carlean Purl. A CBC, comprehensive metabolic panel, amylase, and lipase will be obtained as well as an abdominal ultrasound to evaluate for possible  biliary disease. Further recommendations will be made pending the findings of the above.        Claire Thomas, Deloris Ping 06/16/2015,

## 2015-06-21 ENCOUNTER — Ambulatory Visit
Admission: RE | Admit: 2015-06-21 | Discharge: 2015-06-21 | Disposition: A | Discharge status: Home / Self Care | Payer: Managed Care, Other (non HMO) | Source: Ambulatory Visit | Attending: Physician Assistant | Admitting: Physician Assistant

## 2015-06-21 DIAGNOSIS — K219 Gastro-esophageal reflux disease without esophagitis: Secondary | ICD-10-CM | POA: Diagnosis not present

## 2015-06-21 DIAGNOSIS — R1013 Epigastric pain: Secondary | ICD-10-CM | POA: Diagnosis not present

## 2015-06-22 NOTE — Progress Notes (Signed)
Agree w/ Ms. Hvozdovic's note and mangement.  

## 2015-07-06 ENCOUNTER — Encounter: Payer: Managed Care, Other (non HMO) | Admitting: Internal Medicine

## 2015-07-19 ENCOUNTER — Encounter: Payer: Self-pay | Admitting: Internal Medicine

## 2015-07-19 ENCOUNTER — Ambulatory Visit (AMBULATORY_SURGERY_CENTER): Payer: Managed Care, Other (non HMO) | Admitting: Internal Medicine

## 2015-07-19 VITALS — BP 119/57 | HR 63 | Temp 96.8°F | Resp 16 | Ht 63.0 in | Wt 186.0 lb

## 2015-07-19 DIAGNOSIS — K209 Esophagitis, unspecified without bleeding: Secondary | ICD-10-CM

## 2015-07-19 DIAGNOSIS — K2100 Gastro-esophageal reflux disease with esophagitis, without bleeding: Secondary | ICD-10-CM

## 2015-07-19 DIAGNOSIS — R1013 Epigastric pain: Secondary | ICD-10-CM | POA: Diagnosis not present

## 2015-07-19 MED ORDER — SODIUM CHLORIDE 0.9 % IV SOLN: 500.0000 mL | INTRAVENOUS | Status: DC

## 2015-07-19 MED ORDER — DICYCLOMINE HCL 20 MG PO TABS: ORAL_TABLET | ORAL | Status: DC

## 2015-07-19 NOTE — Progress Notes (Signed)
Transferred to recovery room. A/O x3, pleased with MAC.  VSS.  Report to April, RN. 

## 2015-07-19 NOTE — Op Note (Signed)
[facility]  [address] [place], [address]   ENDOSCOPY PROCEDURE REPORT  PATIENT: Claire Thomas, Claire Thomas  MR#: [id number] BIRTHDATE: [birth year] , 28  yrs. old GENDER: female ENDOSCOPIST: Gatha Mayer, MD, [facility] PROCEDURE DATE:  07/19/2015 PROCEDURE:  EGD w/ biopsy ASA CLASS:     Class II INDICATIONS:  epigastric pain. MEDICATIONS: Propofol 140 mg IV and Monitored anesthesia care TOPICAL ANESTHETIC: none  DESCRIPTION OF PROCEDURE: After the risks benefits and alternatives of the procedure were thoroughly explained, informed consent was obtained.  The LB [id number] endoscope was introduced through the mouth and advanced to the second portion of the duodenum , Without limitations.  The instrument was slowly withdrawn as the mucosa was fully examined.    1) small superficial erosion distal esophagus - biopsies taken 2) Small hiatal hernia 3) Otherwise normal.  Retroflexed views revealed as previously described.     The scope was then withdrawn from the patient and the procedure completed.  COMPLICATIONS: There were no immediate complications.  ENDOSCOPIC IMPRESSION: 1) small superficial erosion distal esophagus 2) Small hiatal hernia 3) Otherwise normal  RECOMMENDATIONS: 1.  Office will call with results 2.  Add dicyclomine 10-20 mg qac   eSigned:  Gatha Mayer, MD, [facility] 07/19/2015 3:52 PM    CC: Dr. Warrick Parisian and The Patient

## 2015-07-19 NOTE — Progress Notes (Signed)
Called to room to assist during endoscopic procedure.  Patient ID and intended procedure confirmed with present staff. Received instructions for my participation in the procedure from the performing physician.Called to room to assist during endoscopic procedure.  Patient ID and intended procedure confirmed with present staff. Received instructions for my participation in the procedure from the performing physician. 

## 2015-07-19 NOTE — Patient Instructions (Addendum)
There is a small area of suspected inflammation at the end of the esophagus that I biopsied. There is a small hiatal hernia. All else ok.  Please add dicyclomine 20 mg tabs 1/2 to 1 before meals as needed to see if that helps (it relaxes the stomach and intestine muscles). I sent a prescription.  I appreciate the opportunity to care for you. Gatha Mayer, MD, FACG  YOU HAD AN ENDOSCOPIC PROCEDURE TODAY AT [facility]:   Refer to the procedure report that was given to you for any specific questions about what was found during the examination.  If the procedure report does not answer your questions, please call your gastroenterologist to clarify.  If you requested that your care partner not be given the details of your procedure findings, then the procedure report has been included in a sealed envelope for you to review at your convenience later.  YOU SHOULD EXPECT: Some feelings of bloating in the abdomen. Passage of more gas than usual.  Walking can help get rid of the air that was put into your GI tract during the procedure and reduce the bloating. If you had a lower endoscopy (such as a colonoscopy or flexible sigmoidoscopy) you may notice spotting of blood in your stool or on the toilet paper. If you underwent a bowel prep for your procedure, you may not have a normal bowel movement for a few days.  Please Note:  You might notice some irritation and congestion in your nose or some drainage.  This is from the oxygen used during your procedure.  There is no need for concern and it should clear up in a day or so.  SYMPTOMS TO REPORT IMMEDIATELY:   Following upper endoscopy (EGD)  Vomiting of blood or coffee ground material  New chest pain or pain under the shoulder blades  Painful or persistently difficult swallowing  New shortness of breath  Fever of 100F or higher  Black, tarry-looking stools  For urgent or emergent issues, a gastroenterologist can be reached  at any hour by calling [phone].   DIET: Your first meal following the procedure should be a small meal and then it is ok to progress to your normal diet. Heavy or fried foods are harder to digest and may make you feel nauseous or bloated.  Likewise, meals heavy in dairy and vegetables can increase bloating.  Drink plenty of fluids but you should avoid alcoholic beverages for 24 hours.  ACTIVITY:  You should plan to take it easy for the rest of today and you should NOT DRIVE or use heavy machinery until tomorrow (because of the sedation medicines used during the test).    FOLLOW UP: Our staff will call the number listed on your records the next business day following your procedure to check on you and address any questions or concerns that you may have regarding the information given to you following your procedure. If we do not reach you, we will leave a message.  However, if you are feeling well and you are not experiencing any problems, there is no need to return our call.  We will assume that you have returned to your regular daily activities without incident.  If any biopsies were taken you will be contacted by phone or by letter within the next 1-3 weeks.  Please call us at [phone] if you have not heard about the biopsies in 3 weeks.    SIGNATURES/CONFIDENTIALITY: You and/or your care partner  have signed paperwork which will be entered into your electronic medical record.  These signatures attest to the fact that that the information above on your After Visit Summary has been reviewed and is understood.  Full responsibility of the confidentiality of this discharge information lies with you and/or your care-partner.  Hiatal hernia-handout given  Dicyclomine 10-20 mg every morning

## 2015-07-20 ENCOUNTER — Telehealth: Payer: Self-pay | Admitting: *Deleted

## 2015-07-20 NOTE — Telephone Encounter (Signed)
Name identifier, left message, follow-up 

## 2015-07-25 ENCOUNTER — Encounter: Payer: Self-pay | Admitting: Internal Medicine

## 2015-07-27 NOTE — Progress Notes (Signed)
Quick Note:  Mild inflammation on bxs - consistent with reflux damage Stay on pantoprazole every day  Please call from office and ask her how she is   [place] no letter or recall ______

## 2015-07-28 ENCOUNTER — Other Ambulatory Visit: Payer: Self-pay

## 2015-07-28 DIAGNOSIS — R1010 Upper abdominal pain, unspecified: Secondary | ICD-10-CM

## 2015-07-28 NOTE — Progress Notes (Signed)
Quick Note:  Please order CT abd with IV contrast to evaluate upper abdominal pain despite PPI Do not think she needs a pelvic CT given location of pain ______

## 2015-07-29 ENCOUNTER — Encounter: Payer: Self-pay | Admitting: Internal Medicine

## 2015-07-29 ENCOUNTER — Other Ambulatory Visit: Payer: Managed Care, Other (non HMO)

## 2015-08-03 ENCOUNTER — Ambulatory Visit
Admission: RE | Admit: 2015-08-03 | Discharge: 2015-08-03 | Disposition: A | Discharge status: Home / Self Care | Payer: Managed Care, Other (non HMO) | Source: Ambulatory Visit | Attending: Internal Medicine | Admitting: Internal Medicine

## 2015-08-03 DIAGNOSIS — K449 Diaphragmatic hernia without obstruction or gangrene: Secondary | ICD-10-CM | POA: Insufficient documentation

## 2015-08-03 DIAGNOSIS — R1010 Upper abdominal pain, unspecified: Secondary | ICD-10-CM | POA: Diagnosis present

## 2015-08-03 DIAGNOSIS — R918 Other nonspecific abnormal finding of lung field: Secondary | ICD-10-CM | POA: Insufficient documentation

## 2015-08-03 MED ORDER — IOHEXOL 300 MG/ML  SOLN
100.0000 mL | Freq: Once | INTRAMUSCULAR | Status: AC | PRN
  Administered 2015-08-03: 80 mL via INTRAVENOUS

## 2015-08-04 ENCOUNTER — Encounter: Payer: Self-pay | Admitting: Internal Medicine

## 2015-08-04 NOTE — Progress Notes (Signed)
Quick Note:  Tiny lung nodules and a hiatal hernia Will My Chart message results and f/u Place recall for chest CT 1 year ______

## 2015-08-24 ENCOUNTER — Encounter: Payer: Self-pay | Admitting: Internal Medicine

## 2016-01-26 LAB — HM HEPATITIS C SCREENING LAB: HM Hepatitis Screen: NEGATIVE

## 2016-06-13 ENCOUNTER — Other Ambulatory Visit: Payer: Self-pay | Admitting: Internal Medicine

## 2016-07-26 DIAGNOSIS — Z1231 Encounter for screening mammogram for malignant neoplasm of breast: Secondary | ICD-10-CM | POA: Diagnosis not present

## 2016-08-01 DIAGNOSIS — Z136 Encounter for screening for cardiovascular disorders: Secondary | ICD-10-CM | POA: Diagnosis not present

## 2016-08-01 DIAGNOSIS — R5383 Other fatigue: Secondary | ICD-10-CM | POA: Diagnosis not present

## 2016-08-01 DIAGNOSIS — J302 Other seasonal allergic rhinitis: Secondary | ICD-10-CM | POA: Diagnosis not present

## 2016-08-01 DIAGNOSIS — K219 Gastro-esophageal reflux disease without esophagitis: Secondary | ICD-10-CM | POA: Diagnosis not present

## 2016-08-01 DIAGNOSIS — R739 Hyperglycemia, unspecified: Secondary | ICD-10-CM | POA: Diagnosis not present

## 2016-08-06 ENCOUNTER — Telehealth: Payer: Self-pay

## 2016-08-06 DIAGNOSIS — R911 Solitary pulmonary nodule: Secondary | ICD-10-CM

## 2016-08-06 NOTE — Telephone Encounter (Signed)
-----   Message from Ricci Barker, RN sent at 08/04/2015 10:15 AM EDT ----- Need CT scan in 1 year See results from 08/04/15 [facility]

## 2016-08-06 NOTE — Telephone Encounter (Signed)
Patient needs CT chest to follow up lung lesion from last August Left message for patient to call back

## 2016-08-06 NOTE — Telephone Encounter (Signed)
Patient notified that she is scheduled for [facility] on 08/14/16 11:30.

## 2016-08-14 ENCOUNTER — Ambulatory Visit
Admission: RE | Admit: 2016-08-14 | Discharge: 2016-08-14 | Disposition: A | Discharge status: Home / Self Care | Payer: Medicare Other | Source: Ambulatory Visit | Attending: Internal Medicine | Admitting: Internal Medicine

## 2016-08-14 DIAGNOSIS — J984 Other disorders of lung: Secondary | ICD-10-CM | POA: Insufficient documentation

## 2016-08-14 DIAGNOSIS — R911 Solitary pulmonary nodule: Secondary | ICD-10-CM

## 2016-08-14 LAB — POCT I-STAT CREATININE: Creatinine, Ser: 0.8 mg/dL (ref 0.44–1.00)

## 2016-08-14 MED ORDER — IOPAMIDOL (ISOVUE-300) INJECTION 61%
75.0000 mL | Freq: Once | INTRAVENOUS | Status: AC | PRN
  Administered 2016-08-14: 75 mL via INTRAVENOUS

## 2016-08-15 ENCOUNTER — Other Ambulatory Visit: Payer: Self-pay

## 2016-08-15 ENCOUNTER — Encounter: Payer: Self-pay | Admitting: Internal Medicine

## 2016-08-15 DIAGNOSIS — R911 Solitary pulmonary nodule: Secondary | ICD-10-CM

## 2016-08-15 DIAGNOSIS — R918 Other nonspecific abnormal finding of lung field: Secondary | ICD-10-CM | POA: Insufficient documentation

## 2016-08-15 NOTE — Progress Notes (Signed)
There may be slight growth of tiny lung lesion and possibly new 1 mm lesion. Tiny is key word here. I recommend referral to pulmonary at this point to get their input please.

## 2016-08-17 ENCOUNTER — Ambulatory Visit (INDEPENDENT_AMBULATORY_CARE_PROVIDER_SITE_OTHER): Payer: Medicare Other | Admitting: Internal Medicine

## 2016-08-17 ENCOUNTER — Encounter: Payer: Self-pay | Admitting: Internal Medicine

## 2016-08-17 VITALS — BP 136/78 | HR 103 | Ht 65.0 in | Wt 188.0 lb

## 2016-08-17 DIAGNOSIS — R911 Solitary pulmonary nodule: Secondary | ICD-10-CM

## 2016-08-17 DIAGNOSIS — R0609 Other forms of dyspnea: Secondary | ICD-10-CM

## 2016-08-17 MED ORDER — UMECLIDINIUM-VILANTEROL 62.5-25 MCG/INH IN AEPB: 1.0000 | INHALATION_SPRAY | Freq: Every day | RESPIRATORY_TRACT | 5 refills | Status: DC

## 2016-08-17 MED ORDER — ALBUTEROL SULFATE HFA 108 (90 BASE) MCG/ACT IN AERS: 2.0000 | INHALATION_SPRAY | RESPIRATORY_TRACT | 6 refills | Status: DC | PRN

## 2016-08-17 MED ORDER — UMECLIDINIUM-VILANTEROL 62.5-25 MCG/INH IN AEPB: 1.0000 | INHALATION_SPRAY | Freq: Every day | RESPIRATORY_TRACT | 0 refills | Status: DC

## 2016-08-17 NOTE — Progress Notes (Signed)
Patient ID: Claire Thomas, female   DOB: June 16, 1951, 65 y.o.   MRN: [id number] Patient seen in the office today and instructed on use of anoro ellipta.  Patient expressed understanding and demonstrated technique.anoro

## 2016-08-17 NOTE — Patient Instructions (Signed)
1.start Anoro 2.albuterol as needed 3.follow up in 3 months with PFT's and 6MWT 4.follow up in 6 months with repeat CT chest   Bronchospasm, Adult A bronchospasm is a spasm or tightening of the airways going into the lungs. During a bronchospasm breathing becomes more difficult because the airways get smaller. When this happens there can be coughing, a whistling sound when breathing (wheezing), and difficulty breathing. Bronchospasm is often associated with asthma, but not all patients who experience a bronchospasm have asthma. CAUSES  A bronchospasm is caused by inflammation or irritation of the airways. The inflammation or irritation may be triggered by:   Allergies (such as to animals, pollen, food, or mold). Allergens that cause bronchospasm may cause wheezing immediately after exposure or many hours later.   Infection. Viral infections are believed to be the most common cause of bronchospasm.   Exercise.   Irritants (such as pollution, cigarette smoke, strong odors, aerosol sprays, and paint fumes).   Weather changes. Winds increase molds and pollens in the air. Rain refreshes the air by washing irritants out. Cold air may cause inflammation.   Stress and emotional upset.  SIGNS AND SYMPTOMS   Wheezing.   Excessive nighttime coughing.   Frequent or severe coughing with a simple cold.   Chest tightness.   Shortness of breath.  DIAGNOSIS  Bronchospasm is usually diagnosed through a history and physical exam. Tests, such as chest X-rays, are sometimes done to look for other conditions. TREATMENT   Inhaled medicines can be given to open up your airways and help you breathe. The medicines can be given using either an inhaler or a nebulizer machine.  Corticosteroid medicines may be given for severe bronchospasm, usually when it is associated with asthma. HOME CARE INSTRUCTIONS   Always have a plan prepared for seeking medical care. Know when to call your health  care provider and local emergency services (911 in the [place] Know where you can access local emergency care.  Only take medicines as directed by your health care provider.  If you were prescribed an inhaler or nebulizer machine, ask your health care provider to explain how to use it correctly. Always use a spacer with your inhaler if you were given one.  It is necessary to remain calm during an attack. Try to relax and breathe more slowly.  Control your home environment in the following ways:   Change your heating and air conditioning filter at least once a month.   Limit your use of fireplaces and wood stoves.  Do not smoke and do not allow smoking in your home.   Avoid exposure to perfumes and fragrances.   Get rid of pests (such as roaches and mice) and their droppings.   Throw away plants if you see mold on them.   Keep your house clean and dust free.   Replace carpet with wood, tile, or vinyl flooring. Carpet can trap dander and dust.   Use allergy-proof pillows, mattress covers, and box spring covers.   Wash bed sheets and blankets every week in hot water and dry them in a dryer.   Use blankets that are made of polyester or cotton.   Wash hands frequently. SEEK MEDICAL CARE IF:   You have muscle aches.   You have chest pain.   The sputum changes from clear or white to yellow, green, gray, or bloody.   The sputum you cough up gets thicker.   There are problems that may be related to the medicine you  are given, such as a rash, itching, swelling, or trouble breathing.  SEEK IMMEDIATE MEDICAL CARE IF:   You have worsening wheezing and coughing even after taking your prescribed medicines.   You have increased difficulty breathing.   You develop severe chest pain. MAKE SURE YOU:   Understand these instructions.  Will watch your condition.  Will get help right away if you are not doing well or get worse.   This information is not intended  to replace advice given to you by your health care provider. Make sure you discuss any questions you have with your health care provider.   Document Released: 12/13/2003 Document Revised: 12/31/2014 Document Reviewed: 06/01/2013 Elsevier Interactive Patient Education [employer].

## 2016-08-17 NOTE — Progress Notes (Signed)
[facility] Consultation      Date: 08/17/2016,   MRN# [id number] Claire Thomas [birth year] Code Status:  Code Status History    This patient does not have a recorded code status. Please follow your organizational policy for patients in this situation.     Hosp day:@LENGTHOFSTAYDAYS @ Referring MD: @ATDPROV @     PCP:      AdmissionWeight: 188 lb (85.3 kg)                 CurrentWeight: 188 lb (85.3 kg) Claire Thomas is a 65 y.o. old female seen in consultation for abnormal CT chest at the request of Dr. Carlean Purl.     CHIEF COMPLAINT:   Cough and SOB   HISTORY OF PRESENT ILLNESS   65 yo pleasant white female seen today for several reasons  First reason is that patient has banomral CT chest-RLL nodule seen on CT chest 07/2016 approx 6MM  previous size was about 5MM I have reviewed old CT abd/pelvis and the RLL has been present since 2009  There [place] a vague RUL opacity as well, along with 6 mm LLL which will need to be followed  CT chest 07/2016 images reveiwed 08/17/2016   Second reason is that she has had chronic SOB with exertion for many years Associated with intermittent wheezing with mild productive cough with white phlegm Patient mostly has SOB while carrying stuff She has no acute SOB at this time No signs of infection at this time She is a nonsmoker, she has been exposed to extensive second hand smoke for 30 years   PAST MEDICAL HISTORY   Past Medical History:  Diagnosis Date  . GERD (gastroesophageal reflux disease)   . IBS (irritable bowel syndrome)      SURGICAL HISTORY   Past Surgical History:  Procedure Laterality Date  . CARDIAC CATHETERIZATION  07/19/2014   [facility]  . CARPAL TUNNEL RELEASE Bilateral   . COLONOSCOPY    . ESOPHAGOGASTRODUODENOSCOPY    . MENISCUS REPAIR Left   . PARTIAL HYSTERECTOMY       FAMILY HISTORY   Family History  Problem Relation Age of Onset  . Colon cancer Mother   . Hyperlipidemia Mother   .  Hypertension Mother   . Pulmonary fibrosis Mother   . AAA (abdominal aortic aneurysm) Father   . Hyperlipidemia Father   . Hypertension Father   . Prostate cancer Father   . Diabetes Paternal Grandmother   . Esophageal cancer Neg Hx   . Stomach cancer Neg Hx   . Rectal cancer Neg Hx      SOCIAL HISTORY   Social History  Substance Use Topics  . Smoking status: Never Smoker  . Smokeless tobacco: Never Used  . Alcohol use 0.0 oz/week     Comment: occassioinal-wine every 6 months     MEDICATIONS    Home Medication:  Current Outpatient Rx  . Order #: [id number] Class: Historical Med  . Order #: [id number] Class: Normal  . Order #: [id number] Class: Historical Med  . Order #: [id number] Class: Historical Med  . Order #: [id number] Class: Normal    Current Medication:  Current Outpatient Prescriptions:  .  Calcium-Vitamin D 600-200 MG-UNIT per tablet, Take 1 tablet by mouth 2 (two) times daily.  , Disp: , Rfl:  .  dicyclomine (BENTYL) 20 MG tablet, TAKE 1/2 TO 1 TABLET BY MOUTH 30 MINUTES BEFORE MEALS AS NEEDED, Disp: 120 tablet, Rfl: 0 .  estradiol (ESTRACE) 0.5 MG tablet, Take 0.5 mg  by mouth as needed., Disp: , Rfl:  .  omeprazole (PRILOSEC) 20 MG capsule, Take 20 mg by mouth as needed. , Disp: , Rfl:  .  pantoprazole (PROTONIX) 40 MG tablet, Take 1 tablet (40 mg total) by mouth daily., Disp: 30 tablet, Rfl: 6    ALLERGIES   Review of patient's allergies indicates no known allergies.     REVIEW OF SYSTEMS   Review of Systems  Constitutional: Negative for chills, diaphoresis, fever, malaise/fatigue and weight loss.  HENT: Negative for congestion and hearing loss.   Eyes: Negative for blurred vision and double vision.  Respiratory: Positive for cough, shortness of breath and wheezing. Negative for hemoptysis.   Cardiovascular: Negative for chest pain, palpitations and orthopnea.  Gastrointestinal: Negative for abdominal pain, heartburn, nausea and vomiting.    Genitourinary: Negative for dysuria and urgency.  Musculoskeletal: Negative for back pain, myalgias and neck pain.  Skin: Negative for rash.  Neurological: Negative for dizziness, tingling, tremors, weakness and headaches.  Endo/Heme/Allergies: Does not bruise/bleed easily.  Psychiatric/Behavioral: Negative for depression, substance abuse and suicidal ideas.  All other systems reviewed and are negative.    VS: BP 136/78 (BP Location: Left Arm, Cuff Size: Normal)   Pulse (!) 103   Ht 5\' 5"  (1.651 m)   Wt 188 lb (85.3 kg)   SpO2 96%   BMI 31.28 kg/m      PHYSICAL EXAM  Physical Exam  Constitutional: She is oriented to person, place, and time. She appears well-developed and well-nourished. No distress.  HENT:  Head: Normocephalic and atraumatic.  Mouth/Throat: No oropharyngeal exudate.  Eyes: EOM are normal. Pupils are equal, round, and reactive to light. No scleral icterus.  Neck: Normal range of motion. Neck supple.  Cardiovascular: Normal rate, regular rhythm and normal heart sounds.   No murmur heard. Pulmonary/Chest: No stridor. No respiratory distress. She has no wheezes.  Abdominal: Soft. Bowel sounds are normal.  Musculoskeletal: Normal range of motion. She exhibits no edema.  Neurological: She is alert and oriented to person, place, and time. No cranial nerve deficit.  Skin: Skin is warm. She is not diaphoretic.  Psychiatric: She has a normal mood and affect.       IMAGING    Ct Chest W Contrast  Result Date: 08/14/2016 CLINICAL DATA:  Followup pulmonary lesion.  Nonsmoker. EXAM: CT CHEST WITH CONTRAST TECHNIQUE: Multidetector CT imaging of the chest was performed during intravenous contrast administration. CONTRAST:  51mL ISOVUE-300 IOPAMIDOL (ISOVUE-300) INJECTION 61% COMPARISON:  CT 18 2016 FINDINGS: Cardiovascular: No pericardial fluid. Coronary artery calcification. Mediastinum/Nodes: No axillary or supraclavicular lymphadenopathy. No mediastinal adenopathy.  No pericardial fluid. Esophagus. Lungs/Pleura: Within RIGHT lower lobe, nodule measures 6 mm by 7 mm (image 121, series 3) compares to 5 mm x 5 mm (Image 121 series 3). LEFT lower lobe nodule measures 5 mm (image 124, series 3) compared to 4 mm. Ground-glass nodule in the RIGHT upper lobe measures 8 mm (image 55, series 3). LEFT lower lobe nodule measuring 7 mm image 105, series 3 not changed from 7 mm. Airways are normal. Upper Abdomen: Limited view of the liver, kidneys, pancreas are unremarkable. Normal adrenal glands. Musculoskeletal: No aggressive osseous lesion. IMPRESSION: 1. Apparent Interval growth of RIGHT lower lobe nodule. Differing slice thickness (2 mm versus 5 mm) could exaggerate measured differences. In relatively low risk patient (no smoking history), recommend follow-up chest CT in 6 months rather than biopsy. Size of lesion and lower lobe location would limit utility of FDG PET  imaging. 2. Small nodule at the LEFT lung base also measures 1 mm larger. Again, slice thickness could factor into differences. These results will be called to the ordering clinician or representative by the Radiologist Assistant, and communication documented in the PACS or zVision Dashboard. Electronically Signed   By: Suzy Bouchard M.D.   On: 08/14/2016 14:57   Images reviewed today  08/17/2016 With patient, RLL nodule, RUL nodular opacity Will need repeat CT chest in 6 months    ASSESSMENT/PLAN   65 yo white female with extensive exposure to second hand smoke with SOB with exertion with intermittent wheezing and mild productive cough with probable underlying COPD, with subcentimeter pulmonary  nodules  RUL nodular vague  opacity  LLL subcentimeter nodule RLL ndoule approx  6MM has been present since 2009(from CT abd /pelvis)   1.will start ANORO(LAMA/LABA) 2.albuterol as needed 3.follow up in 3 months with PFT 4.follow up CT chest in 6 months to assess lung nodules   I have personally obtained a  history, examined the patient, evaluated laboratory and independently reviewed imaging results, formulated the assessment and plan and placed orders.  The Patient requires high complexity decision making for assessment and support, frequent evaluation and titration of therapies, application of advanced monitoring technologies and extensive interpretation of multiple databases.   Patient satisfied with Plan of action and management. All questions answered  Corrin Parker, M.D.  [facility]  Medical Director [place] Director [facility] Cardio-Pulmonary Department

## 2016-08-28 ENCOUNTER — Encounter: Payer: Self-pay | Admitting: Internal Medicine

## 2016-08-28 ENCOUNTER — Other Ambulatory Visit: Payer: Self-pay

## 2016-08-28 MED ORDER — FLUTICASONE FUROATE-VILANTEROL 100-25 MCG/INH IN AEPB: 1.0000 | INHALATION_SPRAY | Freq: Every day | RESPIRATORY_TRACT | 5 refills | Status: AC

## 2016-09-07 ENCOUNTER — Institutional Professional Consult (permissible substitution): Payer: Medicare Other | Admitting: Internal Medicine

## 2016-09-08 ENCOUNTER — Other Ambulatory Visit: Payer: Self-pay | Admitting: Internal Medicine

## 2016-09-24 ENCOUNTER — Encounter: Payer: Self-pay | Admitting: Internal Medicine

## 2016-10-06 DIAGNOSIS — Z23 Encounter for immunization: Secondary | ICD-10-CM | POA: Diagnosis not present

## 2016-10-16 ENCOUNTER — Other Ambulatory Visit: Payer: Self-pay | Admitting: Internal Medicine

## 2016-10-16 NOTE — Telephone Encounter (Signed)
May I refill Sir, thank you. 

## 2016-10-16 NOTE — Telephone Encounter (Signed)
Ok to refill x 3 - ask her to have PCP refill next time

## 2016-11-19 ENCOUNTER — Ambulatory Visit (INDEPENDENT_AMBULATORY_CARE_PROVIDER_SITE_OTHER): Payer: Medicare Other | Admitting: *Deleted

## 2016-11-19 DIAGNOSIS — R911 Solitary pulmonary nodule: Secondary | ICD-10-CM

## 2016-11-19 LAB — PULMONARY FUNCTION TEST
DL/VA % pred: 86 %
DL/VA: 3.97 ml/min/mmHg/L
DLCO unc % pred: 115 %
DLCO unc: 25.62 ml/min/mmHg
FEF 25-75 Post: 2.45 L/sec
FEF 25-75 Pre: 2.16 L/sec
FEF2575-%Change-Post: 13 %
FEF2575-%Pred-Post: 120 %
FEF2575-%Pred-Pre: 106 %
FEV1-%Change-Post: 4 %
FEV1-%Pred-Post: 97 %
FEV1-%Pred-Pre: 93 %
FEV1-Post: 2.22 L
FEV1-Pre: 2.13 L
FEV1FVC-%Change-Post: 0 %
FEV1FVC-%Pred-Pre: 105 %
FEV6-%Change-Post: 4 %
FEV6-%Pred-Post: 96 %
FEV6-%Pred-Pre: 92 %
FEV6-Post: 2.74 L
FEV6-Pre: 2.62 L
FEV6FVC-%Pred-Post: 104 %
FEV6FVC-%Pred-Pre: 104 %
FVC-%Change-Post: 4 %
FVC-%Pred-Post: 92 %
FVC-%Pred-Pre: 88 %
FVC-Post: 2.74 L
FVC-Pre: 2.62 L
Post FEV1/FVC ratio: 81 %
Post FEV6/FVC ratio: 100 %
Pre FEV1/FVC ratio: 81 %
Pre FEV6/FVC Ratio: 100 %
RV % pred: 127 %
RV: 2.58 L
TLC % pred: 112 %
TLC: 5.43 L

## 2016-11-19 NOTE — Progress Notes (Signed)
SMW performed today. 

## 2016-11-19 NOTE — Progress Notes (Signed)
PFT performed today. 

## 2016-11-28 ENCOUNTER — Encounter: Payer: Self-pay | Admitting: Internal Medicine

## 2016-11-28 ENCOUNTER — Ambulatory Visit (INDEPENDENT_AMBULATORY_CARE_PROVIDER_SITE_OTHER): Payer: Medicare Other | Admitting: Internal Medicine

## 2016-11-28 VITALS — BP 138/86 | HR 67 | Ht 62.5 in | Wt 191.0 lb

## 2016-11-28 DIAGNOSIS — R918 Other nonspecific abnormal finding of lung field: Secondary | ICD-10-CM

## 2016-11-28 NOTE — Progress Notes (Signed)
[place] Consultation      Date: 11/28/2016,   MRN# [id number] Claire Thomas 1951/06/20 Code Status:  Code Status History    This patient does not have a recorded code status. Please follow your organizational policy for patients in this situation.     Hosp day:@LENGTHOFSTAYDAYS @ Referring MD: @ATDPROV @     PCP:      AdmissionWeight: 191 lb (86.6 kg)                 CurrentWeight: 191 lb (86.6 kg) Claire Thomas is a 65 y.o. old female seen in consultation for abnormal CT chest at the request of Dr. Carlean Purl.     CHIEF COMPLAINT:   Cough and SOB   HISTORY OF PRESENT ILLNESS   65 yo pleasant white female follow up for intermittent wheezing/SOB-no issues at this time   abnormal CT chest-RLL nodule seen on CT chest 07/2016 approx 6MM  previous size was about 5MM I have reviewed old CT abd/pelvis and the RLL has been present since 2009 There [place] a vague RUL opacity as well, along with 6 mm LLL which will need to be followed-follow up CT chest 3 months    chronic SOB with exertion for many years Associated with intermittent wheezing with mild productive cough with white phlegm Patient mostly has SOB while carrying stuff She has no acute SOB at this time No signs of infection at this time She is a nonsmoker, she has been exposed to extensive second hand smoke for 30 years  PFT 10/2016  shows NO obstruction and no restriction DLCO WNL Ratio 81% Fev1 2.2L 93% predicted FVC 2.9L 88% predicted      Current Medication:  Current Outpatient Prescriptions:  .  albuterol (PROVENTIL HFA;VENTOLIN HFA) 108 (90 Base) MCG/ACT inhaler, Inhale 2 puffs into the lungs every 4 (four) hours as needed for wheezing or shortness of breath., Disp: 1 Inhaler, Rfl: 6 .  Calcium-Vitamin D 600-200 MG-UNIT per tablet, Take 1 tablet by mouth 2 (two) times daily.  , Disp: , Rfl:  .  dicyclomine (BENTYL) 20 MG tablet, TAKE 1/2 TO 1 TABLET BY MOUTH 30 MINUTES BEFORE MEALS AS  NEEDED, Disp: 120 tablet, Rfl: 0 .  pantoprazole (PROTONIX) 40 MG tablet, Take 1 tablet (40 mg total) by mouth daily., Disp: 30 tablet, Rfl: 6 .  umeclidinium-vilanterol (ANORO ELLIPTA) 62.5-25 MCG/INH AEPB, Inhale 1 puff into the lungs daily., Disp: 60 each, Rfl: 5    ALLERGIES   Patient has no known allergies.     REVIEW OF SYSTEMS   Review of Systems  Constitutional: Negative for chills, diaphoresis, fever, malaise/fatigue and weight loss.  HENT: Negative for congestion and hearing loss.   Eyes: Negative for blurred vision and double vision.  Respiratory: Negative for cough, hemoptysis, shortness of breath and wheezing.   Cardiovascular: Negative for chest pain, palpitations and orthopnea.  Gastrointestinal: Negative for abdominal pain, heartburn, nausea and vomiting.  Genitourinary: Negative for dysuria.  Skin: Negative for rash.  Neurological: Negative for weakness.  Endo/Heme/Allergies: Does not bruise/bleed easily.  All other systems reviewed and are negative.    VS: BP 138/86 (BP Location: Left Arm, Cuff Size: Normal)   Pulse 67   Ht 5' 2.5" (1.588 m)   Wt 191 lb (86.6 kg)   SpO2 98%   BMI 34.38 kg/m      PHYSICAL EXAM  Physical Exam  Constitutional: She is oriented to person, place, and time. She appears well-developed and well-nourished. No  distress.  HENT:  Head: Normocephalic.  Mouth/Throat: No oropharyngeal exudate.  Eyes: No scleral icterus.  Cardiovascular: Normal rate, regular rhythm and normal heart sounds.   No murmur heard. Pulmonary/Chest: No stridor. No respiratory distress. She has no wheezes.  Musculoskeletal: Normal range of motion. She exhibits no edema.  Neurological: She is alert and oriented to person, place, and time. No cranial nerve deficit.  Skin: Skin is warm. She is not diaphoretic.  Psychiatric: She has a normal mood and affect.       IMAGING   CT chest 07/2016 With patient, RLL nodule, RUL nodular opacity Will need  repeat CT chest in 3 months    ASSESSMENT/PLAN   65 yo white female with extensive exposure to second hand smoke with SOB with exertion with intermittent wheezing and mild productive cough, her albuterol works well. Findings c/w reactive airways disease also  with subcentimeter pulmonary  nodules  RUL nodular vague  opacity  LLL subcentimeter nodule RLL ndoule approx  6MM has been present since 2009(from CT abd /pelvis)   1.will stop LAMA/LABA 2.albuterol as needed 3.follow up CT chest in 3 months to assess lung nodules   I have personally obtained a history, examined the patient, evaluated laboratory and independently reviewed imaging results, formulated the assessment and plan and placed orders.  The Patient requires high complexity decision making for assessment and support, frequent evaluation and titration of therapies, application of advanced monitoring technologies and extensive interpretation of multiple databases.   Patient satisfied with Plan of action and management. All questions answered  Corrin Parker, M.D.  [facility]  Medical Director [place] Director [facility] Cardio-Pulmonary Department

## 2016-11-28 NOTE — Patient Instructions (Signed)
STOP BREO ALBUTEROL AS NEEDED FOLLOW UP CT CHEST IN 3 MONTHS

## 2017-03-11 ENCOUNTER — Ambulatory Visit
Admission: RE | Admit: 2017-03-11 | Discharge: 2017-03-11 | Disposition: A | Discharge status: Home / Self Care | Payer: Medicare Other | Source: Ambulatory Visit | Attending: Internal Medicine | Admitting: Internal Medicine

## 2017-03-11 DIAGNOSIS — I2510 Atherosclerotic heart disease of native coronary artery without angina pectoris: Secondary | ICD-10-CM | POA: Diagnosis not present

## 2017-03-11 DIAGNOSIS — N6489 Other specified disorders of breast: Secondary | ICD-10-CM | POA: Insufficient documentation

## 2017-03-11 DIAGNOSIS — R918 Other nonspecific abnormal finding of lung field: Secondary | ICD-10-CM | POA: Diagnosis not present

## 2017-03-12 ENCOUNTER — Ambulatory Visit: Payer: Medicare Other

## 2017-03-14 ENCOUNTER — Ambulatory Visit (INDEPENDENT_AMBULATORY_CARE_PROVIDER_SITE_OTHER): Payer: Medicare Other | Admitting: Internal Medicine

## 2017-03-14 ENCOUNTER — Encounter: Payer: Self-pay | Admitting: Internal Medicine

## 2017-03-14 VITALS — BP 142/92 | HR 69 | Wt 185.0 lb

## 2017-03-14 DIAGNOSIS — R918 Other nonspecific abnormal finding of lung field: Secondary | ICD-10-CM

## 2017-03-14 NOTE — Progress Notes (Signed)
[place] [facility] Consultation      Date: 03/14/2017,   MRN# [id number] Claire Thomas 07/04/1951 Code Status:  Code Status History    This patient does not have a recorded code status. Please follow your organizational policy for patients in this situation.     Hosp day:@LENGTHOFSTAYDAYS @ Referring MD: @ATDPROV @     PCP:      Admission                  Current  Claire Thomas is a 66 y.o. old female seen in consultation for abnormal CT chest at the request of Dr. Carlean Purl.     CHIEF COMPLAINT:   Cough and SOB   HISTORY OF PRESENT ILLNESS   No acute resp issues at this time  Follow up Pulm nodules-stable at 6 months  chronic SOB with exertion for many years Associated with intermittent wheezing with mild productive cough with white phlegm Patient mostly has SOB while carrying stuff She has no acute SOB at this time No signs of infection at this time She is a nonsmoker, she has been exposed to extensive second hand smoke for 30 years Has not used albuterol in several months Overall doing well  PFT 10/2016  shows NO obstruction and no restriction DLCO WNL Ratio 81% Fev1 2.2L 93% predicted FVC 2.9L 88% predicted      Current Medication:  Current Outpatient Prescriptions:  .  albuterol (PROVENTIL HFA;VENTOLIN HFA) 108 (90 Base) MCG/ACT inhaler, Inhale 2 puffs into the lungs every 4 (four) hours as needed for wheezing or shortness of breath., Disp: 1 Inhaler, Rfl: 6 .  Calcium-Vitamin D 600-200 MG-UNIT per tablet, Take 1 tablet by mouth 2 (two) times daily.  , Disp: , Rfl:  .  dicyclomine (BENTYL) 20 MG tablet, TAKE 1/2 TO 1 TABLET BY MOUTH 30 MINUTES BEFORE MEALS AS NEEDED, Disp: 120 tablet, Rfl: 0 .  pantoprazole (PROTONIX) 40 MG tablet, Take 1 tablet (40 mg total) by mouth daily., Disp: 30 tablet, Rfl: 6 .  umeclidinium-vilanterol (ANORO ELLIPTA) 62.5-25 MCG/INH AEPB, Inhale 1 puff into the lungs daily., Disp: 60 each, Rfl: 5    ALLERGIES    Patient has no known allergies.     REVIEW OF SYSTEMS   Review of Systems  Constitutional: Negative for chills, diaphoresis, fever, malaise/fatigue and weight loss.  HENT: Negative for congestion and hearing loss.   Eyes: Negative for blurred vision and double vision.  Respiratory: Negative for cough, hemoptysis, shortness of breath and wheezing.   Cardiovascular: Negative for chest pain, palpitations and orthopnea.  Skin: Negative for rash.  Neurological: Negative for weakness.  All other systems reviewed and are negative.   BP (!) 142/92 (BP Location: Left Arm, Cuff Size: Normal)   Pulse 69   Wt 185 lb (83.9 kg)   SpO2 100%   BMI 33.30 kg/m     PHYSICAL EXAM  Physical Exam  Constitutional: She is oriented to person, place, and time. She appears well-developed and well-nourished. No distress.  Cardiovascular: Normal rate, regular rhythm and normal heart sounds.   No murmur heard. Pulmonary/Chest: Effort normal and breath sounds normal. No stridor. No respiratory distress. She has no wheezes.  Musculoskeletal: Normal range of motion. She exhibits no edema.  Neurological: She is alert and oriented to person, place, and time. No cranial nerve deficit.  Skin: Skin is warm. She is not diaphoretic.  Psychiatric: She has a normal mood and affect.       IMAGING  CT chest 07/2016 With patient, RLL nodule, RUL nodular opacity Will need repeat CT chest in 3 months CT chest 02/2017 stable b/l pulm nodules  CTc hest 02/2017  ASSESSMENT/PLAN   66 yo white female with extensive exposure to second hand smoke with SOB with exertion with intermittent wheezing and mild productive cough, her albuterol works well. Findings c/w reactive airways disease also  with subcentimeter pulmonary  nodules   CT chest 3/19 reviewed: stable Nodules, no change over past 6 months  RUL nodular vague  opacity -stable over 6 months LLL subcentimeter nodule-stable over 6 months RLL nodule  approx  6MM has been present since 2009(from CT abd /pelvis)-stable over 6 months   1.will stop LAMA/LABA 2.albuterol as needed 3.follow up CT chest in 6 months to assess lung nodules   Patient satisfied with Plan of action and management. All questions answered  Corrin Parker, M.D.  [facility]  Medical Director [place] Director [facility] Cardio-Pulmonary Department

## 2017-03-14 NOTE — Patient Instructions (Signed)
Follow up CT chest in 6 months 

## 2017-04-30 DIAGNOSIS — D2261 Melanocytic nevi of right upper limb, including shoulder: Secondary | ICD-10-CM | POA: Diagnosis not present

## 2017-04-30 DIAGNOSIS — D225 Melanocytic nevi of trunk: Secondary | ICD-10-CM | POA: Diagnosis not present

## 2017-04-30 DIAGNOSIS — L821 Other seborrheic keratosis: Secondary | ICD-10-CM | POA: Diagnosis not present

## 2017-04-30 DIAGNOSIS — D2272 Melanocytic nevi of left lower limb, including hip: Secondary | ICD-10-CM | POA: Diagnosis not present

## 2017-05-08 DIAGNOSIS — R739 Hyperglycemia, unspecified: Secondary | ICD-10-CM | POA: Diagnosis not present

## 2017-05-08 DIAGNOSIS — Z136 Encounter for screening for cardiovascular disorders: Secondary | ICD-10-CM | POA: Diagnosis not present

## 2017-05-08 DIAGNOSIS — R5383 Other fatigue: Secondary | ICD-10-CM | POA: Diagnosis not present

## 2017-05-16 DIAGNOSIS — K219 Gastro-esophageal reflux disease without esophagitis: Secondary | ICD-10-CM | POA: Diagnosis not present

## 2017-05-16 DIAGNOSIS — J302 Other seasonal allergic rhinitis: Secondary | ICD-10-CM | POA: Diagnosis not present

## 2017-05-16 DIAGNOSIS — Z780 Asymptomatic menopausal state: Secondary | ICD-10-CM | POA: Diagnosis not present

## 2017-09-10 ENCOUNTER — Telehealth: Payer: Self-pay | Admitting: Internal Medicine

## 2017-09-10 NOTE — Telephone Encounter (Signed)
Pt is scheduled to see Dr. Mortimer Fries on 10/13. She asks if she needs a CT of chest 1 st? Please advise.

## 2017-09-10 NOTE — Telephone Encounter (Signed)
Pt called and states she has not been scheduled for CT chest she is to have prior to her f/u appt. Pt is scheduled 10/13 to see DK. Please schedule CT and call pt. Thanks.

## 2017-09-11 NOTE — Telephone Encounter (Signed)
Contacted patient and scheduled CT Chest for 10/02/17 at 8:30 at [facility].  Pt is aware of appointment. Nothing else needed at this time. [name]

## 2017-10-02 ENCOUNTER — Ambulatory Visit
Admission: RE | Admit: 2017-10-02 | Discharge: 2017-10-02 | Disposition: A | Discharge status: Home / Self Care | Payer: Medicare Other | Source: Ambulatory Visit | Attending: Internal Medicine | Admitting: Internal Medicine

## 2017-10-02 DIAGNOSIS — R918 Other nonspecific abnormal finding of lung field: Secondary | ICD-10-CM | POA: Insufficient documentation

## 2017-10-02 DIAGNOSIS — I2510 Atherosclerotic heart disease of native coronary artery without angina pectoris: Secondary | ICD-10-CM | POA: Insufficient documentation

## 2017-10-07 ENCOUNTER — Ambulatory Visit (INDEPENDENT_AMBULATORY_CARE_PROVIDER_SITE_OTHER): Payer: Medicare Other | Admitting: Internal Medicine

## 2017-10-07 ENCOUNTER — Encounter: Payer: Self-pay | Admitting: Internal Medicine

## 2017-10-07 VITALS — BP 124/88 | HR 63 | Ht 62.5 in | Wt 172.0 lb

## 2017-10-07 DIAGNOSIS — G4719 Other hypersomnia: Secondary | ICD-10-CM

## 2017-10-07 DIAGNOSIS — R918 Other nonspecific abnormal finding of lung field: Secondary | ICD-10-CM | POA: Diagnosis not present

## 2017-10-07 DIAGNOSIS — J4520 Mild intermittent asthma, uncomplicated: Secondary | ICD-10-CM | POA: Diagnosis not present

## 2017-10-07 NOTE — Addendum Note (Signed)
Addended by: Oscar La R on: 10/07/2017 09:49 AM   Modules accepted: Orders

## 2017-10-07 NOTE — Progress Notes (Signed)
[place] [facility] Consultation      Date: 10/07/2017,   MRN# [id number] Claire Thomas 09-27-1951 Code Status:  Code Status History    This patient does not have a recorded code status. Please follow your organizational policy for patients in this situation.     Hosp day:@LENGTHOFSTAYDAYS @ Referring MD: @ATDPROV @     PCP:      AdmissionWeight: 172 lb (78 kg)                 CurrentWeight: 172 lb (78 kg) CALIOPE RUPPERT is a 66 y.o. old female seen in consultation for abnormal CT chest at the request of Dr. Carlean Purl.     CHIEF COMPLAINT:   Cough and SOB   HISTORY OF PRESENT ILLNESS   No acute resp issues at this time  Follow up Pulm Right upper lobe nodule has slightly increased in size over the last 6 months will need further follow-up for interval changes  chronic SOB with exertion for many years Associated with intermittent wheezing with mild productive cough with white phlegm Patient mostly has SOB while carrying stuff She has no acute SOB at this time No signs of infection at this time She is a nonsmoker, she has been exposed to extensive second hand smoke for 30 years Has not used albuterol in several months, does not use ANORO Overall doing well   Patient states that she has increased fatigue and tiredness over the last several months next line patient does have snoring issues that she has been told about Epworth sleep score 11 I have spleen to patient that she may need sleep study to assess for sleep apnea   PFT 10/2016  shows NO obstruction and no restriction DLCO WNL Ratio 81% Fev1 2.2L 93% predicted FVC 2.9L 88% predicted      Current Medication:  Current Outpatient Prescriptions:  .  albuterol (PROVENTIL HFA;VENTOLIN HFA) 108 (90 Base) MCG/ACT inhaler, Inhale 2 puffs into the lungs every 4 (four) hours as needed for wheezing or shortness of breath., Disp: 1 Inhaler, Rfl: 6 .  Calcium-Vitamin D 600-200 MG-UNIT per tablet, Take 1  tablet by mouth 2 (two) times daily.  , Disp: , Rfl:  .  dicyclomine (BENTYL) 20 MG tablet, TAKE 1/2 TO 1 TABLET BY MOUTH 30 MINUTES BEFORE MEALS AS NEEDED, Disp: 120 tablet, Rfl: 0 .  pantoprazole (PROTONIX) 40 MG tablet, Take 1 tablet (40 mg total) by mouth daily., Disp: 30 tablet, Rfl: 6 .  umeclidinium-vilanterol (ANORO ELLIPTA) 62.5-25 MCG/INH AEPB, Inhale 1 puff into the lungs daily., Disp: 60 each, Rfl: 5    ALLERGIES   Patient has no known allergies.     REVIEW OF SYSTEMS   Review of Systems  Constitutional: Negative for chills, diaphoresis, fever, malaise/fatigue and weight loss.  HENT: Negative for congestion and hearing loss.   Eyes: Negative for blurred vision and double vision.  Respiratory: Negative for cough, hemoptysis, shortness of breath and wheezing.   Cardiovascular: Negative for chest pain, palpitations and orthopnea.  Skin: Negative for rash.  Neurological: Negative for weakness.  All other systems reviewed and are negative.   Ht 5' 2.5" (1.588 m)   Wt 172 lb (78 kg)   BMI 30.96 kg/m   BP 124/88 (BP Location: Left Arm, Cuff Size: Normal)   Pulse 63   Ht 5' 2.5" (1.588 m)   Wt 172 lb (78 kg)   SpO2 99%   BMI 30.96 kg/m     PHYSICAL EXAM  Physical Exam  Constitutional: She is oriented to person, place, and time. She appears well-developed and well-nourished. No distress.  Cardiovascular: Normal rate, regular rhythm and normal heart sounds.   No murmur heard. Pulmonary/Chest: Effort normal and breath sounds normal. No stridor. No respiratory distress. She has no wheezes.  Musculoskeletal: Normal range of motion. She exhibits no edema.  Neurological: She is alert and oriented to person, place, and time. No cranial nerve deficit.  Skin: Skin is warm. She is not diaphoretic.  Psychiatric: She has a normal mood and affect.       IMAGING   CT chest 07/2016 With patient, RLL nodule, RUL nodular opacity Will need repeat CT chest in 3 months CT  chest 02/2017 stable b/l pulm nodules  CT chest 10/02/2017 reviewed with patient The right upper lobe nodule opacity seems to have slightly increased in size All other nodules are stable CTc hest 02/2017  ASSESSMENT/PLAN    66 yo white female with extensive exposure to second hand smoke with SOB with exertion with intermittent wheezing and mild productive cough, her albuterol works well. Findings c/w reactive airways disease also  with subcentimeter pulmonary  Nodules Patient also with excessive daytime sleepiness along with fatigue and tiredness with snoring patient will need to have sleep study to assess for obstructive sleep apnea   CT chest 3/19 reviewed: stable Nodules, no change over past 6 months CT chest 10/02/17 reviewed-right upper lobe nodular opacity has increased in size from 6 mm to 9 mm however will need to repeat CT chest in several months to assess interval changes   RUL nodular vague  opacity -Slightly increased in size over the last 3 months Will need repeat CT chest follow-up in the next several months LLL subcentimeter nodule-stable over 6 months RLL nodule approx  6MM has been present since 2009(from CT abd /pelvis)-stable over 6 months   1.will stop LAMA/LABA-advised to use albuterol as needed prior to exertion 2.albuterol as needed 3.follow up CT chest in 3 months to assess lung nodules 4. Obtain split-night sleep study to assess for sleep apnea  Patient satisfied with Plan of action and management. All questions answered  Corrin Parker, M.D.  [facility]  Medical Director [place] Director [facility] Cardio-Pulmonary Department

## 2017-10-07 NOTE — Patient Instructions (Addendum)
Follow up CT chest in 2 months Use albuterol prior to exertion Sleep study needed

## 2017-10-21 ENCOUNTER — Encounter: Payer: Self-pay | Admitting: Internal Medicine

## 2017-10-21 DIAGNOSIS — G4719 Other hypersomnia: Secondary | ICD-10-CM

## 2017-10-25 ENCOUNTER — Telehealth: Payer: Self-pay | Admitting: *Deleted

## 2017-10-25 DIAGNOSIS — G4733 Obstructive sleep apnea (adult) (pediatric): Secondary | ICD-10-CM

## 2017-10-25 NOTE — Telephone Encounter (Signed)
Patient returning call.

## 2017-10-25 NOTE — Telephone Encounter (Signed)
LMOM for pt to return call for sleep study results. 

## 2017-10-28 NOTE — Telephone Encounter (Signed)
Patient aware of results. Orders entered. nothing further needed.

## 2017-11-27 ENCOUNTER — Encounter: Payer: Self-pay | Admitting: Internal Medicine

## 2018-01-13 ENCOUNTER — Ambulatory Visit
Admission: RE | Admit: 2018-01-13 | Discharge: 2018-01-13 | Disposition: A | Discharge status: Home / Self Care | Payer: Medicare Other | Source: Ambulatory Visit | Attending: Internal Medicine | Admitting: Internal Medicine

## 2018-01-13 DIAGNOSIS — J432 Centrilobular emphysema: Secondary | ICD-10-CM | POA: Insufficient documentation

## 2018-01-13 DIAGNOSIS — I2510 Atherosclerotic heart disease of native coronary artery without angina pectoris: Secondary | ICD-10-CM | POA: Insufficient documentation

## 2018-01-13 DIAGNOSIS — R918 Other nonspecific abnormal finding of lung field: Secondary | ICD-10-CM | POA: Diagnosis not present

## 2018-01-20 ENCOUNTER — Ambulatory Visit (INDEPENDENT_AMBULATORY_CARE_PROVIDER_SITE_OTHER): Payer: Medicare Other | Admitting: Internal Medicine

## 2018-01-20 ENCOUNTER — Encounter: Payer: Self-pay | Admitting: Internal Medicine

## 2018-01-20 VITALS — BP 152/90 | HR 70 | Ht 62.5 in | Wt 178.0 lb

## 2018-01-20 DIAGNOSIS — J449 Chronic obstructive pulmonary disease, unspecified: Secondary | ICD-10-CM | POA: Diagnosis not present

## 2018-01-20 DIAGNOSIS — G4733 Obstructive sleep apnea (adult) (pediatric): Secondary | ICD-10-CM

## 2018-01-20 NOTE — Progress Notes (Signed)
[place] [facility] Consultation      Date: 01/20/2018,   MRN# [id number] Claire Thomas 09-14-1951    AdmissionWeight: 178 lb (80.7 kg)                 CurrentWeight: 178 lb (80.7 kg) Claire Thomas is a 67 y.o. old female seen in consultation for abnormal CT chest at the request of Dr. Carlean Purl.     CHIEF COMPLAINT:   Follow up COPD and OSA   HISTORY OF PRESENT ILLNESS   No acute resp issues at this time HST on 10.29.18 confiems Dx of OSA with AHI 13 with desaturations  Follow up Pulm Right upper lobe nodule has slightly increased in size over the last 6 months will need further follow-up for interval changes-bu CT chest 1.21.19 shows stable nodules since 2017  chronic SOB with exertion for many years-not worsening Patient mostly has SOB while carrying stuff She has no acute SOB at this time No signs of infection at this time She is a nonsmoker, she has been exposed to extensive second hand smoke for 30 years Has not used albuterol in several months, Overall doing well   Patient states that she has increased fatigue and tiredness has decreased since auto CPAP therapy 5-20 cm h20 AHI down to 1   PFT 10/2016  shows NO obstruction and no restriction DLCO WNL Ratio 81% Fev1 2.2L 93% predicted FVC 2.9L 88% predicted      Current Medication:  Current Outpatient Medications:  .  albuterol (PROVENTIL HFA;VENTOLIN HFA) 108 (90 Base) MCG/ACT inhaler, Inhale 2 puffs into the lungs every 4 (four) hours as needed for wheezing or shortness of breath., Disp: 1 Inhaler, Rfl: 6 .  Calcium-Vitamin D 600-200 MG-UNIT per tablet, Take 1 tablet by mouth 2 (two) times daily.  , Disp: , Rfl:  .  dicyclomine (BENTYL) 20 MG tablet, TAKE 1/2 TO 1 TABLET BY MOUTH 30 MINUTES BEFORE MEALS AS NEEDED, Disp: 120 tablet, Rfl: 0 .  pantoprazole (PROTONIX) 40 MG tablet, Take 1 tablet (40 mg total) by mouth daily., Disp: 30 tablet, Rfl: 6 .  umeclidinium-vilanterol (ANORO ELLIPTA)  62.5-25 MCG/INH AEPB, Inhale 1 puff into the lungs daily., Disp: 60 each, Rfl: 5    ALLERGIES   Patient has no known allergies.     REVIEW OF SYSTEMS   Review of Systems  Constitutional: Negative for chills, diaphoresis, fever, malaise/fatigue and weight loss.  HENT: Negative for congestion and hearing loss.   Eyes: Negative for blurred vision and double vision.  Respiratory: Negative for cough, hemoptysis, shortness of breath and wheezing.   Cardiovascular: Negative for chest pain, palpitations and orthopnea.  Skin: Negative for rash.  Neurological: Negative for weakness.  All other systems reviewed and are negative.   BP (!) 152/90 (BP Location: Left Arm, Cuff Size: Normal)   Pulse 70   Ht 5' 2.5" (1.588 m)   Wt 178 lb (80.7 kg)   SpO2 99%   BMI 32.04 kg/m   BP (!) 152/90 (BP Location: Left Arm, Cuff Size: Normal)   Pulse 70   Ht 5' 2.5" (1.588 m)   Wt 178 lb (80.7 kg)   SpO2 99%   BMI 32.04 kg/m     PHYSICAL EXAM  Physical Exam  Constitutional: She is oriented to person, place, and time. She appears well-developed and well-nourished. No distress.  Cardiovascular: Normal rate, regular rhythm and normal heart sounds.  No murmur heard. Pulmonary/Chest: Effort normal and breath sounds normal.  No stridor. No respiratory distress. She has no wheezes.  Musculoskeletal: Normal range of motion. She exhibits no edema.  Neurological: She is alert and oriented to person, place, and time. No cranial nerve deficit.  Skin: Skin is warm. She is not diaphoretic.  Psychiatric: She has a normal mood and affect.       IMAGING   CT chest 07/2016 With patient, RLL nodule, RUL nodular opacity Will need repeat CT chest in 3 months CT chest 02/2017 stable b/l pulm nodules  CT chest 10/02/2017 reviewed with patient The right upper lobe nodule opacity seems to have slightly increased in size All other nodules are stable  CT chest 1.21.19 Nodules stable since 2017 Will  repeat CT chest in 1 year CTc hest 02/2017  ASSESSMENT/PLAN    67 yo white female with extensive exposure to second hand smoke with SOB with exertion with intermittent wheezing and mild productive cough, her albuterol works well.  Findings c/w reactive airways disease also  with subcentimeter pulmonary  Nodules Patient also with excessive daytime sleepiness along with fatigue and tiredness dx of OSA  CT chest 3/19 reviewed: stable Nodules, no change over past 6 months CT chest 10/02/17 reviewed-right upper lobe nodular opacity has increased in size from 6 mm to 9 mm however will need to repeat CT chest in several months to assess interval changes CT chest 1.21.19 stable nodules since 2017-follow up in 1 year  RUL nodular vague  opacity -Slightly increased in size over the last 3 months Will need repeat CT chest follow-up in the next several months stable last 3 months LLL subcentimeter nodule-stable over 6 months RLL nodule approx  6MM has been present since 2009(from CT abd /pelvis)-stable over 6 months   1.will stop LAMA/LABA-advised to use albuterol as needed prior to exertion 2.albuterol as needed 3.follow up CT chest in 1 year to assess lung nodules 4. Continue OSA therapy with autoCPAP therapy  Patient satisfied with Plan of action and management. All questions answered  Corrin Parker, M.D.  [facility]  Medical Director [place] Director [facility] Cardio-Pulmonary Department

## 2018-01-20 NOTE — Patient Instructions (Signed)
Follow up CT chest  In 1 year HOLD ANoro and assess breathing Continue AutoCPAP therapy for sleep apnea

## 2018-02-19 DIAGNOSIS — Z9289 Personal history of other medical treatment: Secondary | ICD-10-CM | POA: Diagnosis not present

## 2018-02-19 DIAGNOSIS — Z1231 Encounter for screening mammogram for malignant neoplasm of breast: Secondary | ICD-10-CM | POA: Diagnosis not present

## 2018-02-25 ENCOUNTER — Encounter: Payer: Self-pay | Admitting: Gastroenterology

## 2018-02-25 ENCOUNTER — Ambulatory Visit (INDEPENDENT_AMBULATORY_CARE_PROVIDER_SITE_OTHER): Payer: Medicare Other | Admitting: Gastroenterology

## 2018-02-25 VITALS — BP 114/70 | HR 64 | Ht 64.0 in | Wt 181.4 lb

## 2018-02-25 DIAGNOSIS — R1310 Dysphagia, unspecified: Secondary | ICD-10-CM

## 2018-02-25 DIAGNOSIS — Z86010 Personal history of colonic polyps: Secondary | ICD-10-CM | POA: Diagnosis not present

## 2018-02-25 DIAGNOSIS — K582 Mixed irritable bowel syndrome: Secondary | ICD-10-CM

## 2018-02-25 HISTORY — DX: Dysphagia, unspecified: R13.10

## 2018-02-25 NOTE — Progress Notes (Addendum)
02/25/2018 Claire Thomas [id number] October 08, 1951   HISTORY OF PRESENT ILLNESS: This is a 67 year old female who is known to Dr. Carlean Purl.  She has history of GERD and IBS.  She presents her office today with a couple different complaints.  First, she complains of difficulty swallowing.  She says that this occurs once every couple of weeks with foods such as bread and meats.  She is on Protonix 40 mg daily.  She has history of complaints of dysphasia in the past but is only been found to have dysmotility, tortuous esophagus, small hiatal hernia. She is also due for colonoscopy.  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  She has a history of a adenoma in October 2010 that was greater than 10 mm, therefore, this time repeat colonoscopy was recommended in 5 years.  She denies seeing blood in her stool.  She reports a history of IBS and tells me that her stools alternate from loose with urgency and sometimes incontinence to constipation and straining, some days she does not have a bowel movement she really has to "make herself go".  She tells me that sometimes she will be at work first thing in the morning and have sudden urges to have a bowel movement sometimes, 5 times at times, with episodes where she is not able to make it to the bathroom.   Past Medical History:  Diagnosis Date  . GERD (gastroesophageal reflux disease)   . IBS (irritable bowel syndrome)    Past Surgical History:  Procedure Laterality Date  . CARDIAC CATHETERIZATION  07/19/2014   [facility]  . CARPAL TUNNEL RELEASE Bilateral   . COLONOSCOPY    . ESOPHAGOGASTRODUODENOSCOPY    . MENISCUS REPAIR Left   . PARTIAL HYSTERECTOMY      reports that  has never smoked. she has never used smokeless tobacco. She reports that she drinks alcohol. She reports that she does not use drugs. family history includes AAA (abdominal aortic aneurysm) in her father;  Colon cancer in her mother; Diabetes in her paternal grandmother; Hyperlipidemia in her father and mother; Hypertension in her father and mother; Prostate cancer in her father; Pulmonary fibrosis in her mother. No Known Allergies    Outpatient Encounter Medications as of 02/25/2018  Medication Sig  . albuterol (PROVENTIL HFA;VENTOLIN HFA) 108 (90 Base) MCG/ACT inhaler Inhale 2 puffs into the lungs every 4 (four) hours as needed for wheezing or shortness of breath.  . Calcium-Vitamin D 600-200 MG-UNIT per tablet Take 1 tablet by mouth 2 (two) times daily.    Marland Kitchen dicyclomine (BENTYL) 20 MG tablet TAKE 1/2 TO 1 TABLET BY MOUTH 30 MINUTES BEFORE MEALS AS NEEDED  . pantoprazole (PROTONIX) 40 MG tablet Take 1 tablet (40 mg total) by mouth daily.  Marland Kitchen umeclidinium-vilanterol (ANORO ELLIPTA) 62.5-25 MCG/INH AEPB Inhale 1 puff into the lungs daily. (Patient taking differently: Inhale 1 puff into the lungs daily as needed. )   No facility-administered encounter medications on file as of 02/25/2018.      REVIEW OF SYSTEMS  : All other systems reviewed and negative except where noted in the History of Present Illness.   PHYSICAL EXAM: BP 114/70   Pulse 64   Ht 5\' 4"  (1.626 m)   Wt 181 lb 6 oz (82.3 kg)   BMI 31.13 kg/m  General: Well developed white female in no acute distress Head: Normocephalic and  atraumatic Eyes:  Sclerae anicteric, conjunctiva pink. Ears: Normal auditory acuity Lungs: Clear throughout to auscultation; no increased WOB. Heart: Regular rate and rhythm; no M/R/G. Abdomen: Soft, non-distended.  BS present.  Non-tender. Rectal:  Will be done at the time of colonoscopy. Musculoskeletal: Symmetrical with no gross deformities  Skin: No lesions on visible extremities Extremities: No edema  Neurological: Alert oriented x 4, grossly non-focal Psychological:  Alert and cooperative. Normal mood and affect  ASSESSMENT AND PLAN: *Dysphagia:  Intermittent to solid food, occurs once every  couple of weeks.  Suspect dysphagia as she has a history of this with tortuous esophagus.  Will check barium esophagram. *Personal history of colon polyps:  Last colonoscopy was 09/2012 with prior adenoma over 10 mm.  Repeat was recommended at 5 years.  Will schedule with Dr. Carlean Purl. *Alterating loose stools with urgency and incontinence and constipation:  Likely IBS.  Will try daily powder fiber such as Benefiber or Citrucel daily to start.  Likely has some sphincter incompetence.  ? If random biopsies can be obtained during colonoscopy to rule out microscopic colitis.  **The risks, benefits, and alternatives to colonoscopy were discussed with the patient and she consents to proceed.   CC:  Marinda Elk, MD  Agree with Ms. Barbarita Hutmacher's management.  Gatha Mayer, MD, [facility]

## 2018-02-25 NOTE — Patient Instructions (Signed)
Try a daily powder fiber such as Benefiber or Citrucel.    You have been scheduled for a Barium Esophogram at [facility] (1st floor of the hospital) on 03-03-18 at 8 am. Please arrive 15 minutes prior to your appointment for registration. Make certain not to have anything to eat or drink 6 hours prior to your test. If you need to reschedule for any reason, please contact radiology at [phone] to do so. __________________________________________________________________ A barium swallow is an examination that concentrates on views of the esophagus. This tends to be a double contrast exam (barium and two liquids which, when combined, create a gas to distend the wall of the oesophagus) or single contrast (non-ionic iodine based). The study is usually tailored to your symptoms so a good history is essential. Attention is paid during the study to the form, structure and configuration of the esophagus, looking for functional disorders (such as aspiration, dysphagia, achalasia, motility and reflux) EXAMINATION You may be asked to change into a gown, depending on the type of swallow being performed. A radiologist and radiographer will perform the procedure. The radiologist will advise you of the type of contrast selected for your procedure and direct you during the exam. You will be asked to stand, sit or lie in several different positions and to hold a small amount of fluid in your mouth before being asked to swallow while the imaging is performed .In some instances you may be asked to swallow barium coated marshmallows to assess the motility of a solid food bolus. The exam can be recorded as a digital or video fluoroscopy procedure. POST PROCEDURE It will take 1-2 days for the barium to pass through your system. To facilitate this, it is important, unless otherwise directed, to increase your fluids for the next 24-48hrs and to resume your normal diet.  This test typically takes about 30  minutes to perform. __________________________________________________________________________________  Claire Thomas have been scheduled for a colonoscopy. Please follow written instructions given to you at your visit today.  Please pick up your prep supplies at the pharmacy within the next 1-3 days. If you use inhalers (even only as needed), please bring them with you on the day of your procedure. Your physician has requested that you go to www.startemmi.com and enter the access code given to you at your visit today. This web site gives a general overview about your procedure. However, you should still follow specific instructions given to you by our office regarding your preparation for the procedure.

## 2018-03-03 ENCOUNTER — Ambulatory Visit
Admission: RE | Admit: 2018-03-03 | Discharge: 2018-03-03 | Disposition: A | Discharge status: Home / Self Care | Payer: Medicare Other | Source: Ambulatory Visit | Attending: Gastroenterology | Admitting: Gastroenterology

## 2018-03-03 DIAGNOSIS — R1310 Dysphagia, unspecified: Secondary | ICD-10-CM | POA: Diagnosis present

## 2018-03-03 DIAGNOSIS — K219 Gastro-esophageal reflux disease without esophagitis: Secondary | ICD-10-CM | POA: Insufficient documentation

## 2018-05-01 ENCOUNTER — Encounter: Payer: Medicare Other | Admitting: Internal Medicine

## 2018-05-07 DIAGNOSIS — G4733 Obstructive sleep apnea (adult) (pediatric): Secondary | ICD-10-CM | POA: Diagnosis not present

## 2018-05-07 DIAGNOSIS — R0789 Other chest pain: Secondary | ICD-10-CM | POA: Diagnosis not present

## 2018-05-07 DIAGNOSIS — K219 Gastro-esophageal reflux disease without esophagitis: Secondary | ICD-10-CM | POA: Diagnosis not present

## 2018-05-07 DIAGNOSIS — Z9989 Dependence on other enabling machines and devices: Secondary | ICD-10-CM | POA: Diagnosis not present

## 2018-06-12 ENCOUNTER — Telehealth: Payer: Self-pay | Admitting: Internal Medicine

## 2018-06-12 NOTE — Telephone Encounter (Signed)
Patient scheduled for 6 m fu in July and wants to know if she has to have any testing prior to ov .  Please call if needed.

## 2018-06-12 NOTE — Telephone Encounter (Signed)
Returned call to patient and made aware no pending test prior to appointment. Nothing further needed.

## 2018-06-19 ENCOUNTER — Telehealth: Payer: Self-pay

## 2018-06-19 NOTE — Telephone Encounter (Signed)
Copied from [place] [phone]. Topic: General - Other >> Jun 19, 2018 12:51 PM Yvette Rack wrote: Reason for CRM: New pt appt scheduled for 09/04/18 at 8:30 am

## 2018-06-24 ENCOUNTER — Other Ambulatory Visit: Payer: Self-pay | Admitting: Internal Medicine

## 2018-06-25 ENCOUNTER — Other Ambulatory Visit: Payer: Self-pay | Admitting: Internal Medicine

## 2018-06-27 ENCOUNTER — Telehealth: Payer: Self-pay | Admitting: Internal Medicine

## 2018-06-27 MED ORDER — ALBUTEROL SULFATE HFA 108 (90 BASE) MCG/ACT IN AERS: 2.0000 | INHALATION_SPRAY | RESPIRATORY_TRACT | 5 refills | Status: DC | PRN

## 2018-06-27 NOTE — Telephone Encounter (Signed)
1.will stop LAMA/LABA-advised to use albuterol as needed prior to exertion 2.albuterol as needed 3.follow up CT chest in 1 year to assess lung nodules 4. Continue OSA therapy with autoCPAP therapy   Called patient and discussed Anoro being d/c at last visit. Patient states it the Albuterol that she needed refilled. Albuterol has been refilled. Nothing further needed.

## 2018-06-27 NOTE — Telephone Encounter (Signed)
°*  STAT* If patient is at the pharmacy, call can be transferred to refill team.   1. Which medications need to be refilled? (please list name of each medication and dose if known) ANORO ELLIPTA  2. Which pharmacy/location (including street and city if local pharmacy) is medication to be sent to? [facility] [place]    3. Do they need a 30 day or 90 day supply? 30 day

## 2018-07-15 ENCOUNTER — Telehealth: Payer: Self-pay | Admitting: Internal Medicine

## 2018-07-15 NOTE — Telephone Encounter (Signed)
Left message on machine to call back  

## 2018-07-16 ENCOUNTER — Telehealth: Payer: Self-pay

## 2018-07-16 NOTE — Telephone Encounter (Signed)
Just needs a colon.

## 2018-07-16 NOTE — Telephone Encounter (Signed)
I left her a message that per Alonza Bogus, PA-C, she only needs to schedule a colonoscopy.

## 2018-07-16 NOTE — Telephone Encounter (Signed)
Pt is returning your call

## 2018-07-16 NOTE — Telephone Encounter (Signed)
I am not sure how to answer her question.  Please advise.  Thank you.

## 2018-07-21 ENCOUNTER — Ambulatory Visit (INDEPENDENT_AMBULATORY_CARE_PROVIDER_SITE_OTHER): Payer: Medicare Other | Admitting: Internal Medicine

## 2018-07-21 ENCOUNTER — Encounter: Payer: Self-pay | Admitting: Internal Medicine

## 2018-07-21 VITALS — BP 148/82 | HR 74 | Resp 16 | Ht 64.0 in | Wt 187.0 lb

## 2018-07-21 DIAGNOSIS — G4733 Obstructive sleep apnea (adult) (pediatric): Secondary | ICD-10-CM

## 2018-07-21 DIAGNOSIS — R911 Solitary pulmonary nodule: Secondary | ICD-10-CM

## 2018-07-21 DIAGNOSIS — J4520 Mild intermittent asthma, uncomplicated: Secondary | ICD-10-CM

## 2018-07-21 NOTE — Progress Notes (Signed)
[facility] Consultation      Date: 07/21/2018,   MRN# [id number] ESSANCE GATTI 26-Feb-1951    Admission                  Current  CARLINDA OHLSON is a 67 y.o. old female seen in consultation for abnormal CT chest at the request of Dr. Carlean Purl.     CHIEF COMPLAINT:   Follow up COPD and OSA   HISTORY OF PRESENT ILLNESS   No acute resp issues at this time HST on 10.29.18 confiems Dx of OSA with AHI 13 with desaturations Doing well with AUTOCPAP therapy states a little too mcuh pressure  She is compliant 90% 27/30 days >4 hrs 80% AHI down to 0.6   Follow up Pulm Right upper lobe nodule has slightly increased in size-will need follow up CT chest in 6 months    Intermittent SOB She has no acute SOB at this time No signs of infection at this time She is a nonsmoker, she has been exposed to extensive second hand smoke for 30 years Has not used albuterol in 6 months Overall doing well   Patient states that she has increased fatigue and tiredness has decreased since auto CPAP therapy  PFT 10/2016  shows NO obstruction and no restriction DLCO WNL Ratio 81% Fev1 2.2L 93% predicted FVC 2.9L 88% predicted      Current Medication:  Current Outpatient Medications:  .  albuterol (PROVENTIL HFA;VENTOLIN HFA) 108 (90 Base) MCG/ACT inhaler, Inhale 2 puffs into the lungs every 4 (four) hours as needed for wheezing or shortness of breath., Disp: 1 Inhaler, Rfl: 5 .  Calcium-Vitamin D 600-200 MG-UNIT per tablet, Take 1 tablet by mouth 2 (two) times daily.  , Disp: , Rfl:  .  dicyclomine (BENTYL) 20 MG tablet, TAKE 1/2 TO 1 TABLET BY MOUTH 30 MINUTES BEFORE MEALS AS NEEDED, Disp: 120 tablet, Rfl: 0 .  pantoprazole (PROTONIX) 40 MG tablet, Take 1 tablet (40 mg total) by mouth daily., Disp: 30 tablet, Rfl: 6 .  umeclidinium-vilanterol (ANORO ELLIPTA) 62.5-25 MCG/INH AEPB, Inhale 1 puff into the lungs daily. (Patient taking differently: Inhale 1 puff into the lungs  daily as needed. ), Disp: 60 each, Rfl: 5    ALLERGIES   Patient has no known allergies.     REVIEW OF SYSTEMS   Review of Systems  Constitutional: Negative for chills, diaphoresis, fever, malaise/fatigue and weight loss.  HENT: Negative for congestion and hearing loss.   Eyes: Negative for blurred vision and double vision.  Respiratory: Negative for cough, hemoptysis, shortness of breath and wheezing.   Cardiovascular: Negative for chest pain, palpitations and orthopnea.  Skin: Negative for rash.  Neurological: Negative for weakness.  All other systems reviewed and are negative.   BP (!) 148/82 (BP Location: Left Arm, Cuff Size: Large)   Pulse 74   Resp 16   Ht 5\' 4"  (1.626 m)   Wt 187 lb (84.8 kg)   SpO2 95%   BMI 32.10 kg/m     PHYSICAL EXAM  Physical Exam  Constitutional: She is oriented to person, place, and time. She appears well-developed and well-nourished. No distress.  Cardiovascular: Normal rate, regular rhythm and normal heart sounds.  No murmur heard. Pulmonary/Chest: Effort normal and breath sounds normal. No stridor. No respiratory distress. She has no wheezes.  Musculoskeletal: Normal range of motion. She exhibits no edema.  Neurological: She is alert and oriented to person, place, and time. No cranial  nerve deficit.  Skin: Skin is warm. She is not diaphoretic.  Psychiatric: She has a normal mood and affect.       IMAGING   CT chest 07/2016 With patient, RLL nodule, RUL nodular opacity Will need repeat CT chest in 3 months CT chest 02/2017 stable b/l pulm nodules  CT chest 10/02/2017 reviewed with patient The right upper lobe nodule opacity seems to have slightly increased in size All other nodules are stable  CT chest 1.21.19 Nodules stable since 2017 Will repeat CT chest in 1 year CTc hest 02/2017  ASSESSMENT/PLAN    67 yo white female with extensive exposure to second hand smoke with SOB with exertion with intermittent wheezing and  mild productive cough, her albuterol works well.  Findings c/w reactive airways disease also  with subcentimeter pulmonary  Nodules Patient also with excessive daytime sleepiness along with fatigue and tiredness dx of OSA  CT chest 3/19 reviewed: stable Nodules, no change over past 6 months CT chest 10/02/17 reviewed-right upper lobe nodular opacity has increased in size from 6 mm to 9 mm however will need to repeat CT chest in several months to assess interval changes CT chest 1.21.19 stable nodules since 2017-follow up in 1 year  1.RUL nodular vague  opacity -Slightly increased in size 10/02/17-01/13/18 Repeat CT chest in 6 months  LLL subcentimeter nodule-stable over 6 months RLL nodule approx  6MM has been present since 2009(from CT abd /pelvis)-stable over 6 months   2.reactive airways disease Stable, intermittent will stop LAMA/LABA-advised to use albuterol as needed prior to exertion   3.Continue OSA therapy with autoCPAP therapy Decrease pressure to 5-10 cm h20   Patient satisfied with Plan of action and management. All questions answered Follow up in 6 months   Claryce Friel Patricia Pesa, M.D.  [facility]  Medical Director [facility] Cardio-Pulmonary Department

## 2018-07-21 NOTE — Addendum Note (Signed)
Addended by: Stephanie Coup on: 07/21/2018 01:31 PM   Modules accepted: Orders

## 2018-07-21 NOTE — Patient Instructions (Signed)
Change AUTOCPAP pressure 5-10cm h20 Continue albuterol as needed  CT chest in 6 months

## 2018-07-24 ENCOUNTER — Encounter: Payer: Self-pay | Admitting: Internal Medicine

## 2018-07-28 ENCOUNTER — Encounter: Payer: Self-pay | Admitting: Gastroenterology

## 2018-09-04 ENCOUNTER — Ambulatory Visit: Payer: Medicare Other | Admitting: Internal Medicine

## 2018-09-23 ENCOUNTER — Ambulatory Visit (AMBULATORY_SURGERY_CENTER): Payer: Self-pay | Admitting: *Deleted

## 2018-09-23 VITALS — Ht 66.0 in | Wt 192.0 lb

## 2018-09-23 DIAGNOSIS — Z86010 Personal history of colonic polyps: Secondary | ICD-10-CM

## 2018-09-23 DIAGNOSIS — Z800 Family history of malignant neoplasm of digestive organs: Secondary | ICD-10-CM

## 2018-09-23 NOTE — Progress Notes (Signed)
No egg or soy allergy known to patient  No issues with past sedation with any surgeries  or procedures, no intubation problems  No diet pills per patient No home 02 use per patient  No blood thinners per patient  Pt denies issues with constipation  No A fib or A flutter  EMMI video sent to pt's e mail pt declined   

## 2018-10-06 ENCOUNTER — Encounter: Payer: Medicare Other | Admitting: Internal Medicine

## 2018-10-13 ENCOUNTER — Encounter: Payer: Self-pay | Admitting: Internal Medicine

## 2018-10-13 ENCOUNTER — Ambulatory Visit (AMBULATORY_SURGERY_CENTER): Payer: Medicare Other | Admitting: Internal Medicine

## 2018-10-13 VITALS — BP 143/74 | HR 61 | Temp 97.8°F | Resp 16 | Ht 66.0 in | Wt 192.0 lb

## 2018-10-13 DIAGNOSIS — Z1211 Encounter for screening for malignant neoplasm of colon: Secondary | ICD-10-CM | POA: Diagnosis not present

## 2018-10-13 DIAGNOSIS — D123 Benign neoplasm of transverse colon: Secondary | ICD-10-CM

## 2018-10-13 DIAGNOSIS — Z86010 Personal history of colonic polyps: Secondary | ICD-10-CM | POA: Diagnosis not present

## 2018-10-13 DIAGNOSIS — D125 Benign neoplasm of sigmoid colon: Secondary | ICD-10-CM

## 2018-10-13 MED ORDER — SODIUM CHLORIDE 0.9 % IV SOLN: 500.0000 mL | Freq: Once | INTRAVENOUS | Status: DC

## 2018-10-13 NOTE — Op Note (Signed)
[place] Patient Name: Claire Thomas Procedure Date: 10/13/2018 8:13 AM MRN: [id number] Endoscopist: Gatha Mayer , MD Age: 67 Referring MD:  Date of Birth: 01/20/1951 Gender: Female Account #: [id number] Procedure:                Colonoscopy Indications:              Surveillance: Personal history of adenomatous                            polyps on last colonoscopy > 5 years ago Medicines:                Propofol per Anesthesia, Monitored Anesthesia Care Procedure:                Pre-Anesthesia Assessment:                           - Prior to the procedure, a History and Physical                            was performed, and patient medications and                            allergies were reviewed. The patient's tolerance of                            previous anesthesia was also reviewed. The risks                            and benefits of the procedure and the sedation                            options and risks were discussed with the patient.                            All questions were answered, and informed consent                            was obtained. Prior Anticoagulants: The patient has                            taken no previous anticoagulant or antiplatelet                            agents. ASA Grade Assessment: II - A patient with                            mild systemic disease. After reviewing the risks                            and benefits, the patient was deemed in                            satisfactory condition to undergo the procedure.  After obtaining informed consent, the colonoscope                            was passed under direct vision. Throughout the                            procedure, the patient's blood pressure, pulse, and                            oxygen saturations were monitored continuously. The                            Colonoscope was introduced through the anus and   advanced to the the cecum, identified by                            appendiceal orifice and ileocecal valve. The                            colonoscopy was performed without difficulty. The                            patient tolerated the procedure well. The quality                            of the bowel preparation was adequate. The                            ileocecal valve, appendiceal orifice, and rectum                            were photographed. The bowel preparation used was                            MoviPrep. Scope In: 8:25:21 AM Scope Out: 8:41:48 AM Scope Withdrawal Time: 0 hours 13 minutes 0 seconds  Total Procedure Duration: 0 hours 16 minutes 27 seconds  Findings:                 The perianal and digital rectal examinations were                            normal.                           Two sessile polyps were found in the sigmoid colon                            and transverse colon. The polyps were diminutive in                            size. These polyps were removed with a cold snare.                            Resection and retrieval were complete. Verification  of patient identification for the specimen was                            done. Estimated blood loss was minimal.                           Diverticula were found in the left colon.                           Internal hemorrhoids were found.                           The exam was otherwise without abnormality on                            direct and retroflexion views. Complications:            No immediate complications. Estimated Blood Loss:     Estimated blood loss was minimal. Impression:               - Two diminutive polyps in the sigmoid colon and in                            the transverse colon, removed with a cold snare.                            Resected and retrieved.                           - Diverticulosis in the left colon.                           - Internal  hemorrhoids.                           - The examination was otherwise normal on direct                            and retroflexion views. Recommendation:           - Patient has a contact number available for                            emergencies. The signs and symptoms of potential                            delayed complications were discussed with the                            patient. Return to normal activities tomorrow.                            Written discharge instructions were provided to the                            patient.                           -  Resume previous diet.                           - Continue present medications.                           - Repeat colonoscopy is recommended for                            surveillance. The colonoscopy date will be                            determined after pathology results from today's                            exam become available for review.                           - SCHEDULE EGD AND DILATION AT [place], MD 10/13/2018 8:50:02 AM This report has been signed electronically.

## 2018-10-13 NOTE — Patient Instructions (Addendum)
I found and removed 2 tiny polyps. I will let you know pathology results and when to have another routine colonoscopy by mail and/or My Chart.  You also have diverticulosis and internal hemorrhoids. The hemorrhoids often swell with the prep - but if you have hemorrhoid problems (swelling, itching, bleeding) I am able to treat those with an in-office procedure. If you like, please call my office at [phone] to schedule an appointment and I can evaluate you further.  Given the persistent swallowing problems I recommend you have an upper endoscopy with dilation of the esophagus.  We will schedule that.  I appreciate the opportunity to care for you. Gatha Mayer, MD, [facility]  * handout on polyps, hemorrhoids and diverticulosis given. EGD and previsit scheduled   YOU HAD AN ENDOSCOPIC PROCEDURE TODAY AT [facility]:   Refer to the procedure report that was given to you for any specific questions about what was found during the examination.  If the procedure report does not answer your questions, please call your gastroenterologist to clarify.  If you requested that your care partner not be given the details of your procedure findings, then the procedure report has been included in a sealed envelope for you to review at your convenience later.  YOU SHOULD EXPECT: Some feelings of bloating in the abdomen. Passage of more gas than usual.  Walking can help get rid of the air that was put into your GI tract during the procedure and reduce the bloating. If you had a lower endoscopy (such as a colonoscopy or flexible sigmoidoscopy) you may notice spotting of blood in your stool or on the toilet paper. If you underwent a bowel prep for your procedure, you may not have a normal bowel movement for a few days.  Please Note:  You might notice some irritation and congestion in your nose or some drainage.  This is from the oxygen used during your procedure.  There is no need for concern  and it should clear up in a day or so.  SYMPTOMS TO REPORT IMMEDIATELY:   Following lower endoscopy (colonoscopy or flexible sigmoidoscopy):  Excessive amounts of blood in the stool  Significant tenderness or worsening of abdominal pains  Swelling of the abdomen that is new, acute  Fever of 100F or higher   For urgent or emergent issues, a gastroenterologist can be reached at any hour by calling [phone].   DIET:  We do recommend a small meal at first, but then you may proceed to your regular diet.  Drink plenty of fluids but you should avoid alcoholic beverages for 24 hours.  ACTIVITY:  You should plan to take it easy for the rest of today and you should NOT DRIVE or use heavy machinery until tomorrow (because of the sedation medicines used during the test).    FOLLOW UP: Our staff will call the number listed on your records the next business day following your procedure to check on you and address any questions or concerns that you may have regarding the information given to you following your procedure. If we do not reach you, we will leave a message.  However, if you are feeling well and you are not experiencing any problems, there is no need to return our call.  We will assume that you have returned to your regular daily activities without incident.  If any biopsies were taken you will be contacted by phone or by letter within the next 1-3 weeks.  Please call us at [phone] if you have not heard about the biopsies in 3 weeks.    SIGNATURES/CONFIDENTIALITY: You and/or your care partner have signed paperwork which will be entered into your electronic medical record.  These signatures attest to the fact that that the information above on your After Visit Summary has been reviewed and is understood.  Full responsibility of the confidentiality of this discharge information lies with you and/or your care-partner.

## 2018-10-13 NOTE — Progress Notes (Signed)
Pt's states no medical or surgical changes since previsit or office visit. 

## 2018-10-13 NOTE — Progress Notes (Signed)
Called to room to assist during endoscopic procedure.  Patient ID and intended procedure confirmed with present staff. Received instructions for my participation in the procedure from the performing physician.  

## 2018-10-13 NOTE — Progress Notes (Signed)
A/ox3 pleased with MAC, report to RN 

## 2018-10-14 ENCOUNTER — Telehealth: Payer: Self-pay

## 2018-10-14 NOTE — Telephone Encounter (Signed)
  Follow up Call-  Call back number 10/13/2018  Post procedure Call Back phone  # [phone]  Permission to leave phone message Yes  Some recent data might be hidden     Patient questions:  Do you have a fever, pain , or abdominal swelling? No. Pain Score  0 *  Have you tolerated food without any problems? Yes.    Have you been able to return to your normal activities? Yes.    Do you have any questions about your discharge instructions: Diet   No. Medications  No. Follow up visit  No.  Do you have questions or concerns about your Care? No.  Actions: * If pain score is 4 or above: No action needed, pain <4.

## 2018-10-16 DIAGNOSIS — M7062 Trochanteric bursitis, left hip: Secondary | ICD-10-CM | POA: Diagnosis not present

## 2018-10-21 ENCOUNTER — Ambulatory Visit (INDEPENDENT_AMBULATORY_CARE_PROVIDER_SITE_OTHER): Payer: Medicare Other

## 2018-10-21 ENCOUNTER — Encounter: Payer: Self-pay | Admitting: Internal Medicine

## 2018-10-21 ENCOUNTER — Ambulatory Visit (INDEPENDENT_AMBULATORY_CARE_PROVIDER_SITE_OTHER): Payer: Medicare Other | Admitting: Internal Medicine

## 2018-10-21 VITALS — BP 132/82 | HR 67 | Temp 98.5°F | Ht 65.0 in | Wt 191.4 lb

## 2018-10-21 DIAGNOSIS — I2510 Atherosclerotic heart disease of native coronary artery without angina pectoris: Secondary | ICD-10-CM

## 2018-10-21 DIAGNOSIS — Z1329 Encounter for screening for other suspected endocrine disorder: Secondary | ICD-10-CM | POA: Diagnosis not present

## 2018-10-21 DIAGNOSIS — M8580 Other specified disorders of bone density and structure, unspecified site: Secondary | ICD-10-CM | POA: Diagnosis not present

## 2018-10-21 DIAGNOSIS — R918 Other nonspecific abnormal finding of lung field: Secondary | ICD-10-CM | POA: Diagnosis not present

## 2018-10-21 DIAGNOSIS — Z13818 Encounter for screening for other digestive system disorders: Secondary | ICD-10-CM

## 2018-10-21 DIAGNOSIS — Z1231 Encounter for screening mammogram for malignant neoplasm of breast: Secondary | ICD-10-CM | POA: Diagnosis not present

## 2018-10-21 DIAGNOSIS — K580 Irritable bowel syndrome with diarrhea: Secondary | ICD-10-CM

## 2018-10-21 DIAGNOSIS — Z23 Encounter for immunization: Secondary | ICD-10-CM | POA: Diagnosis not present

## 2018-10-21 DIAGNOSIS — Z1159 Encounter for screening for other viral diseases: Secondary | ICD-10-CM | POA: Diagnosis not present

## 2018-10-21 DIAGNOSIS — M25552 Pain in left hip: Secondary | ICD-10-CM

## 2018-10-21 DIAGNOSIS — K219 Gastro-esophageal reflux disease without esophagitis: Secondary | ICD-10-CM

## 2018-10-21 DIAGNOSIS — Z1389 Encounter for screening for other disorder: Secondary | ICD-10-CM | POA: Diagnosis not present

## 2018-10-21 DIAGNOSIS — Z0184 Encounter for antibody response examination: Secondary | ICD-10-CM | POA: Diagnosis not present

## 2018-10-21 DIAGNOSIS — M5416 Radiculopathy, lumbar region: Secondary | ICD-10-CM

## 2018-10-21 DIAGNOSIS — E559 Vitamin D deficiency, unspecified: Secondary | ICD-10-CM

## 2018-10-21 DIAGNOSIS — E2839 Other primary ovarian failure: Secondary | ICD-10-CM | POA: Diagnosis not present

## 2018-10-21 DIAGNOSIS — J4520 Mild intermittent asthma, uncomplicated: Secondary | ICD-10-CM

## 2018-10-21 DIAGNOSIS — J432 Centrilobular emphysema: Secondary | ICD-10-CM

## 2018-10-21 HISTORY — DX: Atherosclerotic heart disease of native coronary artery without angina pectoris: I25.10

## 2018-10-21 LAB — VITAMIN D 25 HYDROXY (VIT D DEFICIENCY, FRACTURES): VITD: 24.86 ng/mL — ABNORMAL LOW (ref 30.00–100.00)

## 2018-10-21 LAB — TSH: TSH: 1.46 u[IU]/mL (ref 0.35–4.50)

## 2018-10-21 MED ORDER — TRAMADOL HCL 50 MG PO TABS: 50.0000 mg | ORAL_TABLET | Freq: Two times a day (BID) | ORAL | 0 refills | Status: DC | PRN

## 2018-10-21 NOTE — Patient Instructions (Addendum)
Vitamin D3 at least 1000 IU and calcium at least 600 mg 2x per day   Lumbosacral Radiculopathy Lumbosacral radiculopathy is a condition that involves the spinal nerves and nerve roots in the low back and bottom of the spine. The condition develops when these nerves and nerve roots move out of place or become inflamed and cause symptoms. What are the causes? This condition may be caused by:  Pressure from a disk that bulges out of place (herniated disk). A disk is a plate of cartilage that separates bones in the spine.  Disk degeneration.  A narrowing of the bones of the lower back (spinal stenosis).  A tumor.  An infection.  An injury that places sudden pressure on the disks that cushion the bones of your lower spine.  What increases the risk? This condition is more likely to develop in:  Males aged 30-50 years.  Females aged 7-60 years.  People who lift improperly.  People who are overweight or live a sedentary lifestyle.  People who smoke.  People who perform repetitive activities that strain the spine.  What are the signs or symptoms? Symptoms of this condition include:  Pain that goes down from the back into the legs (sciatica). This is the most common symptom. The pain may be worse with sitting, coughing, or sneezing.  Pain and numbness in the arms and legs.  Muscle weakness.  Tingling.  Loss of bladder control or bowel control.  How is this diagnosed? This condition is diagnosed with a physical exam and medical history. If the pain is lasting, you may have tests, such as:  MRI scan.  X-ray.  CT scan.  Myelogram.  Nerve conduction study.  How is this treated? This condition is often treated with:  Hot packs and ice applied to affected areas.  Stretches to improve flexibility.  Exercises to strengthen back muscles.  Physical therapy.  Pain medicine.  A steroid injection in the spine.  In some cases, no treatment is needed. If the  condition is long-lasting (chronic), or if symptoms are severe, treatment may involve surgery or lifestyle changes, such as following a weight loss plan. Follow these instructions at home: Medicines  Take medicines only as directed by your health care provider.  Do not drive or operate heavy machinery while taking pain medicine. Injury care  Apply a heat pack to the injured area as directed by your health care provider.  Apply ice to the affected area: ? Put ice in a plastic bag. ? Place a towel between your skin and the bag. ? Leave the ice on for 20-30 minutes, every 2 hours while you are awake or as needed. Or, leave the ice on for as long as directed by your health care provider. Other Instructions  If you were shown how to do any exercises or stretches, do them as directed by your health care provider.  If your health care provider prescribed a diet or exercise program, follow it as directed.  Keep all follow-up visits as directed by your health care provider. This is important. Contact a health care provider if:  Your pain does not improve over time even when taking pain medicines. Get help right away if:  Your develop severe pain.  Your pain suddenly gets worse.  You develop increasing weakness in your legs.  You lose the ability to control your bladder or bowel.  You have difficulty walking or balancing.  You have a fever. This information is not intended to replace advice given  to you by your health care provider. Make sure you discuss any questions you have with your health care provider. Document Released: 12/10/2005 Document Revised: 05/17/2016 Document Reviewed: 12/06/2014 Elsevier Interactive Patient Education  2018 [employer].   Back Pain, Adult Many adults have back pain from time to time. Common causes of back pain include:  A strained muscle or ligament.  Wear and tear (degeneration) of the spinal disks.  Arthritis.  A hit to the back.  Back  pain can be short-lived (acute) or last a long time (chronic). A physical exam, lab tests, and imaging studies may be done to find the cause of your pain. Follow these instructions at home: Managing pain and stiffness  Take over-the-counter and prescription medicines only as told by your health care provider.  If directed, apply heat to the affected area as often as told by your health care provider. Use the heat source that your health care provider recommends, such as a moist heat pack or a heating pad. ? Place a towel between your skin and the heat source. ? Leave the heat on for 20-30 minutes. ? Remove the heat if your skin turns bright red. This is especially important if you are unable to feel pain, heat, or cold. You have a greater risk of getting burned.  If directed, apply ice to the injured area: ? Put ice in a plastic bag. ? Place a towel between your skin and the bag. ? Leave the ice on for 20 minutes, 2-3 times a day for the first 2-3 days. Activity  Do not stay in bed. Resting more than 1-2 days can delay your recovery.  Take short walks on even surfaces as soon as you are able. Try to increase the length of time you walk each day.  Do not sit, drive, or stand in one place for more than 30 minutes at a time. Sitting or standing for long periods of time can put stress on your back.  Use proper lifting techniques. When you bend and lift, use positions that put less stress on your back: ? [place] your knees. ? Keep the load close to your body. ? Avoid twisting.  Exercise regularly as told by your health care provider. Exercising will help your back heal faster. This also helps prevent back injuries by keeping muscles strong and flexible.  Your health care provider may recommend that you see a physical therapist. This person can help you come up with a safe exercise program. Do any exercises as told by your physical therapist. Lifestyle  Maintain a healthy weight. Extra weight  puts stress on your back and makes it difficult to have good posture.  Avoid activities or situations that make you feel anxious or stressed. Learn ways to manage anxiety and stress. One way to manage stress is through exercise. Stress and anxiety increase muscle tension and can make back pain worse. General instructions  Sleep on a firm mattress in a comfortable position. Try lying on your side with your knees slightly bent. If you lie on your back, put a pillow under your knees.  Follow your treatment plan as told by your health care provider. This may include: ? Cognitive or behavioral therapy. ? Acupuncture or massage therapy. ? Meditation or yoga. Contact a health care provider if:  You have pain that is not relieved with rest or medicine.  You have increasing pain going down into your legs or buttocks.  Your pain does not improve in 2 weeks.  You have pain at night.  You lose weight.  You have a fever or chills. Get help right away if:  You develop new bowel or bladder control problems.  You have unusual weakness or numbness in your arms or legs.  You develop nausea or vomiting.  You develop abdominal pain.  You feel faint. Summary  Many adults have back pain from time to time. A physical exam, lab tests, and imaging studies may be done to find the cause of your pain.  Use proper lifting techniques. When you bend and lift, use positions that put less stress on your back.  Take over-the-counter and prescription medicines and apply heat or ice as directed by your health care provider. This information is not intended to replace advice given to you by your health care provider. Make sure you discuss any questions you have with your health care provider. Document Released: 12/10/2005 Document Revised: 01/14/2017 Document Reviewed: 01/14/2017 Elsevier Interactive Patient Education  [employer].

## 2018-10-21 NOTE — Progress Notes (Signed)
Pre visit review using our clinic review tool, if applicable. No additional management support is needed unless otherwise documented below in the visit note. 

## 2018-10-22 ENCOUNTER — Encounter: Payer: Self-pay | Admitting: Internal Medicine

## 2018-10-22 DIAGNOSIS — J432 Centrilobular emphysema: Secondary | ICD-10-CM | POA: Insufficient documentation

## 2018-10-22 DIAGNOSIS — M25552 Pain in left hip: Secondary | ICD-10-CM

## 2018-10-22 DIAGNOSIS — J45909 Unspecified asthma, uncomplicated: Secondary | ICD-10-CM | POA: Insufficient documentation

## 2018-10-22 HISTORY — DX: Pain in left hip: M25.552

## 2018-10-22 LAB — URINALYSIS, ROUTINE W REFLEX MICROSCOPIC
Bacteria, UA: NONE SEEN /HPF
Bilirubin Urine: NEGATIVE
Glucose, UA: NEGATIVE
Hgb urine dipstick: NEGATIVE
Hyaline Cast: NONE SEEN /LPF
Ketones, ur: NEGATIVE
Nitrite: NEGATIVE
Protein, ur: NEGATIVE
RBC / HPF: NONE SEEN /HPF (ref 0–2)
Specific Gravity, Urine: 1.018 (ref 1.001–1.03)
pH: 5.5 (ref 5.0–8.0)

## 2018-10-22 LAB — MEASLES/MUMPS/RUBELLA IMMUNITY
Mumps IgG: 101 AU/mL
Rubella: 3.29 index
Rubeola IgG: >300 AU/mL

## 2018-10-22 NOTE — Progress Notes (Addendum)
Chief Complaint  Patient presents with  . Establish Care   1. Asthma and centrilobar emphysema noted on CT chest 01/13/18, h/o lung nodules f/u with Dr. Mortimer Fries prn Albuterol inhaler  2. GERD controlled on protonix  3. C/o left hip pain worse x 6-7 weeks radiating to left groin feels like a pull in left groin and hurting esp with sitting/walking and lying down. She went to chiropractor 4x and had Xray left hip and steroid injection at [facility] recently w/o relief of sx's.  -seen [facility] with steroid injection x 1 and Xray and seen Dr. Alfred Levins chiropractor 4x  4. IBS-D takes bentyl prn   Review of Systems  Constitutional: Negative for weight loss.  HENT: Negative for hearing loss.   Eyes: Negative for blurred vision.  Respiratory: Negative for shortness of breath.   Cardiovascular: Negative for chest pain.  Gastrointestinal: Negative for abdominal pain.  Musculoskeletal: Positive for joint pain.  Skin: Negative for rash.  Neurological: Negative for headaches.  Psychiatric/Behavioral: Negative for depression.   Past Medical History:  Diagnosis Date  . Arthritis   . Asthma   . CAD (coronary artery disease) 10/21/2018  . GERD (gastroesophageal reflux disease)   . IBS (irritable bowel syndrome)    diarrhea type  . Sleep apnea    wears cpap but no 02    Past Surgical History:  Procedure Laterality Date  . CARDIAC CATHETERIZATION  07/19/2014   [facility]  . CARPAL TUNNEL RELEASE Bilateral   . COLONOSCOPY    . ESOPHAGOGASTRODUODENOSCOPY    . MENISCUS REPAIR Left   . PARTIAL HYSTERECTOMY    . UPPER GASTROINTESTINAL ENDOSCOPY     Family History  Problem Relation Age of Onset  . Colon cancer Mother   . Hyperlipidemia Mother   . Hypertension Mother   . Pulmonary fibrosis Mother   . AAA (abdominal aortic aneurysm) Father   . Hyperlipidemia Father   . Hypertension Father   . Prostate cancer Father   . Diabetes Paternal Grandmother   . Esophageal cancer Neg Hx   .  Stomach cancer Neg Hx   . Rectal cancer Neg Hx   . Colon polyps Neg Hx    Social History   Socioeconomic History  . Marital status: Married    Spouse name: Not on file  . Number of children: 2  . Years of education: Not on file  . Highest education level: Not on file  Occupational History  . Occupation: Glass blower/designer  Social Needs  . Financial resource strain: Not on file  . Food insecurity:    Worry: Not on file    Inability: Not on file  . Transportation needs:    Medical: Not on file    Non-medical: Not on file  Tobacco Use  . Smoking status: Never Smoker  . Smokeless tobacco: Never Used  Substance and Sexual Activity  . Alcohol use: Yes    Alcohol/week: 0.0 standard drinks    Comment: occassioinal-wine every 6 months  . Drug use: No  . Sexual activity: Not on file  Lifestyle  . Physical activity:    Days per week: Not on file    Minutes per session: Not on file  . Stress: Not on file  Relationships  . Social connections:    Talks on phone: Not on file    Gets together: Not on file    Attends religious service: Not on file    Active member of club or organization: Not on file  Attends meetings of clubs or organizations: Not on file    Relationship status: Not on file  . Intimate partner violence:    Fear of current or ex partner: Not on file    Emotionally abused: Not on file    Physically abused: Not on file    Forced sexual activity: Not on file  Other Topics Concern  . Not on file  Social History Narrative  . Not on file   Current Meds  Medication Sig  . albuterol (PROVENTIL HFA;VENTOLIN HFA) 108 (90 Base) MCG/ACT inhaler Inhale 2 puffs into the lungs every 4 (four) hours as needed for wheezing or shortness of breath.  . Calcium-Vitamin D 600-200 MG-UNIT per tablet Take 1 tablet by mouth 2 (two) times daily.    [place] dicyclomine (BENTYL) 20 MG tablet TAKE 1/2 TO 1 TABLET BY MOUTH 30 MINUTES BEFORE MEALS AS NEEDED  . pantoprazole (PROTONIX) 40 MG tablet  Take 1 tablet (40 mg total) by mouth daily.   No Known Allergies Recent Results (from the past 2160 hour(s))  Urinalysis, Routine w reflex microscopic     Status: Abnormal   Collection Time: 10/21/18  2:14 PM  Result Value Ref Range   Color, Urine YELLOW YELLOW   APPearance CLEAR CLEAR   Specific Gravity, Urine 1.018 1.001 - 1.03   pH 5.5 5.0 - 8.0   Glucose, UA NEGATIVE NEGATIVE   Bilirubin Urine NEGATIVE NEGATIVE   Ketones, ur NEGATIVE NEGATIVE   Hgb urine dipstick NEGATIVE NEGATIVE   Protein, ur NEGATIVE NEGATIVE   Nitrite NEGATIVE NEGATIVE   Leukocytes, UA 2+ (A) NEGATIVE   WBC, UA 20-40 (A) 0 - 5 /HPF   RBC / HPF NONE SEEN 0 - 2 /HPF   Squamous Epithelial / LPF 0-5 < OR = 5 /HPF   Bacteria, UA NONE SEEN NONE SEEN /HPF   Hyaline Cast NONE SEEN NONE SEEN /LPF  TSH     Status: None   Collection Time: 10/21/18  2:14 PM  Result Value Ref Range   TSH 1.46 0.35 - 4.50 uIU/mL  Vitamin D (25 hydroxy)     Status: Abnormal   Collection Time: 10/21/18  2:14 PM  Result Value Ref Range   VITD 24.86 (L) 30.00 - 100.00 ng/mL   Objective  Body mass index is 31.85 kg/m. Wt Readings from Last 3 Encounters:  10/21/18 191 lb 6.4 oz (86.8 kg)  10/13/18 192 lb (87.1 kg)  09/23/18 192 lb (87.1 kg)   Temp Readings from Last 3 Encounters:  10/21/18 98.5 F (36.9 C) (Oral)  10/13/18 97.8 F (36.6 C)  07/19/15 (!) 96.8 F (36 C)   BP Readings from Last 3 Encounters:  10/21/18 132/82  10/13/18 (!) 143/74  07/21/18 (!) 148/82   Pulse Readings from Last 3 Encounters:  10/21/18 67  10/13/18 61  07/21/18 74    Physical Exam  Constitutional: She is oriented to person, place, and time. Vital signs are normal. She appears well-developed and well-nourished. She is cooperative.  HENT:  Head: Normocephalic and atraumatic.  Mouth/Throat: Oropharynx is clear and moist and mucous membranes are normal.  Eyes: Pupils are equal, round, and reactive to light. Conjunctivae are normal.   Cardiovascular: Normal rate, regular rhythm and normal heart sounds.  Pulmonary/Chest: Effort normal and breath sounds normal.  Neurological: She is alert and oriented to person, place, and time. Gait normal.  Skin: Skin is warm, dry and intact.  Psychiatric: She has a normal mood and affect. Her speech  is normal and behavior is normal. Judgment and thought content normal. Cognition and memory are normal.  Nursing note and vitals reviewed.   Assessment   1. Asthma and centrilobar emphysema noted on CT chest 01/13/18, h/o lung nodules  2. GERD controlled  3. C/o left hip pain worse x 6-7 weeks radiating to left groin -seen [facility] with steroid injection x 1 and Xray and seen Dr. Alfred Levins chiropractor 4x r/o low back pain  4. IBS-D  5. HM  Plan   1. F/u Dr. Mortimer Fries, prn Albuterol inhaler  2. protonix cont 3. Xray low back today  Get Xray [facility] and records today saw 10/21/18 though trochanteric bursitis left hip Xray neg fracture jts spaces preserved injection givne lido and bupicacaine and kenalog x 1   Prn Tramadol 4. Cont meds bentyl prn prn immodium  5.  Reviewed labs had 05/07/18 CBC, CMET, lipid  check UA, TSH, vit D and MMR today   Flu shot given today  rec Tdap disc at pharmacy to get Tdap had 03/09/10  Had zostervax  pna vaccines disc with pt at f/u   Pap  LMP 1976 hysterectomy ovaries intact no h/o abnormal pap last pap 08/21/12 neg pap neg HPV Mammogram 02/21/18 neg referred [place] due 02/22/2019 Colonoscopy 09/2018 tubular and hyperplastic polyps 2 total polyps Dr. Carlean Purl  DEXA h/o osteopenia 2012 referred repeat  HCV neg 01/26/16   GI Dr. Carlean Purl FH colon cancer mom died age 59 and h/o polyps  Lung Dr. Maryln Manuel [facility]  Dentist Dr. Norman Herrlich   Former PCP [place]    Provider: Dr. Olivia Mackie McLean-Scocuzza[facility]

## 2018-10-26 ENCOUNTER — Encounter: Payer: Self-pay | Admitting: Internal Medicine

## 2018-11-24 ENCOUNTER — Encounter: Payer: Medicare Other | Admitting: Internal Medicine

## 2018-12-09 ENCOUNTER — Ambulatory Visit: Payer: Medicare Other | Admitting: Internal Medicine

## 2019-01-06 ENCOUNTER — Ambulatory Visit (INDEPENDENT_AMBULATORY_CARE_PROVIDER_SITE_OTHER): Payer: Medicare Other | Admitting: Internal Medicine

## 2019-01-06 ENCOUNTER — Encounter: Payer: Self-pay | Admitting: Internal Medicine

## 2019-01-06 VITALS — BP 124/82 | HR 63 | Temp 97.5°F | Ht 65.0 in | Wt 193.4 lb

## 2019-01-06 DIAGNOSIS — Z0000 Encounter for general adult medical examination without abnormal findings: Secondary | ICD-10-CM

## 2019-01-06 DIAGNOSIS — I2510 Atherosclerotic heart disease of native coronary artery without angina pectoris: Secondary | ICD-10-CM

## 2019-01-06 DIAGNOSIS — Z13220 Encounter for screening for lipoid disorders: Secondary | ICD-10-CM

## 2019-01-06 DIAGNOSIS — M8580 Other specified disorders of bone density and structure, unspecified site: Secondary | ICD-10-CM

## 2019-01-06 DIAGNOSIS — F419 Anxiety disorder, unspecified: Secondary | ICD-10-CM | POA: Diagnosis not present

## 2019-01-06 DIAGNOSIS — Z23 Encounter for immunization: Secondary | ICD-10-CM | POA: Diagnosis not present

## 2019-01-06 DIAGNOSIS — F439 Reaction to severe stress, unspecified: Secondary | ICD-10-CM

## 2019-01-06 DIAGNOSIS — Z1389 Encounter for screening for other disorder: Secondary | ICD-10-CM | POA: Diagnosis not present

## 2019-01-06 DIAGNOSIS — E669 Obesity, unspecified: Secondary | ICD-10-CM | POA: Diagnosis not present

## 2019-01-06 DIAGNOSIS — J4520 Mild intermittent asthma, uncomplicated: Secondary | ICD-10-CM | POA: Diagnosis not present

## 2019-01-06 DIAGNOSIS — K580 Irritable bowel syndrome with diarrhea: Secondary | ICD-10-CM

## 2019-01-06 DIAGNOSIS — M47816 Spondylosis without myelopathy or radiculopathy, lumbar region: Secondary | ICD-10-CM | POA: Insufficient documentation

## 2019-01-06 DIAGNOSIS — Z1329 Encounter for screening for other suspected endocrine disorder: Secondary | ICD-10-CM

## 2019-01-06 HISTORY — DX: Spondylosis without myelopathy or radiculopathy, lumbar region: M47.816

## 2019-01-06 NOTE — Progress Notes (Signed)
Chief Complaint  Patient presents with  . Follow-up   F/u  1. Reviewed Xray low back with scoliosis and deg changes she feels like she has to step with right foot 1st going up stairs and cant with left f/u with [facility] last seen 10/21/18  2. Asthma controlled f/u with Dr. Mortimer Fries next week CT upcoming 01/21/2019 and pulm f/u 01/26/2019  3. Stress at work causing h/a, eyes twitching, IBS and epigastric pain. Stress increased x 2 months. 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 OSA on cpap qhs x 1 year.  5. H/o CAD noted on CT chest 2017 and 2019 in LAD c/o intermittent epigastric pain with stress GERD controlled on protonix 40 mg qd will refer for cards risk assessment Dr. Rockey Situ pt prefers    Review of Systems  Constitutional: Negative for weight loss.  HENT: Negative for hearing loss.   Eyes: Negative for blurred vision.  Respiratory: Negative for shortness of breath.   Cardiovascular: Negative for chest pain.  Gastrointestinal: Positive for abdominal pain.  Musculoskeletal: Negative for back pain and falls.  Skin: Negative for rash.  Neurological: Positive for headaches.       +eyes twitching    Psychiatric/Behavioral: The patient is nervous/anxious.        +stress     Past Medical History:  Diagnosis Date  . Arthritis   . Asthma    worse with exertion   . CAD (coronary artery disease) 10/21/2018  . Chicken pox   . Colon polyps   . Frequent headaches   . GERD (gastroesophageal reflux disease)   . IBS (irritable bowel syndrome)    diarrhea type  . Osteopenia   . Sleep apnea    wears cpap but no 02    Past Surgical History:  Procedure Laterality Date  . ABDOMINAL HYSTERECTOMY     1976 DUB, pelvic pain ovaries intact   . CARDIAC CATHETERIZATION  07/19/2014   [facility]  . CARPAL TUNNEL RELEASE Bilateral   . COLONOSCOPY    . ESOPHAGOGASTRODUODENOSCOPY    . MENISCUS REPAIR Left   . PARTIAL HYSTERECTOMY    .  UPPER GASTROINTESTINAL ENDOSCOPY     Family History  Problem Relation Age of Onset  . Colon cancer Mother   . Hyperlipidemia Mother   . Pulmonary fibrosis Mother   . Arthritis Mother   . Cancer Mother        colon   . AAA (abdominal aortic aneurysm) Father   . Hyperlipidemia Father   . Hypertension Father   . Prostate cancer Father   . Cancer Father        prostate   . Dementia Father   . Diabetes Paternal Grandmother   . Hyperlipidemia Brother   . Hypertension Brother   . Arthritis Son   . Heart disease Maternal Grandfather   . Asthma Paternal Grandfather   . Esophageal cancer Neg Hx   . Stomach cancer Neg Hx   . Rectal cancer Neg Hx   . Colon polyps Neg Hx    Social History   Socioeconomic History  . Marital status: Married    Spouse name: Not on file  . Number of children: 2  . Years of education: Not on file  . Highest education level: Not on file  Occupational History  . Occupation: Glass blower/designer  Social Needs  . Financial resource strain: Not on file  .  Food insecurity:    Worry: Not on file    Inability: Not on file  . Transportation needs:    Medical: Not on file    Non-medical: Not on file  Tobacco Use  . Smoking status: Never Smoker  . Smokeless tobacco: Never Used  Substance and Sexual Activity  . Alcohol use: Yes    Alcohol/week: 0.0 standard drinks    Comment: occassioinal-wine every 6 months  . Drug use: No  . Sexual activity: Yes    Comment: men  Lifestyle  . Physical activity:    Days per week: Not on file    Minutes per session: Not on file  . Stress: Not on file  Relationships  . Social connections:    Talks on phone: Not on file    Gets together: Not on file    Attends religious service: Not on file    Active member of club or organization: Not on file    Attends meetings of clubs or organizations: Not on file    Relationship status: Not on file  . Intimate partner violence:    Fear of current or ex partner: Not on file     Emotionally abused: Not on file    Physically abused: Not on file    Forced sexual activity: Not on file  Other Topics Concern  . 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 albuterol (PROVENTIL HFA;VENTOLIN HFA) 108 (90 Base) MCG/ACT inhaler Inhale 2 puffs into the lungs every 4 (four) hours as needed for wheezing or shortness of breath.  . Calcium-Vitamin D 600-200 MG-UNIT per tablet Take 1 tablet by mouth 2 (two) times daily.    . Cholecalciferol 125 MCG (5000 UT) TABS Take by mouth.  . dicyclomine (BENTYL) 20 MG tablet TAKE 1/2 TO 1 TABLET BY MOUTH 30 MINUTES BEFORE MEALS AS NEEDED  . pantoprazole (PROTONIX) 40 MG tablet Take 1 tablet (40 mg total) by mouth daily.   No Known Allergies Recent Results (from the past 2160 hour(s))  Urinalysis, Routine w reflex microscopic     Status: Abnormal   Collection Time: 10/21/18  2:14 PM  Result Value Ref Range   Color, Urine YELLOW YELLOW   APPearance CLEAR CLEAR   Specific Gravity, Urine 1.018 1.001 - 1.03   pH 5.5 5.0 - 8.0   Glucose, UA NEGATIVE NEGATIVE   Bilirubin Urine NEGATIVE NEGATIVE   Ketones, ur NEGATIVE NEGATIVE   Hgb urine dipstick NEGATIVE NEGATIVE   Protein, ur NEGATIVE NEGATIVE   Nitrite NEGATIVE NEGATIVE   Leukocytes, UA 2+ (A) NEGATIVE   WBC, UA 20-40 (A) 0 - 5 /HPF   RBC / HPF NONE SEEN 0 - 2 /HPF   Squamous Epithelial / LPF 0-5 < OR = 5 /HPF   Bacteria, UA NONE SEEN NONE SEEN /HPF   Hyaline Cast NONE SEEN NONE SEEN /LPF  TSH     Status: None   Collection Time: 10/21/18  2:14 PM  Result Value Ref Range   TSH 1.46 0.35 - 4.50 uIU/mL  Measles/Mumps/Rubella Immunity     Status: None   Collection Time: 10/21/18  2:14 PM  Result Value Ref Range   Rubeola IgG >300.00 AU/mL    Comment: AU/mL  Interpretation -----            -------------- <25.00            Negative 25.00-29.99      Equivocal >29.99           Positive . A positive result indicates that the patient has antibody to measles virus. It does not differentiate  between an active or past infection. The clinical  diagnosis must be interpreted in conjunction with  clinical signs and symptoms of the patient.    Mumps IgG 101.00 AU/mL    Comment:  AU/mL           Interpretation -------         ---------------- <9.00             Negative 9.00-10.99        Equivocal >10.99            Positive A positive result indicates that the patient has  antibody to mumps virus. It does not differentiate between an  active or past infection. The clinical diagnosis must be interpreted in conjunction with clinical signs and symptoms of the patient. .    Rubella 3.29 index    Comment:     Index            Interpretation     -----            --------------       <0.90            Not consistent with Immunity     0.90-0.99        Equivocal     > or = 1.00      Consistent with Immunity  . The presence of rubella IgG antibody suggests  immunization or past or current infection with rubella virus.   Vitamin D (25 hydroxy)     Status: Abnormal   Collection Time: 10/21/18  2:14 PM  Result Value Ref Range   VITD 24.86 (L) 30.00 - 100.00 ng/mL   Objective  Body mass index is 32.18 kg/m. Wt Readings from Last 3 Encounters:  01/06/19 193 lb 6.4 oz (87.7 kg)  10/21/18 191 lb 6.4 oz (86.8 kg)  10/13/18 192 lb (87.1 kg)   Temp Readings from Last 3 Encounters:  01/06/19 (!) 97.5 F (36.4 C) (Oral)  10/21/18 98.5 F (36.9 C) (Oral)  10/13/18 97.8 F (36.6 C)   BP Readings from Last 3 Encounters:  01/06/19 124/82  10/21/18 132/82  10/13/18 (!) 143/74   Pulse Readings from Last 3 Encounters:  01/06/19 63  10/21/18 67  10/13/18 61    Physical Exam Vitals signs and nursing note reviewed.  Constitutional:      Appearance: Normal appearance. She is well-developed. She is obese.  HENT:      Head: Normocephalic and atraumatic.     Nose: Nose normal.     Mouth/Throat:     Mouth: Mucous membranes are moist.     Pharynx: Oropharynx is clear.  Eyes:     Conjunctiva/sclera: Conjunctivae normal.     Pupils: Pupils are equal, round, and reactive to light.  Cardiovascular:     Rate and Rhythm: Normal rate and regular rhythm.     Heart sounds: Normal heart sounds.  Pulmonary:     Effort: Pulmonary effort is normal.     Breath sounds: Normal breath sounds.  Skin:    General: Skin is warm and dry.  Neurological:     General: No  focal deficit present.     Mental Status: She is alert and oriented to person, place, and time.     Gait: Gait normal.  Psychiatric:        Attention and Perception: Attention and perception normal.        Mood and Affect: Mood and affect normal.        Speech: Speech normal.        Behavior: Behavior normal. Behavior is cooperative.        Thought Content: Thought content normal.        Cognition and Memory: Cognition and memory normal.        Judgment: Judgment normal.     Assessment   1. Scoliosis and deg changes/OA lumbar DJD 2. Asthma controlled, osa on cpap  3. Stress/anxiety/insomnia  4. CAD with epigastric pain r/o cardiac etiology  5. IBS  6. HM Plan   1. F/u [facility] prn  2. CT upcoming and f/u with Dr. Mortimer Fries  Prn albuterol  3. Monitor for now  4. Refer to Dr. Rockey Situ consider stress test with CT + LAD CAD  5. Prn Immodium  6.  Flu shot utd  rec Tdap disc at pharmacy to get Tdap had 03/09/10  Had zostervax, shingrix Rx today  prevnar given today, pna 23 in 1 week  MMR immune   Pap  LMP 1976 hysterectomy ovaries intact no h/o abnormal pap last pap 08/21/12 neg pap neg HPV Mammogram 02/21/18 neg referred [place] due 02/22/2019 pt to call to schedule  Colonoscopy 09/2018 tubular and hyperplastic polyps 2 total polyps Dr. Carlean Purl repeat in 5 years  DEXA h/o osteopenia 2012 referred repeat ordered  HCV neg 01/26/16   GI Dr.  Carlean Purl FH colon cancer mom died age 65 and h/o polyps  Lung Dr. Maryln Manuel [place] [facility]  Dentist Dr. Norman Herrlich  fasting labs summer 2020   Former PCP [place]  Provider: Dr. Olivia Mackie [facility]

## 2019-01-06 NOTE — Progress Notes (Signed)
Pre visit review using our clinic review tool, if applicable. No additional management support is needed unless otherwise documented below in the visit note. 

## 2019-01-06 NOTE — Patient Instructions (Addendum)
Dr. Rockey Situ -cardiologist/heart doctor [place] [id number]    Can try immodium for IBS -diarrhea  Yogurt with probiotics or Align probiotics  Bananas prebiotics   Prevnar then 1 year later pna 23  Shingrix x 2 doses  Recombinant Zoster (Shingles) Vaccine, RZV: What You Need to Know 1. Why get vaccinated? Shingles (also called herpes zoster, or just zoster) is a painful skin rash, often with blisters. Shingles is caused by the varicella zoster virus, the same virus that causes chickenpox. After you have chickenpox, the virus stays in your body and can cause shingles later in life. You can't catch shingles from another person. However, a person who has never had chickenpox (or chickenpox vaccine) could get chickenpox from someone with shingles. A shingles rash usually appears on one side of the face or body and heals within 2 to 4 weeks. Its main symptom is pain, which can be severe. Other symptoms can include fever, headache, chills, and upset stomach. Very rarely, a shingles infection can lead to pneumonia, hearing problems, blindness, brain inflammation (encephalitis), or death. For about 1 person in 5, severe pain can continue even long after the rash has cleared up. This long-lasting pain is called post-herpetic neuralgia (PHN). Shingles is far more common in people 68 years of age and older than in younger people, and the risk increases with age. It is also more common in people whose immune system is weakened because of a disease such as cancer, or by drugs such as steroids or chemotherapy. At least 1 million people a year in the [place] get shingles. 2. Shingles vaccine (recombinant) Recombinant shingles vaccine was approved by FDA in 2017 for the prevention of shingles. In clinical trials, it was more than 90% effective in preventing shingles. It can also reduce the likelihood of PHN. Two doses, 2 to 6 months apart, are recommended  for adults 68 and older. This vaccine is also recommended for people who have already gotten the live shingles vaccine (Zostavax). There is no live virus in this vaccine. 3. Some people should not get this vaccine Tell your vaccine provider if you:  Have any severe, life-threatening allergies. A person who has ever had a life-threatening allergic reaction after a dose of recombinant shingles vaccine, or has a severe allergy to any component of this vaccine, may be advised not to be vaccinated. Ask your health care provider if you want information about vaccine components.  Are pregnant or breastfeeding. There is not much information about use of recombinant shingles vaccine in pregnant or nursing women. Your healthcare provider might recommend delaying vaccination.  Are not feeling well. If you have a mild illness, such as a cold, you can probably get the vaccine today. If you are moderately or severely ill, you should probably wait until you recover. Your doctor can advise you. 4. Risks of a vaccine reaction With any medicine, including vaccines, there is a chance of reactions. After recombinant shingles vaccination, a person might experience:  Pain, redness, soreness, or swelling at the site of the injection  Headache, muscle aches, fever, shivering, fatigue In clinical trials, most people got a sore arm with mild or moderate pain after vaccination, and some also had redness and swelling where they got the shot. Some people felt tired, had muscle pain, a headache, shivering, fever, stomach pain, or nausea. About 1 out of 6 people who got recombinant zoster vaccine  experienced side effects that prevented them from doing regular activities. Symptoms went away on their own in about 2 to 3 days. Side effects were more common in younger people. You should still get the second dose of recombinant zoster vaccine even if you had one of these reactions after the first dose. Other things that could happen  after this vaccine:  People sometimes faint after medical procedures, including vaccination. Sitting or lying down for about 15 minutes can help prevent fainting and injuries caused by a fall. Tell your provider if you feel dizzy or have vision changes or ringing in the ears.  Some people get shoulder pain that can be more severe and longer-lasting than routine soreness that can follow injections. This happens very rarely.  Any medication can cause a severe allergic reaction. Such reactions to a vaccine are estimated at about 1 in a million doses, and would happen within a few minutes to a few hours after the vaccination. As with any medicine, there is a very remote chance of a vaccine causing a serious injury or death. The safety of vaccines is always being monitored. For more information, visit: http://www.aguilar.org/ 5. What if there is a serious problem? What should I look for?  Look for anything that concerns you, such as signs of a severe allergic reaction, very high fever, or unusual behavior. Signs of a severe allergic reaction can include hives, swelling of the face and throat, difficulty breathing, a fast heartbeat, dizziness, and weakness. These would usually start a few minutes to a few hours after the vaccination. What should I do?  If you think it is a severe allergic reaction or other emergency that can't wait, call 9-1-1 or get to the nearest hospital. Otherwise, call your health care provider. Afterward, the reaction should be reported to the Vaccine Adverse Event Reporting System (VAERS). Your doctor should file this report, or you can do it yourself through the VAERS website at www.vaers.Claire Thomas, or by calling (269) 129-7567. VAERS does not give medical advice. 6. How can I learn more?  Ask your health care provider. He or she can give you the vaccine package insert or suggest other sources of information.  Call your local or state health department.  Contact the  Centers for Disease Control and Prevention (CDC): ? Call 5142362887 (1-800-CDC-INFO) or ? Visit CDC's vaccines website at http://hunter.com/ CDC Vaccine Information Statement Recombinant Zoster Vaccine (02/04/2017) This information is not intended to replace advice given to you by your health care provider. Make sure you discuss any questions you have with your health care provider. Document Released: 02/19/2017 Document Revised: 07/16/2018 Document Reviewed: 07/16/2018 Elsevier Interactive Patient Education  2019 [place].   Diet for Irritable Bowel Syndrome When you have irritable bowel syndrome (IBS), it is very important to eat the foods and follow the eating habits that are best for your condition. IBS may cause various symptoms such as pain in the abdomen, constipation, or diarrhea. Choosing the right foods can help to ease the discomfort from these symptoms. Work with your health care provider and diet and nutrition specialist (dietitian) to find the eating plan that will help to control your symptoms. What are tips for following this plan?      Keep a food diary. This will help you identify foods that cause symptoms. Write down: ? What you eat and when you eat it. ? What symptoms you have. ? When symptoms occur in relation to your meals, such as "pain in abdomen 2 hours after  dinner."  Eat your meals slowly and in a relaxed setting.  Aim to eat 5-6 small meals per day. Do not skip meals.  Drink enough fluid to keep your urine pale yellow.  Ask your health care provider if you should take an over-the-counter probiotic to help restore healthy bacteria in your gut (digestive tract). ? Probiotics are foods that contain good bacteria and yeasts.  Your dietitian may have specific dietary recommendations for you based on your symptoms. He or she may recommend that you: ? Avoid foods that cause symptoms. Talk with your dietitian about other ways to get the same nutrients  that are in those problem foods. ? Avoid foods with gluten. Gluten is a protein that is found in rye, wheat, and barley. ? Eat more foods that contain soluble fiber. Examples of foods with high soluble fiber include oats, seeds, and certain fruits and vegetables. Take a fiber supplement if directed by your dietitian. ? Reduce or avoid certain foods called FODMAPs. These are foods that contain carbohydrates that are hard to digest. Ask your doctor which foods contain these carbohydrates. What foods are not recommended? The following are some foods and drinks that may make your symptoms worse:  Fatty foods, such as french fries.  Foods that contain gluten, such as pasta and cereal.  Dairy products, such as milk, cheese, and ice cream.  Chocolate.  Alcohol.  Products with caffeine, such as coffee.  Carbonated drinks, such as soda.  Foods that are high in FODMAPs. These include certain fruits and vegetables.  Products with sweeteners such as honey, high fructose corn syrup, sorbitol, and mannitol. The items listed above may not be a complete list of foods and beverages you should avoid. Contact a dietitian for more information. What foods are good sources of fiber? Your health care provider or dietitian may recommend that you eat more foods that contain fiber. Fiber can help to reduce constipation and other IBS symptoms. Add foods with fiber to your diet a little at a time so your body can get used to them. Too much fiber at one time might cause gas and swelling of your abdomen. The following are some foods that are good sources of fiber:  Berries, such as raspberries, strawberries, and blueberries.  Tomatoes.  Carrots.  Brown rice.  Oats.  Seeds, such as chia and pumpkin seeds. The items listed above may not be a complete list of recommended sources of fiber. Contact your dietitian for more options. Where to find more information  International Foundation for Functional  Gastrointestinal Disorders: www.iffgd.[employer] of Diabetes and Digestive and Kidney Diseases: [employer] Summary  When you have irritable bowel syndrome (IBS), it is very important to eat the foods and follow the eating habits that are best for your condition.  IBS may cause various symptoms such as pain in the abdomen, constipation, or diarrhea.  Choosing the right foods can help to ease the discomfort that comes from symptoms.  Keep a food diary. This will help you identify foods that cause symptoms.  Your health care provider or diet and nutrition specialist (dietitian) may recommend that you eat more foods that contain fiber. This information is not intended to replace advice given to you by your health care provider. Make sure you discuss any questions you have with your health care provider. Document Released: 03/01/2004 Document Revised: 07/07/2018 Document Reviewed: 08/13/2017 Elsevier Interactive Patient Education  2019 [place]  FODMAPs (fermentable oligosaccharides, disaccharides, monosaccharides, and  polyols) are sugars that are hard for some people to digest. A low-FODMAP eating plan may help some people who have bowel (intestinal) diseases to manage their symptoms. This meal plan can be complicated to follow. Work with a diet and nutrition specialist (dietitian) to make a low-FODMAP eating plan that is right for you. A dietitian can make sure that you get enough nutrition from this diet. What are tips for following this plan? Reading food labels  Check labels for hidden FODMAPs such as: ? High-fructose syrup. ? Honey. ? Agave. ? Natural fruit flavors. ? Onion or garlic powder.  Choose low-FODMAP foods that contain 3-4 grams of fiber per serving.  Check food labels for serving sizes. Eat only one serving at a time to make sure FODMAP levels stay low. Meal planning  Follow a low-FODMAP eating plan for up to 6 weeks,  or as told by your health care provider or dietitian.  To follow the eating plan: 1. Eliminate high-FODMAP foods from your diet completely. 2. Gradually reintroduce high-FODMAP foods into your diet one at a time. Most people should wait a few days after introducing one high-FODMAP food before they introduce the next high-FODMAP food. Your dietitian can recommend how quickly you may reintroduce foods. 3. Keep a daily record of what you eat and drink, and make note of any symptoms that you have after eating. 4. Review your daily record with a dietitian regularly. Your dietitian can help you identify which foods you can eat and which foods you should avoid. General tips  Drink enough fluid each day to keep your urine pale yellow.  Avoid processed foods. These often have added sugar and may be high in FODMAPs.  Avoid most dairy products, whole grains, and sweeteners.  Work with a dietitian to make sure you get enough fiber in your diet. Recommended foods Grains  Gluten-free grains, such as rice, oats, buckwheat, quinoa, corn, polenta, and millet. Gluten-free pasta, bread, or cereal. Rice noodles. Corn tortillas. Vegetables  Eggplant, zucchini, cucumber, peppers, green beans, Brussels sprouts, bean sprouts, lettuce, arugula, kale, Swiss chard, spinach, collard greens, bok choy, summer squash, potato, and tomato. Limited amounts of corn, carrot, and sweet potato. Green parts of scallions. Fruits  Bananas, oranges, lemons, limes, blueberries, raspberries, strawberries, grapes, cantaloupe, honeydew melon, kiwi, papaya, passion fruit, and pineapple. Limited amounts of dried cranberries, banana chips, and shredded coconut. Dairy  Lactose-free milk, yogurt, and kefir. Lactose-free cottage cheese and ice cream. Non-dairy milks, such as almond, coconut, hemp, and rice milk. Yogurts made of non-dairy milks. Limited amounts of goat cheese, brie, mozzarella, parmesan, swiss, and other hard  cheeses. Meats and other protein foods  Unseasoned beef, pork, poultry, or fish. Eggs. Berniece Salines. Tofu (firm) and tempeh. Limited amounts of nuts and seeds, such as almonds, walnuts, [place] nuts, pecans, peanuts, pumpkin seeds, chia seeds, and sunflower seeds. Fats and oils  Butter-free spreads. Vegetable oils, such as olive, canola, and sunflower oil. Seasoning and other foods  Artificial sweeteners with names that do not end in "ol" such as aspartame, saccharine, and stevia. Maple syrup, white table sugar, raw sugar, brown sugar, and molasses. Fresh basil, coriander, parsley, rosemary, and thyme. Beverages  Water and mineral water. Sugar-sweetened soft drinks. Small amounts of orange juice or cranberry juice. Black and green tea. Most dry wines. Coffee. This may not be a complete list of low-FODMAP foods. Talk with your dietitian for more information. Foods to avoid Grains  Wheat, including kamut, durum, and semolina. Barley and bulgur. Couscous.  Wheat-based cereals. Wheat noodles, bread, crackers, and pastries. Vegetables  Chicory root, artichoke, asparagus, cabbage, snow peas, sugar snap peas, mushrooms, and cauliflower. Onions, garlic, leeks, and the white part of scallions. Fruits  Fresh, dried, and juiced forms of apple, pear, watermelon, peach, plum, cherries, apricots, blackberries, boysenberries, figs, nectarines, and mango. Avocado. Dairy  Milk, yogurt, ice cream, and soft cheese. Cream and sour cream. Milk-based sauces. Custard. Meats and other protein foods  Fried or fatty meat. Sausage. Cashews and pistachios. Soybeans, baked beans, black beans, chickpeas, kidney beans, fava beans, navy beans, lentils, and split peas. Seasoning and other foods  Any sugar-free gum or candy. Foods that contain artificial sweeteners such as sorbitol, mannitol, isomalt, or xylitol. Foods that contain honey, high-fructose corn syrup, or agave. Bouillon, vegetable stock, beef stock, and chicken  stock. Garlic and onion powder. Condiments made with onion, such as hummus, chutney, pickles, relish, salad dressing, and salsa. Tomato paste. Beverages  Chicory-based drinks. Coffee substitutes. Chamomile tea. Fennel tea. Sweet or fortified wines such as port or sherry. Diet soft drinks made with isomalt, mannitol, maltitol, sorbitol, or xylitol. Apple, pear, and mango juice. Juices with high-fructose corn syrup. This may not be a complete list of high-FODMAP foods. Talk with your dietitian to discuss what dietary choices are best for you.  Summary  A low-FODMAP eating plan is a short-term diet that eliminates FODMAPs from your diet to help ease symptoms of certain bowel diseases.  The eating plan usually lasts up to 6 weeks. After that, high-FODMAP foods are restarted gradually, one at a time, so you can find out which may be causing symptoms.  A low-FODMAP eating plan can be complicated. It is best to work with a dietitian who has experience with this type of plan. This information is not intended to replace advice given to you by your health care provider. Make sure you discuss any questions you have with your health care provider. Document Released: 08/06/2017 Document Revised: 08/06/2017 Document Reviewed: 08/06/2017 Elsevier Interactive Patient Education  [employer].

## 2019-01-10 ENCOUNTER — Encounter: Payer: Self-pay | Admitting: Internal Medicine

## 2019-01-21 ENCOUNTER — Ambulatory Visit
Admission: RE | Admit: 2019-01-21 | Discharge: 2019-01-21 | Disposition: A | Discharge status: Home / Self Care | Payer: Medicare Other | Source: Ambulatory Visit | Attending: Internal Medicine | Admitting: Internal Medicine

## 2019-01-21 DIAGNOSIS — R911 Solitary pulmonary nodule: Secondary | ICD-10-CM | POA: Insufficient documentation

## 2019-01-21 DIAGNOSIS — R918 Other nonspecific abnormal finding of lung field: Secondary | ICD-10-CM | POA: Diagnosis not present

## 2019-01-22 ENCOUNTER — Encounter: Payer: Self-pay | Admitting: Nurse Practitioner

## 2019-01-26 ENCOUNTER — Encounter: Payer: Self-pay | Admitting: Internal Medicine

## 2019-01-26 ENCOUNTER — Ambulatory Visit (INDEPENDENT_AMBULATORY_CARE_PROVIDER_SITE_OTHER): Payer: Medicare Other | Admitting: Internal Medicine

## 2019-01-26 VITALS — BP 146/88 | HR 80 | Resp 16 | Ht 65.0 in | Wt 196.0 lb

## 2019-01-26 DIAGNOSIS — I2510 Atherosclerotic heart disease of native coronary artery without angina pectoris: Secondary | ICD-10-CM

## 2019-01-26 DIAGNOSIS — J449 Chronic obstructive pulmonary disease, unspecified: Secondary | ICD-10-CM

## 2019-01-26 DIAGNOSIS — R918 Other nonspecific abnormal finding of lung field: Secondary | ICD-10-CM | POA: Diagnosis not present

## 2019-01-26 DIAGNOSIS — G4733 Obstructive sleep apnea (adult) (pediatric): Secondary | ICD-10-CM

## 2019-01-26 NOTE — Patient Instructions (Signed)
Follow up in 1 year with CT chest  Continue CPAP as prescribed

## 2019-01-26 NOTE — Progress Notes (Signed)
[facility] Consultation      Date: 01/26/2019,   MRN# [id number] Claire Thomas 06-20-1951    AdmissionWeight: 196 lb (88.9 kg)                 CurrentWeight: 196 lb (88.9 kg) Claire Thomas is a 68 y.o. old female seen in consultation for abnormal CT chest at the request of Dr. Carlean Purl.  PFT 10/2016  shows NO obstruction and no restriction DLCO WNL Ratio 81% Fev1 2.2L 93% predicted FVC 2.9L 88% predicted   CHIEF COMPLAINT:   Follow up COPD Follow up OSA Follow up Abnormal CT chest lung nodules   HISTORY OF PRESENT ILLNESS    No acute respiratory issues at this time Home sleep study in 2018 shows AHI of 13 with desaturations Doing well with auto CPAP therapy  She is compliant at 87% 77% compliant with greater than 4 hours AHI is down to 1.3 Auto CPAP 5 to 10 cm of pressure Using nasal mask  No acute respiratory issues at this time No shortness of breath no wheezing No chest pain Has not used used inhalers in a while   CT of the chest reviewed with patient Follow-up right upper lobe pulmonary nodule has remained the same size over the last 1 year Repeat CT chest in 1 year pending Right upper lobe sub-solid nodule 8 x 12 mm             Current Medication:  Current Outpatient Medications:  .  albuterol (PROVENTIL HFA;VENTOLIN HFA) 108 (90 Base) MCG/ACT inhaler, Inhale 2 puffs into the lungs every 4 (four) hours as needed for wheezing or shortness of breath., Disp: 1 Inhaler, Rfl: 5 .  Calcium-Vitamin D 600-200 MG-UNIT per tablet, Take 1 tablet by mouth 2 (two) times daily.  , Disp: , Rfl:  .  Cholecalciferol 125 MCG (5000 UT) TABS, Take 1 tablet by mouth daily. , Disp: , Rfl:  .  dicyclomine (BENTYL) 20 MG tablet, TAKE 1/2 TO 1 TABLET BY MOUTH 30 MINUTES BEFORE MEALS AS NEEDED, Disp: 120 tablet, Rfl: 0 .  pantoprazole (PROTONIX) 40 MG tablet, Take 1 tablet (40 mg total) by mouth daily., Disp: 30 tablet, Rfl: 6    ALLERGIES    Patient has no known allergies.     REVIEW OF SYSTEMS   Review of Systems  Constitutional: Negative for chills, diaphoresis, fever, malaise/fatigue and weight loss.  HENT: Negative for congestion and hearing loss.   Eyes: Negative for blurred vision and double vision.  Respiratory: Negative for cough, hemoptysis, shortness of breath and wheezing.   Cardiovascular: Negative for chest pain, palpitations and orthopnea.  Skin: Negative for rash.  Neurological: Negative for weakness.  All other systems reviewed and are negative.   BP (!) 146/88 (BP Location: Left Arm, Cuff Size: Large)   Pulse 80   Resp 16   Ht 5\' 5"  (1.651 m)   Wt 196 lb (88.9 kg)   SpO2 97%   BMI 32.62 kg/m     PHYSICAL EXAM  Physical Exam  Constitutional: She is oriented to person, place, and time. She appears well-developed and well-nourished. No distress.  Cardiovascular: Normal rate, regular rhythm and normal heart sounds.  No murmur heard. Pulmonary/Chest: Effort normal and breath sounds normal. No stridor. No respiratory distress. She has no wheezes.  Musculoskeletal: Normal range of motion.        General: No edema.  Neurological: She is alert and oriented to person, place, and time.  No cranial nerve deficit.  Skin: Skin is warm. She is not diaphoretic.  Psychiatric: She has a normal mood and affect.       IMAGING   CT chest 07/2016 With patient, RLL nodule, RUL nodular opacity Will need repeat CT chest in 3 months CT chest 02/2017 stable b/l pulm nodules  CT chest 10/02/2017 reviewed with patient The right upper lobe nodule opacity seems to have slightly increased in size All other nodules are stable  CT chest 1.21.19 Nodules stable since 2017 Will repeat CT chest in 1 year  CT chest 01/14/2019 Nodule stable since 2017 We will repeat CT chest in 1 year CTc hest 02/2017  ASSESSMENT/PLAN   68 year old white female with extensive secondhand smoke exposure with intermittent  shortness of breath with intermittent reactive airways disease with subcentimeter pulmonary sub-solid nodules with history of sleep apnea  #1 right upper lobe sub-solid nodule stable over the past several years We will repeat CT chest in 1 year  Right upper lobe sub-solid nodule 8 x 12 mm Left lower lobe subside with nodule approximately 6 mm   #2 intermittent reactive airways disease Use albuterol as needed  #3 OSA continue CPAP therapy as prescribed Patient uses and benefits from auto CPAP Uses nasal mask Continue as prescribed   Obesity -recommend significant weight loss -recommend changing diet  Deconditioned state -Recommend increased daily activity and exercise     Patient satisfied with Plan of action and management. All questions answered Follow up in 1 year   Joyelle Siedlecki Patricia Pesa, M.D.  [facility]  Medical Director [place] Director [facility]

## 2019-03-30 ENCOUNTER — Telehealth: Payer: Self-pay

## 2019-03-30 NOTE — Telephone Encounter (Signed)
LMOV for patient to call back.  Patient will need a VIDEO visit or an office visit.

## 2019-04-01 ENCOUNTER — Other Ambulatory Visit: Payer: Self-pay | Admitting: Internal Medicine

## 2019-04-01 DIAGNOSIS — M8580 Other specified disorders of bone density and structure, unspecified site: Secondary | ICD-10-CM

## 2019-04-01 NOTE — Telephone Encounter (Signed)
LMOV for patient to call back.  Patient will need a Video visit or come in office.

## 2019-04-01 NOTE — Telephone Encounter (Signed)
Patient declined evisit  Wants to r/s when schedule available

## 2019-04-07 ENCOUNTER — Encounter: Payer: Self-pay | Admitting: Internal Medicine

## 2019-04-08 ENCOUNTER — Encounter: Payer: Self-pay | Admitting: Internal Medicine

## 2019-04-08 DIAGNOSIS — G4733 Obstructive sleep apnea (adult) (pediatric): Secondary | ICD-10-CM | POA: Insufficient documentation

## 2019-04-08 DIAGNOSIS — Z9989 Dependence on other enabling machines and devices: Secondary | ICD-10-CM

## 2019-04-09 ENCOUNTER — Other Ambulatory Visit: Payer: Medicare Other

## 2019-04-22 ENCOUNTER — Ambulatory Visit: Payer: Medicare Other | Admitting: Cardiovascular Disease

## 2019-05-19 ENCOUNTER — Telehealth: Payer: Self-pay | Admitting: Internal Medicine

## 2019-05-19 ENCOUNTER — Other Ambulatory Visit: Payer: Self-pay | Admitting: Internal Medicine

## 2019-05-19 ENCOUNTER — Encounter: Payer: Self-pay | Admitting: Internal Medicine

## 2019-05-19 DIAGNOSIS — N644 Mastodynia: Secondary | ICD-10-CM

## 2019-05-19 NOTE — Telephone Encounter (Signed)
I ordered a stat diagnostic mammogram and [place] call pt and advise  Call [place] and try to move mammogram up   Thanks [place]

## 2019-05-28 ENCOUNTER — Other Ambulatory Visit: Payer: Medicare Other

## 2019-06-08 ENCOUNTER — Ambulatory Visit
Admission: RE | Admit: 2019-06-08 | Discharge: 2019-06-08 | Disposition: A | Discharge status: Home / Self Care | Payer: Medicare Other | Source: Ambulatory Visit | Attending: Internal Medicine | Admitting: Internal Medicine

## 2019-06-08 ENCOUNTER — Other Ambulatory Visit: Payer: Self-pay

## 2019-06-08 ENCOUNTER — Other Ambulatory Visit: Payer: Medicare Other

## 2019-06-08 DIAGNOSIS — N644 Mastodynia: Secondary | ICD-10-CM

## 2019-06-08 DIAGNOSIS — R922 Inconclusive mammogram: Secondary | ICD-10-CM | POA: Diagnosis not present

## 2019-06-18 ENCOUNTER — Ambulatory Visit: Payer: Medicare Other

## 2019-06-18 ENCOUNTER — Other Ambulatory Visit: Payer: Medicare Other

## 2019-07-14 ENCOUNTER — Encounter: Payer: Self-pay | Admitting: Internal Medicine

## 2019-07-15 ENCOUNTER — Ambulatory Visit: Payer: Medicare Other | Admitting: Internal Medicine

## 2019-08-08 NOTE — Progress Notes (Signed)
Cardiology Office Note  Date:  08/10/2019   ID:  Claire Thomas, DOB April 18, 1951, MRN [id number]  PCP:  Claire Thomas, Claire Thomas   Chief Complaint  Patient presents with  . New Patient (Initial Visit)    CAD Hx Claire Thomas.NO CP, SOB with exertion, and some swelling right foot and leg at times.  Medications reviewed verbally.     HPI:  Ms Claire Thomas is a 68 yo woman with PMH of  OSA Nonsmoker Hypertension Hyperlipidemia GERD Prior history atypical chest pain evaluated 2015 with stress echocardiogram and cardiac catheterization with minor irregularities without obstructive disease COPD, Right upper lobe sub-solid nodule 8 x 12 mm Referral from Dr. Orland Thomas for  epigastric pain, LAD dz noted on CT chest 2017/2019   CT chest 2017 and 2019 in LAD c/o intermittent epigastric pain with stress  Images pulled up and reviewed with her in detail There is a punctate/focal region of coronary calcification noted in the proximal LAD No significant plaque noted in the carotids, aortic arch, descending aorta or even the ascending aorta  Reports having some shortness of breath on exertion, chronic issue Weight has been trending higher, no regular exercise Takes care of her father who has dementia Brother does not help She also works 47 to 39 hours/week, full-time  Previous cardiac catheterization reviewed showing minimal disease 2015  EKG personally reviewed by myself on todays visit Shows normal sinus rhythm rate 59 bpm no significant ST or T wave changes  PMH:   has a past medical history of Arthritis, Asthma, CAD (coronary artery disease) (10/21/2018), Chicken pox, Colon polyps, Frequent headaches, GERD (gastroesophageal reflux disease), IBS (irritable bowel syndrome), Osteopenia, and Sleep apnea.  PSH:    Past Surgical History:  Procedure Laterality Date  . ABDOMINAL HYSTERECTOMY     1976 DUB, pelvic pain ovaries intact   . CARDIAC CATHETERIZATION  07/19/2014   [facility]  .  CARPAL TUNNEL RELEASE Bilateral   . COLONOSCOPY    . ESOPHAGOGASTRODUODENOSCOPY    . MENISCUS REPAIR Left   . PARTIAL HYSTERECTOMY    . UPPER GASTROINTESTINAL ENDOSCOPY      Current Outpatient Medications  Medication Sig Dispense Refill  . albuterol (PROVENTIL HFA;VENTOLIN HFA) 108 (90 Base) MCG/ACT inhaler Inhale 2 puffs into the lungs every 4 (four) hours as needed for wheezing or shortness of breath. 1 Inhaler 5  . Calcium-Vitamin D 600-200 MG-UNIT per tablet Take 1 tablet by mouth 2 (two) times daily.      . Cholecalciferol 125 MCG (5000 UT) TABS Take 1 tablet by mouth daily.     Marland Kitchen dicyclomine (BENTYL) 20 MG tablet TAKE 1/2 TO 1 TABLET BY MOUTH 30 MINUTES BEFORE MEALS AS NEEDED 120 tablet 0  . pantoprazole (PROTONIX) 40 MG tablet Take 1 tablet (40 mg total) by mouth daily. 30 tablet 6   No current facility-administered medications for this visit.      Allergies:   Patient has no known allergies.   Social History:  The patient  reports that she has never smoked. She has never used smokeless tobacco. She reports current alcohol use. She reports that she does not use drugs.   Family History:   family history includes AAA (abdominal aortic aneurysm) in her father; Arthritis in her mother and son; Asthma in her paternal grandfather; Breast cancer (age of onset: 15) in her mother; Cancer in her father and mother; Colon cancer in her mother; Dementia in her father; Diabetes in her paternal grandmother; Heart disease  in her maternal grandfather; Hyperlipidemia in her brother, father, and mother; Hypertension in her brother and father; Prostate cancer in her father; Pulmonary fibrosis in her mother.    Review of Systems: Review of Systems  Constitutional: Negative.   HENT: Negative.   Respiratory: Negative.   Cardiovascular: Positive for leg swelling.  Gastrointestinal: Negative.   Musculoskeletal: Negative.   Neurological: Negative.   Psychiatric/Behavioral: Negative.   All other  systems reviewed and are negative.   PHYSICAL EXAM: VS:  BP (!) 144/80 (BP Location: Left Arm, Patient Position: Sitting, Cuff Size: Normal)   Pulse (!) 58   Ht 5\' 6"  (1.676 m)   Wt 198 lb (89.8 kg)   SpO2 98%   BMI 31.96 kg/m  , BMI Body mass index is 31.96 kg/m. GEN: Well nourished, well developed, in no acute distress HEENT: normal Neck: no JVD, carotid bruits, or masses Cardiac: RRR; no murmurs, rubs, or gallops,no edema  Respiratory:  clear to auscultation bilaterally, normal work of breathing GI: soft, nontender, nondistended, + BS MS: no deformity or atrophy Skin: warm and dry, no rash Neuro:  Strength and sensation are intact Psych: euthymic mood, full affect   Recent Labs: 10/21/2018: TSH 1.46    Lipid Panel Lab Results  Component Value Date   CHOL 224 (H) 03/04/2014   HDL 56.80 03/04/2014   LDLCALC 137 (H) 03/04/2014   TRIG 151.0 (H) 03/04/2014      Wt Readings from Last 3 Encounters:  08/10/19 198 lb (89.8 kg)  01/26/19 196 lb (88.9 kg)  01/06/19 193 lb 6.4 oz (87.7 kg)      ASSESSMENT AND PLAN:  Problem List Items Addressed This Visit      Cardiology Problems   CAD (coronary artery disease) - Primary   HTN (hypertension), benign   PURE HYPERCHOLESTEROLEMIA     Other   Centrilobular emphysema ([place])     Prior cardiac catheterization 2015 minimal disease Likely of minimal progression over the past 5 years Reports chronic shortness of breath, likely secondary to deconditioning weight gain.  Was having similar symptoms 5 years ago Recommended regular exercise program for conditioning Suggested we be more aggressive with her cholesterol given coronary calcification seen on CT scan She prefers no statin at this time, we will start Zetia 10 mg daily Recommended walking program, weight loss, lifestyle modification  Suggested if she has worsening chest tightness on exertion that we consider additional testing  Disposition:   F/U as needed    Total encounter time more than 45 minutes  Greater than 50% was spent in counseling and coordination of care with the patient    Signed, Claire Thomas, M.D., Ph.D. [place], [place]

## 2019-08-10 ENCOUNTER — Ambulatory Visit (INDEPENDENT_AMBULATORY_CARE_PROVIDER_SITE_OTHER): Payer: Medicare Other | Admitting: Cardiovascular Disease

## 2019-08-10 ENCOUNTER — Other Ambulatory Visit: Payer: Self-pay

## 2019-08-10 VITALS — BP 144/80 | HR 58 | Ht 66.0 in | Wt 198.0 lb

## 2019-08-10 DIAGNOSIS — I2510 Atherosclerotic heart disease of native coronary artery without angina pectoris: Secondary | ICD-10-CM

## 2019-08-10 DIAGNOSIS — J432 Centrilobular emphysema: Secondary | ICD-10-CM

## 2019-08-10 DIAGNOSIS — I25118 Atherosclerotic heart disease of native coronary artery with other forms of angina pectoris: Secondary | ICD-10-CM | POA: Diagnosis not present

## 2019-08-10 DIAGNOSIS — I10 Essential (primary) hypertension: Secondary | ICD-10-CM

## 2019-08-10 DIAGNOSIS — E7800 Pure hypercholesterolemia, unspecified: Secondary | ICD-10-CM

## 2019-08-10 MED ORDER — EZETIMIBE 10 MG PO TABS: 10.0000 mg | ORAL_TABLET | Freq: Every day | ORAL | 3 refills | Status: DC

## 2019-08-10 NOTE — Patient Instructions (Addendum)
Please call if you have worsening shortness of breath, chest tightness with exertion  Medication Instructions:  Please start zetia one a day  If you need a refill on your cardiac medications before your next appointment, please call your pharmacy.    Lab work: No new labs needed   If you have labs (blood work) drawn today and your tests are completely normal, you will receive your results only by: Marland Kitchen MyChart Message (if you have MyChart) OR . A paper copy in the mail If you have any lab test that is abnormal or we need to change your treatment, we will call you to review the results.   Testing/Procedures: No new testing needed   Follow-Up: At [facility], you and your health needs are our priority.  As part of our continuing mission to provide you with exceptional heart care, we have created designated Provider Care Teams.  These Care Teams include your primary Cardiologist (physician) and Advanced Practice Providers (APPs -  Physician Assistants and Nurse Practitioners) who all work together to provide you with the care you need, when you need it.  . You will need a follow up appointment in 12 months .   Please call our office 2 months in advance to schedule this appointment.    . Providers on your designated Care Team:   . Murray Hodgkins, NP . Christell Faith, PA-C . Marrianne Mood, PA-C  Any Other Special Instructions Will Be Listed Below (If Applicable).  For educational health videos Log in to : [email or web address] Or : [email or web address], password : [email or web address]

## 2019-08-12 NOTE — Addendum Note (Signed)
Addended by: Alba Destine on: 08/12/2019 02:42 PM   Modules accepted: Orders

## 2019-08-20 ENCOUNTER — Ambulatory Visit: Payer: Medicare Other | Admitting: Internal Medicine

## 2019-10-08 ENCOUNTER — Ambulatory Visit: Payer: Medicare Other | Admitting: Internal Medicine

## 2019-10-12 DIAGNOSIS — M545 Low back pain: Secondary | ICD-10-CM | POA: Diagnosis not present

## 2019-10-12 DIAGNOSIS — N8111 Cystocele, midline: Secondary | ICD-10-CM | POA: Diagnosis not present

## 2019-10-12 DIAGNOSIS — N3946 Mixed incontinence: Secondary | ICD-10-CM | POA: Diagnosis not present

## 2019-10-12 DIAGNOSIS — G8929 Other chronic pain: Secondary | ICD-10-CM | POA: Diagnosis not present

## 2019-10-12 DIAGNOSIS — R159 Full incontinence of feces: Secondary | ICD-10-CM | POA: Diagnosis not present

## 2019-10-12 DIAGNOSIS — R32 Unspecified urinary incontinence: Secondary | ICD-10-CM | POA: Diagnosis not present

## 2019-11-03 IMAGING — CT CT CHEST W/O CM
2 of 4 series · 15 of 36 positions shown, 18 images · non-contrast
Comparison: Chest CT 08/14/2016, 03/11/2017 and 10/02/2017.

CLINICAL DATA: Followup pulmonary nodules. No history of
malignancy.

EXAM:
CT CHEST WITHOUT CONTRAST
TECHNIQUE: Multidetector CT imaging of the chest was performed following the
standard protocol without IV contrast.

[Series 4: chest 2.00 hr68 s3 ax · axial · 0.57mm/px · z∈[-1245,-985]mm · 12 of 154 slices shown, 15 images]
[im 12/154  mediastinal]
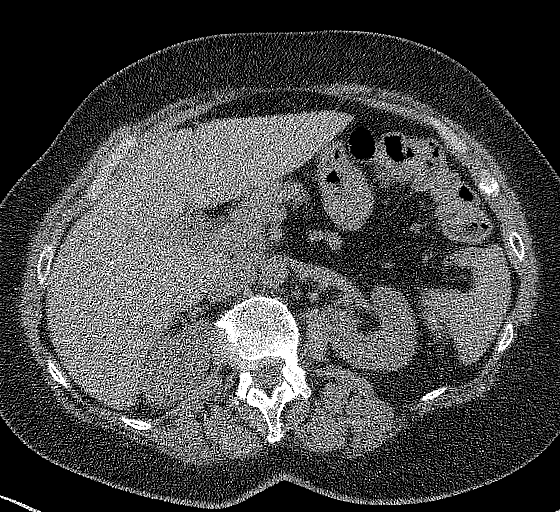
[im 12/154  lung]
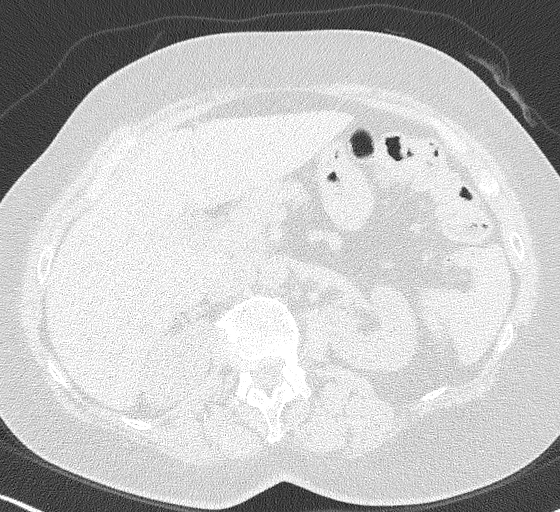
[im 24/154  lung]
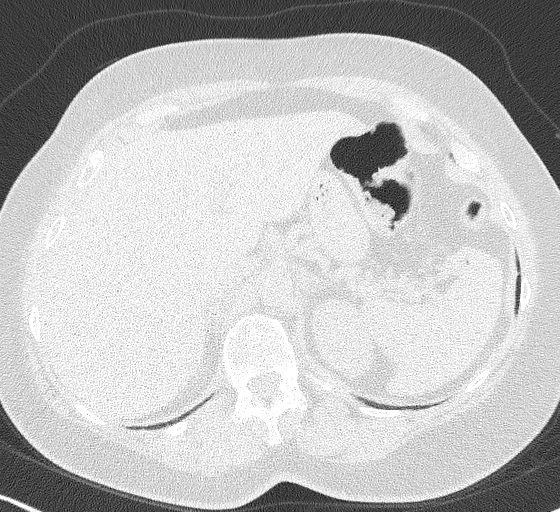
[im 36/154  lung]
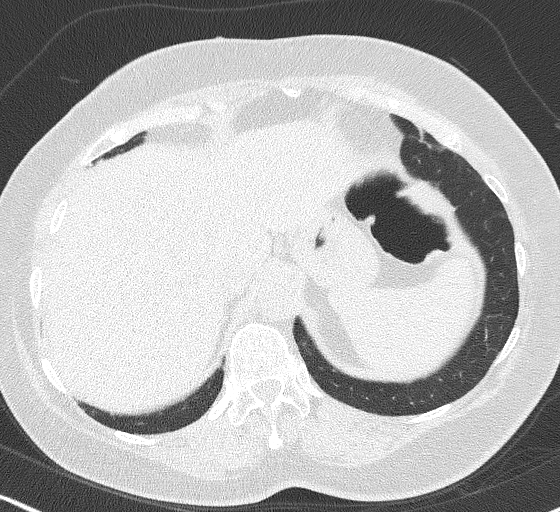
[im 48/154  lung]
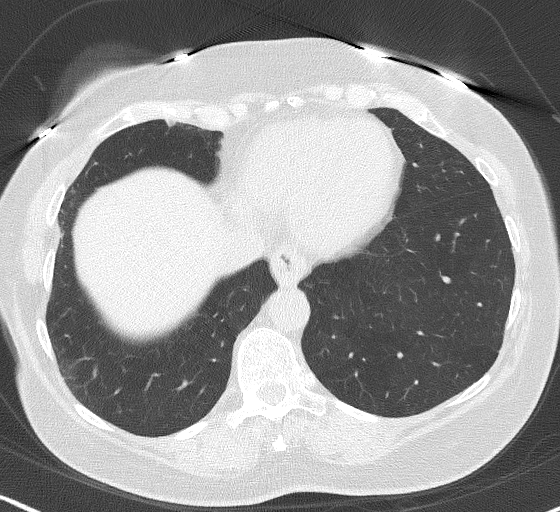
[im 59/154  mediastinal]
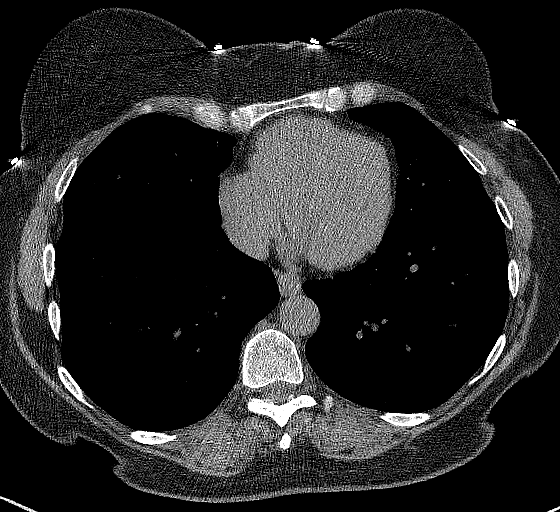
[im 59/154  lung]
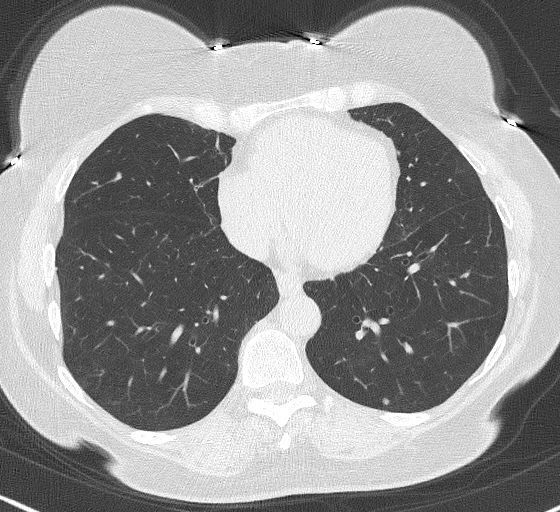
[im 71/154  lung]
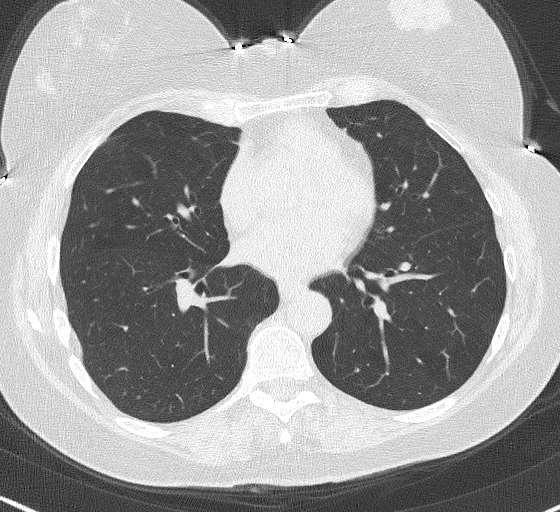
[im 83/154  lung]
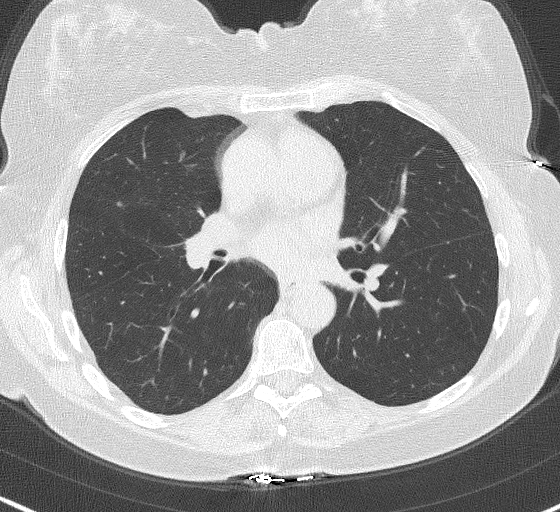
[im 95/154  lung]
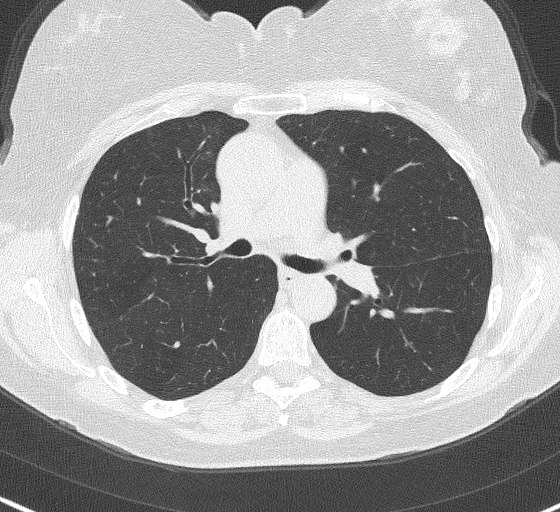
[im 106/154  mediastinal]
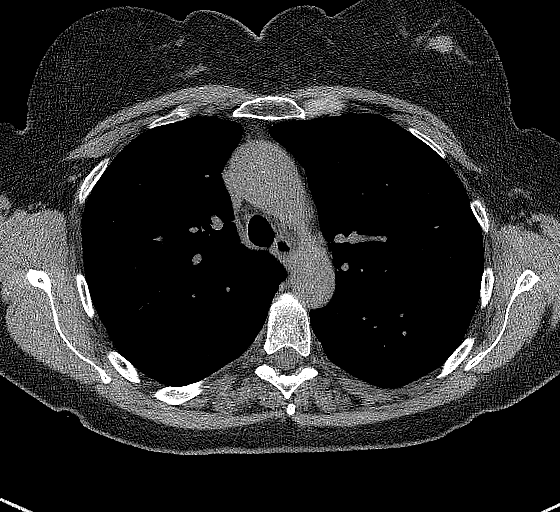
[im 106/154  lung]
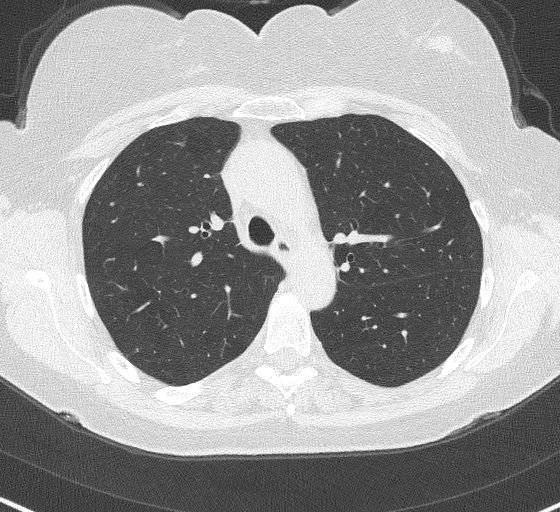
[im 118/154  lung]
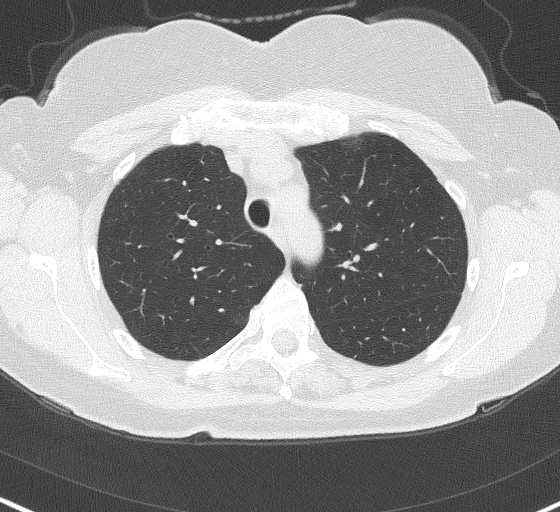
[im 130/154  lung]
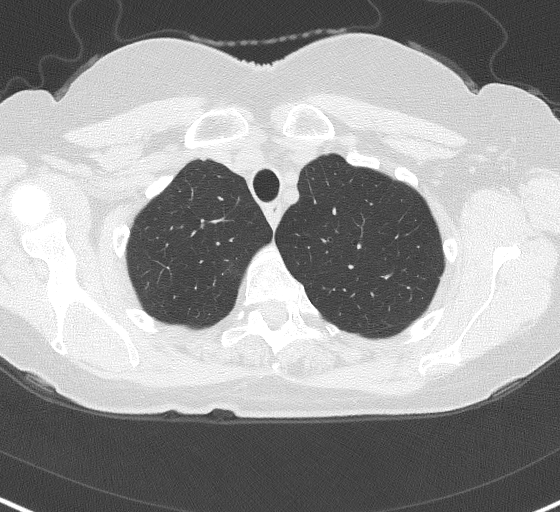
[im 142/154  lung]
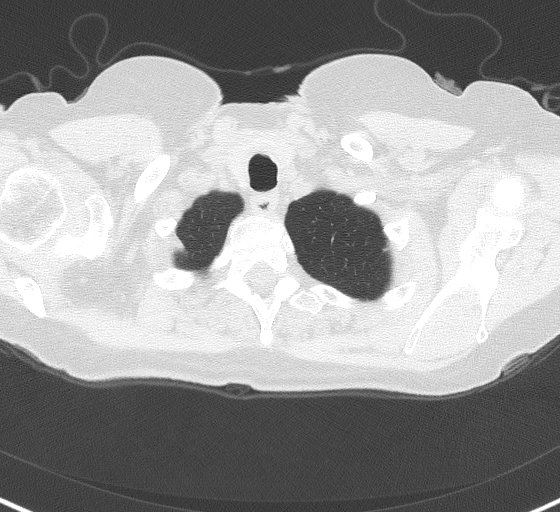

[Series 8: chest 2.00 br40 s3 cor · coronal · 0.62mm/px · 3 of 136 slices shown]
[im 28/136  lung]
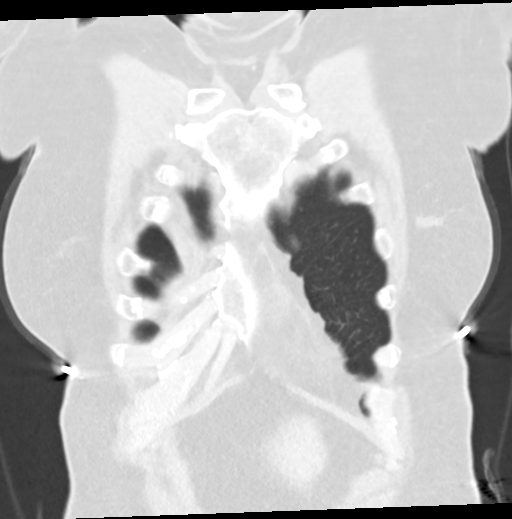
[im 55/136  lung]
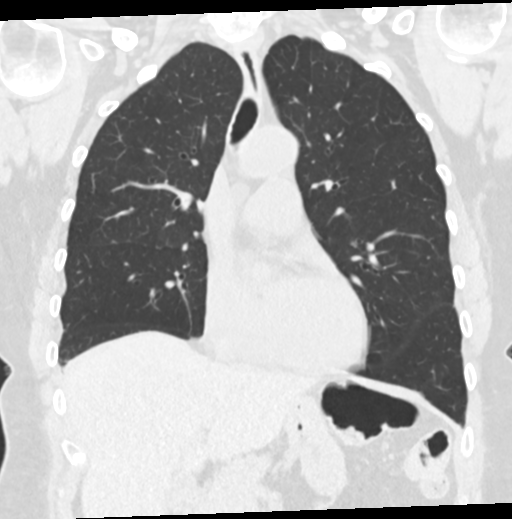
[im 82/136  lung]
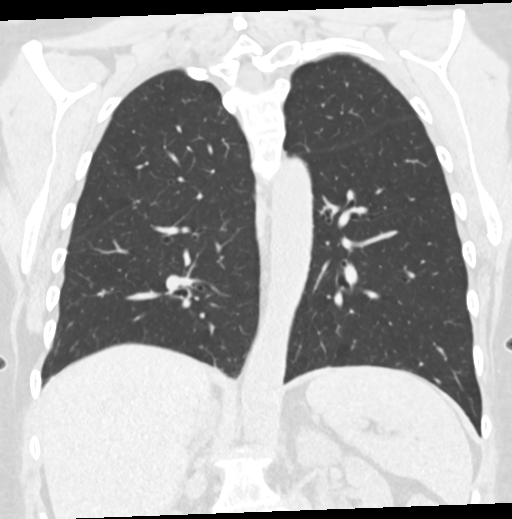

[15 of 36 positions shown; findings below may reference images not displayed]

FINDINGS: Cardiovascular: No acute vascular calcifications. Left anterior
descending coronary artery atherosclerosis again noted. The heart
size is normal. There is no pericardial effusion.

Mediastinum/Nodes: There are no enlarged mediastinal, hilar or
axillary lymph nodes.Hilar assessment is limited by the lack of
intravenous contrast, although the hilar contours appear unchanged.
Small hiatal hernia. The trachea and thyroid gland appear normal.

Lungs/Pleura: No pleural effusion or pneumothorax. There is moderate
centrilobular emphysema. Multiple small solid pulmonary nodules are
stable from the baseline study of 0828. Largest measures 7 x 5 mm in
the right lower lobe on image 115. The sub solid subpleural lesion
anteriorly in the right upper lobe is not significantly changed from
the recent studies, measuring up to 8 mm in diameter. The small
solid component is stable and appears relatively linear on the
reformatted images. No new or enlarging pulmonary nodules.

Upper abdomen: The visualized upper abdomen appears stable without
suspicious findings.

Musculoskeletal/Chest wall: There is no chest wall mass or
suspicious osseous finding. Thoracolumbar scoliosis.
IMPRESSION: 1. Stable solid pulmonary nodules bilaterally from baseline study of
17 months ago, consistent with benign findings. The specific nodules
do not require any specific follow-up.
2. Sub solid right upper lobe lesion also appears stable, although
requires longer follow-up. Annual CT is recommended until 5 years of
stability has been established. This recommendation follows the
consensus statement: Guidelines for Management of Small Pulmonary
Nodules Detected on CT Images: From the [HOSPITAL] 0828;
3. Coronary artery atherosclerosis.
4.  Emphysema ([id number].7).

## 2019-12-01 DIAGNOSIS — Z4689 Encounter for fitting and adjustment of other specified devices: Secondary | ICD-10-CM | POA: Diagnosis not present

## 2020-01-02 ENCOUNTER — Encounter: Payer: Self-pay | Admitting: Internal Medicine

## 2020-01-06 ENCOUNTER — Other Ambulatory Visit: Payer: Self-pay

## 2020-01-06 ENCOUNTER — Ambulatory Visit (INDEPENDENT_AMBULATORY_CARE_PROVIDER_SITE_OTHER): Payer: Medicare Other | Admitting: Internal Medicine

## 2020-01-06 ENCOUNTER — Encounter: Payer: Self-pay | Admitting: Internal Medicine

## 2020-01-06 VITALS — BP 161/95 | Ht 66.0 in | Wt 189.0 lb

## 2020-01-06 DIAGNOSIS — Z1329 Encounter for screening for other suspected endocrine disorder: Secondary | ICD-10-CM | POA: Diagnosis not present

## 2020-01-06 DIAGNOSIS — E559 Vitamin D deficiency, unspecified: Secondary | ICD-10-CM

## 2020-01-06 DIAGNOSIS — I10 Essential (primary) hypertension: Secondary | ICD-10-CM

## 2020-01-06 DIAGNOSIS — Z1389 Encounter for screening for other disorder: Secondary | ICD-10-CM

## 2020-01-06 DIAGNOSIS — E785 Hyperlipidemia, unspecified: Secondary | ICD-10-CM

## 2020-01-06 DIAGNOSIS — Z1231 Encounter for screening mammogram for malignant neoplasm of breast: Secondary | ICD-10-CM

## 2020-01-06 DIAGNOSIS — E2839 Other primary ovarian failure: Secondary | ICD-10-CM

## 2020-01-06 DIAGNOSIS — G4700 Insomnia, unspecified: Secondary | ICD-10-CM

## 2020-01-06 DIAGNOSIS — Z23 Encounter for immunization: Secondary | ICD-10-CM

## 2020-01-06 MED ORDER — OLMESARTAN MEDOXOMIL-HCTZ 20-12.5 MG PO TABS: 1.0000 | ORAL_TABLET | Freq: Every day | ORAL | 3 refills | Status: DC

## 2020-01-06 MED ORDER — TETANUS-DIPHTH-ACELL PERTUSSIS 5-2.5-18.5 LF-MCG/0.5 IM SUSP: 0.5000 mL | Freq: Once | INTRAMUSCULAR | 0 refills | Status: AC

## 2020-01-06 NOTE — Progress Notes (Signed)
Telephone Note  I connected with Claire Thomas  on 01/06/20 at  9:15 AM EST by a telephone and verified that I am speaking with the correct person using two identifiers.  Location patient:home working Location provider:work or home office Persons participating in the virtual visit: patient, provider  I discussed the limitations of evaluation and management by telemedicine and the availability of in person appointments. The patient expressed understanding and agreed to proceed.   HPI: 1. HTN BP 179/99 and 161/95 was on diovan hct in 2015 currently not on meds and BP intermittently elevated over years at times get h/a unknown if related, no dizziness/CP, sob  2. Insomnia sleep interrupted with stressors working full time and caring for dad at times with dementia in his 9s and she stays with him and he is not sleeping. She is sleeping 3-4 hours  3. C/o fatigue which disc likely due to #2  4. HLD on zetia 10 mg qd   ROS: See pertinent positives and negatives per HPI.  Past Medical History:  Diagnosis Date  . Arthritis   . Asthma    worse with exertion   . CAD (coronary artery disease) 10/21/2018  . Chicken pox   . Colon polyps   . Frequent headaches   . GERD (gastroesophageal reflux disease)   . IBS (irritable bowel syndrome)    diarrhea type  . Osteopenia   . Sleep apnea    wears cpap but no 02     Past Surgical History:  Procedure Laterality Date  . ABDOMINAL HYSTERECTOMY     1976 DUB, pelvic pain ovaries intact   . CARDIAC CATHETERIZATION  07/19/2014   [facility]  . CARPAL TUNNEL RELEASE Bilateral   . COLONOSCOPY    . ESOPHAGOGASTRODUODENOSCOPY    . MENISCUS REPAIR Left   . PARTIAL HYSTERECTOMY    . UPPER GASTROINTESTINAL ENDOSCOPY      Family History  Problem Relation Age of Onset  . Colon cancer Mother   . Hyperlipidemia Mother   . Pulmonary fibrosis Mother   . Arthritis Mother   . Cancer Mother        colon   . Breast cancer Mother 78  . AAA (abdominal aortic  aneurysm) Father   . Hyperlipidemia Father   . Hypertension Father   . Prostate cancer Father   . Cancer Father        prostate   . Dementia Father        38s as of 01/06/20  . Diabetes Paternal Grandmother   . Hyperlipidemia Brother   . Hypertension Brother   . Arthritis Son   . Heart disease Maternal Grandfather   . Asthma Paternal Grandfather   . Esophageal cancer Neg Hx   . Stomach cancer Neg Hx   . Rectal cancer Neg Hx   . Colon polyps Neg Hx     SOCIAL HX:  Married with kids     Current Outpatient Medications:  .  albuterol (PROVENTIL HFA;VENTOLIN HFA) 108 (90 Base) MCG/ACT inhaler, Inhale 2 puffs into the lungs every 4 (four) hours as needed for wheezing or shortness of breath., Disp: 1 Inhaler, Rfl: 5 .  Calcium-Vitamin D 600-200 MG-UNIT per tablet, Take 1 tablet by mouth 2 (two) times daily.  , Disp: , Rfl:  .  Cholecalciferol 125 MCG (5000 UT) TABS, Take 1 tablet by mouth daily. , Disp: , Rfl:  .  pantoprazole (PROTONIX) 40 MG tablet, Take 1 tablet (40 mg total) by mouth daily., Disp: 30  tablet, Rfl: 6 .  ezetimibe (ZETIA) 10 MG tablet, Take 1 tablet (10 mg total) by mouth daily., Disp: 90 tablet, Rfl: 3 .  olmesartan-hydrochlorothiazide (BENICAR HCT) 20-12.5 MG tablet, Take 1 tablet by mouth daily. In am. 1/2 pill x 3 days then 1 pill daily, Disp: 90 tablet, Rfl: 3 .  Tdap (BOOSTRIX) 5-2.5-18.5 LF-MCG/0.5 injection, Inject 0.5 mLs into the muscle once for 1 dose., Disp: 0.5 mL, Rfl: 0  EXAM:  VITALS per patient if applicable:  GENERAL: alert, oriented, appears well and in no acute distress  PSYCH/NEURO: pleasant and cooperative, no obvious depression or anxiety, speech and thought processing grossly intact  ASSESSMENT AND PLAN:  Discussed the following assessment and plan:  Essential hypertension - Plan: Comprehensive metabolic panel, CBC with Differential/Platelet, Lipid panel, olmesartan-hydrochlorothiazide (BENICAR HCT) 20-12.5 MG tablet 1/2 pill x 3 days  then 1 pil daily on day 4 in the am  My chart in 1 week  Hyperlipidemia, unspecified hyperlipidemia type - Plan: Lipid panel Cont zetia 10 mg qd   Insomnia, unspecified type likely due to caring for dad  rec reach out to his doctor or neurology to help him better sleep so she can sleep when she cares for him at his house   HM Flu shot not had 2020/21 may get [facility] rec Tdap disc at pharmacy to get Tdap had 03/09/10 due   Had zostervax, shingrix Rx prev not had  prevnar utd  consider pna 23 01/07/20  MMR immune   Pap LMP 1976 hysterectomy ovaries intact no h/o abnormal pap last pap 08/21/12 neg pap neg HPV Mammogram 06/08/19 neg ordered repeat with DEXA Colonoscopy 09/2018 tubular and hyperplastic polyps2 total polypsDr. Carlean Purl repeat in 5 years  DEXA h/o osteopenia 2012 referred repeat ordered  HCV neg 01/26/16  Skin no issues as of 01/06/20 GI Dr. Carlean Purl FH colon cancer mom died age 18 and h/o polyps  Lung Dr. Maryln Manuel [facility]  Dentist Dr. Norman Herrlich    Former PCP [place] -we discussed possible serious and likely etiologies, options for evaluation and workup, limitations of telemedicine visit vs in person visit, treatment, treatment risks and precautions. Pt prefers to treat via telemedicine empirically rather then risking or undertaking an in person visit at this moment. Patient agrees to seek prompt in person care if worsening, new symptoms arise, or if is not improving with treatment.   I discussed the assessment and treatment plan with the patient. The patient was provided an opportunity to ask questions and all were answered. The patient agreed with the plan and demonstrated an understanding of the instructions.   The patient was advised to call back or seek an in-person evaluation if the symptoms worsen or if the condition fails to improve as anticipated.  Time spent 20 min Delorise Jackson, MD

## 2020-01-12 ENCOUNTER — Encounter: Payer: Self-pay | Admitting: Internal Medicine

## 2020-01-12 NOTE — Progress Notes (Signed)
I called pt and left a msg to call ofc to schedule fasting labs.

## 2020-01-19 ENCOUNTER — Encounter: Payer: Self-pay | Admitting: Internal Medicine

## 2020-01-20 ENCOUNTER — Encounter: Payer: Self-pay | Admitting: Internal Medicine

## 2020-01-26 ENCOUNTER — Other Ambulatory Visit: Payer: Self-pay

## 2020-01-26 ENCOUNTER — Other Ambulatory Visit (INDEPENDENT_AMBULATORY_CARE_PROVIDER_SITE_OTHER): Payer: Medicare Other

## 2020-01-26 DIAGNOSIS — Z1329 Encounter for screening for other suspected endocrine disorder: Secondary | ICD-10-CM

## 2020-01-26 DIAGNOSIS — E559 Vitamin D deficiency, unspecified: Secondary | ICD-10-CM

## 2020-01-26 DIAGNOSIS — Z1389 Encounter for screening for other disorder: Secondary | ICD-10-CM

## 2020-01-26 DIAGNOSIS — E785 Hyperlipidemia, unspecified: Secondary | ICD-10-CM | POA: Diagnosis not present

## 2020-01-26 DIAGNOSIS — I10 Essential (primary) hypertension: Secondary | ICD-10-CM | POA: Diagnosis not present

## 2020-01-26 LAB — LIPID PANEL
Cholesterol: 190 mg/dL (ref 0–200)
HDL: 48.8 mg/dL (ref >39.00)
LDL Cholesterol: 105 mg/dL — ABNORMAL HIGH (ref 0–99)
NonHDL: 141.56
Total CHOL/HDL Ratio: 4
Triglycerides: 182 mg/dL — ABNORMAL HIGH (ref 0.0–149.0)
VLDL: 36.4 mg/dL (ref 0.0–40.0)

## 2020-01-26 LAB — CBC WITH DIFFERENTIAL/PLATELET
Basophils Absolute: 0 10*3/uL (ref 0.0–0.1)
Basophils Relative: 0.5 % (ref 0.0–3.0)
Eosinophils Absolute: 0 10*3/uL (ref 0.0–0.7)
Eosinophils Relative: 0.4 % (ref 0.0–5.0)
HCT: 39.4 % (ref 36.0–46.0)
Hemoglobin: 13.2 g/dL (ref 12.0–15.0)
Lymphocytes Relative: 25.8 % (ref 12.0–46.0)
Lymphs Abs: 2.1 10*3/uL (ref 0.7–4.0)
MCHC: 33.6 g/dL (ref 30.0–36.0)
MCV: 94.5 fl (ref 78.0–100.0)
Monocytes Absolute: 0.5 10*3/uL (ref 0.1–1.0)
Monocytes Relative: 6.2 % (ref 3.0–12.0)
Neutro Abs: 5.6 10*3/uL (ref 1.4–7.7)
Neutrophils Relative %: 67.1 % (ref 43.0–77.0)
Platelets: 284 10*3/uL (ref 150.0–400.0)
RBC: 4.17 Mil/uL (ref 3.87–5.11)
RDW: 13 % (ref 11.5–15.5)
WBC: 8.3 10*3/uL (ref 4.0–10.5)

## 2020-01-26 LAB — COMPREHENSIVE METABOLIC PANEL
ALT: 14 U/L (ref 0–35)
AST: 13 U/L (ref 0–37)
Albumin: 4.3 g/dL (ref 3.5–5.2)
Alkaline Phosphatase: 73 U/L (ref 39–117)
BUN: 18 mg/dL (ref 6–23)
CO2: 28 mEq/L (ref 19–32)
Calcium: 9.5 mg/dL (ref 8.4–10.5)
Chloride: 102 mEq/L (ref 96–112)
Creatinine, Ser: 0.81 mg/dL (ref 0.40–1.20)
GFR: 70.22 mL/min (ref >60.00)
Glucose, Bld: 90 mg/dL (ref 70–99)
Potassium: 4.3 mEq/L (ref 3.5–5.1)
Sodium: 138 mEq/L (ref 135–145)
Total Bilirubin: 0.5 mg/dL (ref 0.2–1.2)
Total Protein: 7 g/dL (ref 6.0–8.3)

## 2020-01-26 LAB — TSH: TSH: 2.11 u[IU]/mL (ref 0.35–4.50)

## 2020-01-26 LAB — VITAMIN D 25 HYDROXY (VIT D DEFICIENCY, FRACTURES): VITD: 20.69 ng/mL — ABNORMAL LOW (ref 30.00–100.00)

## 2020-01-27 LAB — URINALYSIS, ROUTINE W REFLEX MICROSCOPIC
Bilirubin Urine: NEGATIVE
Glucose, UA: NEGATIVE
Hgb urine dipstick: NEGATIVE
Ketones, ur: NEGATIVE
Leukocytes,Ua: NEGATIVE
Nitrite: NEGATIVE
Protein, ur: NEGATIVE
Specific Gravity, Urine: 1.012 (ref 1.001–1.03)
pH: <=5 (ref 5.0–8.0)

## 2020-02-02 ENCOUNTER — Ambulatory Visit
Admission: RE | Admit: 2020-02-02 | Discharge: 2020-02-02 | Disposition: A | Discharge status: Home / Self Care | Payer: Medicare Other | Source: Ambulatory Visit | Attending: Internal Medicine | Admitting: Internal Medicine

## 2020-02-02 ENCOUNTER — Other Ambulatory Visit: Payer: Self-pay

## 2020-02-02 DIAGNOSIS — R918 Other nonspecific abnormal finding of lung field: Secondary | ICD-10-CM | POA: Diagnosis not present

## 2020-02-15 ENCOUNTER — Encounter: Payer: Self-pay | Admitting: Acute Care

## 2020-02-15 ENCOUNTER — Other Ambulatory Visit: Payer: Self-pay

## 2020-02-15 ENCOUNTER — Ambulatory Visit (INDEPENDENT_AMBULATORY_CARE_PROVIDER_SITE_OTHER): Payer: Medicare Other | Admitting: Acute Care

## 2020-02-15 DIAGNOSIS — R918 Other nonspecific abnormal finding of lung field: Secondary | ICD-10-CM | POA: Diagnosis not present

## 2020-02-15 DIAGNOSIS — G4733 Obstructive sleep apnea (adult) (pediatric): Secondary | ICD-10-CM

## 2020-02-15 NOTE — Patient Instructions (Signed)
It is good to talk with you today. We will schedule a follow up CT Chest without contrast in 12 months. We will send a request to your DME to see if they can get your old machine set up for use at your father's house.  ( Same settings as you are using at home)  Continue on CPAP at bedtime. You appear to be benefiting from the treatment  Goal is to wear for at least 6 hours each night for maximal clinical benefit. Continue to work on weight loss, as the link between excess weight  and sleep apnea is well established.   Remember to establish a good bedtime routine, and work on sleep hygiene.  Limit daytime naps , avoid stimulants such as caffeine and nicotine close to bedtime, exercise daily to promote sleep quality, avoid heavy , spicy, fried , or rich foods before bed. Ensure adequate exposure to natural light during the day,establish a relaxing bedtime routine with a pleasant sleep environment ( Bedroom between 60 and 67 degrees, turn off bright lights , TV or device screens screens , consider black out curtains or white noise machines) Do not drive if sleepy. Remember to clean mask, tubing, filter, and reservoir once weekly with soapy water.  Follow up with Dr. Mortimer Fries  In 6 months  or before as needed.   Please contact office for sooner follow up if symptoms do not improve or worsen or seek emergency care

## 2020-02-15 NOTE — Progress Notes (Signed)
Virtual Visit via Telephone Note  I connected with Claire Thomas on 02/15/20 at  9:30 AM EST by telephone and verified that I am speaking with the correct person using two identifiers.  Location: Patient: At home Provider: [place], [place], [id number]   I discussed the limitations, risks, security and privacy concerns of performing an evaluation and management service by telephone and the availability of in person appointments. I also discussed with the patient that there may be a patient responsible charge related to this service. The patient expressed understanding and agreed to proceed.   History of Present Illness: Pt. Presents for follow up of OSA and CPAP therapy, in addition to discussion regarding CT Chest to follow pulmonary nodules. Pt. CT Chest, reviewed personally by me,  was read as  Stable pulmonary nodules. Recommendation was for 12 month follow up, which we will order.She states she has not been using her CPAP machine as she has been staying at her 69 year old father's house caring for him 5 nights a week. She states she has been getting about 3 hours of interrupted sleep per night . We have discussed the possibility of getting her old machine up and running with her current settings of 5-10 cm H2O in Auto Set mode for use at her father's house. She is in agreement with trying to do this. We discussed the need to make sure she is taking care of herself while she is caring for her father. She states she has daytime sleepiness most likely from her current situation with caring for her father and little sleep at night , in addition to not having access to her CPAP machine 5 nights per week. She has not had her COVID vaccine.   Observations/Objective: 02/02/2020: CT Chest  Grossly stable 10 x 6 mm sub solid nodule is seen in the right upper lobe. Follow-up CT scan in 12 months is recommended to ensure stability.  Multiple other bilateral pulmonary nodules are noted  which are stable compared to prior exam.   Down Load 01/11/2020-02/09/2020 Auto CPAP 5-10 cm H2O ( Pt is wearing 2 nights per week when she is home) She is caring for her father who has dementia 5 nights per week at his home  Usage days 8/30 days ( 27 %) Usage 6 hours 29 minutes Median pressure is 7.5 cm H2O Max pressure is 9.9 cm Pressure. AHI is 1.5 We discussed increasing pressures to 12, but she states that had been tried and was too high for her. We will leave setting at 5/10 as she has good control on the current settings.   Assessment and Plan: We will schedule a CT Chest without Contrast for 01/2021 to continue to follow your pulmonary nodules. We will send a request to your DME to see if they can get your old machine set up for use at your father's house.  ( Same settings as you are currently  using at home)  Continue on CPAP at bedtime. You appear to be benefiting from the treatment  Goal is to wear for at least 6 hours each night for maximal clinical benefit. Continue to work on weight loss, as the link between excess weight  and sleep apnea is well established.   Remember to establish a good bedtime routine, and work on sleep hygiene.  Limit daytime naps , avoid stimulants such as caffeine and nicotine close to bedtime, exercise daily to promote sleep quality, avoid heavy , spicy, fried , or  rich foods before bed. Ensure adequate exposure to natural light during the day,establish a relaxing bedtime routine with a pleasant sleep environment ( Bedroom between 60 and 67 degrees, turn off bright lights , TV or device screens screens , consider black out curtains or white noise machines) Do not drive if sleepy. Remember to clean mask, tubing, filter, and reservoir once weekly with soapy water.  Follow up with Dr. Mortimer Fries   In  6 months or before as needed.    Hang in there. Let us know if there is anything we can do to help.   Follow Up Instructions: Follow up with Dr. Mortimer Fries   In  6  months or before as needed.   I discussed the assessment and treatment plan with the patient. The patient was provided an opportunity to ask questions and all were answered. The patient agreed with the plan and demonstrated an understanding of the instructions.   The patient was advised to call back or seek an in-person evaluation if the symptoms worsen or if the condition fails to improve as anticipated.  I provided  25 minutes of non-face-to-face time during this encounter.   Magdalen Spatz, NP 02/15/2020  10:51 AM

## 2020-04-05 ENCOUNTER — Encounter: Payer: Self-pay | Admitting: Internal Medicine

## 2020-04-08 ENCOUNTER — Ambulatory Visit: Payer: Medicare Other | Admitting: Internal Medicine

## 2020-06-14 ENCOUNTER — Ambulatory Visit
Admission: RE | Admit: 2020-06-14 | Discharge: 2020-06-14 | Disposition: A | Discharge status: Home / Self Care | Payer: Medicare Other | Source: Ambulatory Visit | Attending: Internal Medicine | Admitting: Internal Medicine

## 2020-06-14 DIAGNOSIS — Z1231 Encounter for screening mammogram for malignant neoplasm of breast: Secondary | ICD-10-CM | POA: Insufficient documentation

## 2020-08-10 ENCOUNTER — Other Ambulatory Visit: Payer: Self-pay

## 2020-08-10 ENCOUNTER — Encounter: Payer: Self-pay | Admitting: Internal Medicine

## 2020-08-10 ENCOUNTER — Ambulatory Visit (INDEPENDENT_AMBULATORY_CARE_PROVIDER_SITE_OTHER): Payer: Medicare Other | Admitting: Internal Medicine

## 2020-08-10 VITALS — BP 136/80 | HR 59 | Temp 98.0°F | Ht 66.0 in | Wt 178.4 lb

## 2020-08-10 DIAGNOSIS — E663 Overweight: Secondary | ICD-10-CM

## 2020-08-10 DIAGNOSIS — M47812 Spondylosis without myelopathy or radiculopathy, cervical region: Secondary | ICD-10-CM

## 2020-08-10 DIAGNOSIS — M542 Cervicalgia: Secondary | ICD-10-CM

## 2020-08-10 DIAGNOSIS — R200 Anesthesia of skin: Secondary | ICD-10-CM

## 2020-08-10 DIAGNOSIS — G250 Essential tremor: Secondary | ICD-10-CM | POA: Diagnosis not present

## 2020-08-10 DIAGNOSIS — E538 Deficiency of other specified B group vitamins: Secondary | ICD-10-CM | POA: Diagnosis not present

## 2020-08-10 DIAGNOSIS — E559 Vitamin D deficiency, unspecified: Secondary | ICD-10-CM

## 2020-08-10 DIAGNOSIS — R030 Elevated blood-pressure reading, without diagnosis of hypertension: Secondary | ICD-10-CM

## 2020-08-10 DIAGNOSIS — E611 Iron deficiency: Secondary | ICD-10-CM

## 2020-08-10 DIAGNOSIS — E785 Hyperlipidemia, unspecified: Secondary | ICD-10-CM

## 2020-08-10 DIAGNOSIS — G8929 Other chronic pain: Secondary | ICD-10-CM

## 2020-08-10 DIAGNOSIS — R519 Headache, unspecified: Secondary | ICD-10-CM

## 2020-08-10 HISTORY — DX: Essential tremor: G25.0

## 2020-08-10 LAB — COMPREHENSIVE METABOLIC PANEL
ALT: 17 U/L (ref 0–35)
AST: 15 U/L (ref 0–37)
Albumin: 4.6 g/dL (ref 3.5–5.2)
Alkaline Phosphatase: 81 U/L (ref 39–117)
BUN: 15 mg/dL (ref 6–23)
CO2: 31 mEq/L (ref 19–32)
Calcium: 10 mg/dL (ref 8.4–10.5)
Chloride: 100 mEq/L (ref 96–112)
Creatinine, Ser: 0.89 mg/dL (ref 0.40–1.20)
GFR: 62.88 mL/min (ref >60.00)
Glucose, Bld: 97 mg/dL (ref 70–99)
Potassium: 4.3 mEq/L (ref 3.5–5.1)
Sodium: 140 mEq/L (ref 135–145)
Total Bilirubin: 0.5 mg/dL (ref 0.2–1.2)
Total Protein: 7.4 g/dL (ref 6.0–8.3)

## 2020-08-10 LAB — CBC WITH DIFFERENTIAL/PLATELET
Basophils Absolute: 0.1 10*3/uL (ref 0.0–0.1)
Basophils Relative: 0.9 % (ref 0.0–3.0)
Eosinophils Absolute: 0.1 10*3/uL (ref 0.0–0.7)
Eosinophils Relative: 0.9 % (ref 0.0–5.0)
HCT: 41.5 % (ref 36.0–46.0)
Hemoglobin: 14.1 g/dL (ref 12.0–15.0)
Lymphocytes Relative: 23.7 % (ref 12.0–46.0)
Lymphs Abs: 1.8 10*3/uL (ref 0.7–4.0)
MCHC: 33.9 g/dL (ref 30.0–36.0)
MCV: 93 fl (ref 78.0–100.0)
Monocytes Absolute: 0.5 10*3/uL (ref 0.1–1.0)
Monocytes Relative: 6.2 % (ref 3.0–12.0)
Neutro Abs: 5.1 10*3/uL (ref 1.4–7.7)
Neutrophils Relative %: 68.3 % (ref 43.0–77.0)
Platelets: 309 10*3/uL (ref 150.0–400.0)
RBC: 4.47 Mil/uL (ref 3.87–5.11)
RDW: 13.4 % (ref 11.5–15.5)
WBC: 7.4 10*3/uL (ref 4.0–10.5)

## 2020-08-10 LAB — VITAMIN D 25 HYDROXY (VIT D DEFICIENCY, FRACTURES): VITD: 28.33 ng/mL — ABNORMAL LOW (ref 30.00–100.00)

## 2020-08-10 LAB — LIPID PANEL
Cholesterol: 183 mg/dL (ref 0–200)
HDL: 53.2 mg/dL (ref >39.00)
LDL Cholesterol: 101 mg/dL — ABNORMAL HIGH (ref 0–99)
NonHDL: 130.25
Total CHOL/HDL Ratio: 3
Triglycerides: 144 mg/dL (ref 0.0–149.0)
VLDL: 28.8 mg/dL (ref 0.0–40.0)

## 2020-08-10 LAB — VITAMIN B12: Vitamin B-12: 340 pg/mL (ref 211–911)

## 2020-08-10 MED ORDER — CLONAZEPAM 0.5 MG PO TABS: 0.2500 mg | ORAL_TABLET | Freq: Every evening | ORAL | 1 refills | Status: DC | PRN

## 2020-08-10 NOTE — Progress Notes (Signed)
Patient flagged: Current status:  PATIENT IS OVERDUE FOR BMI FOLLOW UP PLAN BMI is estimated to be 28.8 based on the last recorded weight and height

## 2020-08-10 NOTE — Progress Notes (Addendum)
Chief Complaint  Patient presents with  . Follow-up   F/u  1. HTN stopped benicar hct 20-12.5 as BP 8/17 she reported was 121/69 2. Left hand tremor with holding 2 cups of coffee or holding mirror and putting make up on with right hand or cooking which is new since last visit and left hand   Review of Systems  Constitutional: Negative for weight loss.  HENT: Negative for hearing loss.   Eyes: Negative for blurred vision.  Respiratory: Negative for shortness of breath.   Cardiovascular: Negative for chest pain.  Gastrointestinal: Negative for abdominal pain.  Musculoskeletal: Negative for falls.  Skin: Negative for rash.  Neurological: Positive for tremors.  Psychiatric/Behavioral: Negative for depression.       +stress caring for dad     Past Medical History:  Diagnosis Date  . Arthritis   . Asthma    worse with exertion   . CAD (coronary artery disease) 10/21/2018  . Chicken pox   . Colon polyps   . Frequent headaches   . GERD (gastroesophageal reflux disease)   . IBS (irritable bowel syndrome)    diarrhea type  . Osteopenia   . Sleep apnea    wears cpap but no 02    Past Surgical History:  Procedure Laterality Date  . ABDOMINAL HYSTERECTOMY     1976 DUB, pelvic pain ovaries intact   . CARDIAC CATHETERIZATION  07/19/2014   [facility]  . CARPAL TUNNEL RELEASE Bilateral   . COLONOSCOPY    . ESOPHAGOGASTRODUODENOSCOPY    . MENISCUS REPAIR Left   . PARTIAL HYSTERECTOMY    . UPPER GASTROINTESTINAL ENDOSCOPY     Family History  Problem Relation Age of Onset  . Colon cancer Mother   . Hyperlipidemia Mother   . Pulmonary fibrosis Mother   . Arthritis Mother   . Cancer Mother        colon   . Breast cancer Mother 37  . AAA (abdominal aortic aneurysm) Father   . Hyperlipidemia Father   . Hypertension Father   . Prostate cancer Father   . Cancer Father        prostate   . Dementia Father        49s as of 01/06/20  . Diabetes Paternal Grandmother   . Hyperlipidemia  Brother   . Hypertension Brother   . Arthritis Son   . Heart disease Maternal Grandfather   . Asthma Paternal Grandfather   . Esophageal cancer Neg Hx   . Stomach cancer Neg Hx   . Rectal cancer Neg Hx   . Colon polyps Neg Hx    Social History   Socioeconomic History  . Marital status: Married    Spouse name: Not on file  . Number of children: 2  . Years of education: Not on file  . Highest education level: Not on file  Occupational History  . Occupation: Glass blower/designer  Tobacco Use  . Smoking status: Never Smoker  . Smokeless tobacco: Never Used  Vaping Use  . Vaping Use: Never used  Substance and Sexual Activity  . Alcohol use: Yes    Alcohol/week: 0.0 standard drinks    Comment: occassioinal-wine every 6 months  . Drug use: No  . Sexual activity: Yes    Comment: men  Other Topics Concern  . 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 Difficulty of Paying Living Expenses:   Food Insecurity:   . Worried About Charity fundraiser in the Last Year:   . Arboriculturist in the Last Year:   Transportation Needs:   . Film/video editor (Medical):   Marland Kitchen Lack of Transportation (Non-Medical):   Physical Activity:   . Days of Exercise per Week:   . Minutes of Exercise per Session:   Stress:   . Feeling of Stress :   Social Connections:   . Frequency of Communication with Friends and Family:   . Frequency of Social Gatherings with Friends and Family:   . Attends Religious Services:   . Active Member of Clubs or Organizations:   . Attends Archivist Meetings:   Marland Kitchen Marital Status:   Intimate Partner Violence:   . Fear of Current or Ex-Partner:   . Emotionally Abused:   Marland Kitchen Physically Abused:   . Sexually Abused:     Current Meds  Medication Sig  . Calcium-Vitamin D 600-200 MG-UNIT per tablet Take 1 tablet by mouth 2 (two) times daily.    . Cholecalciferol 125 MCG (5000 UT) TABS Take 1 tablet by mouth daily.   . pantoprazole (PROTONIX) 40 MG tablet Take 1 tablet (40 mg total) by mouth daily.   No Known Allergies No results found for this or any previous visit (from the past 2160 hour(s)). Objective  Body mass index is 28.79 kg/m. Wt Readings from Last 3 Encounters:  08/10/20 178 lb 6.4 oz (80.9 kg)  01/06/20 189 lb (85.7 kg)  08/10/19 198 lb (89.8 kg)   Temp Readings from Last 3 Encounters:  08/10/20 98 F (36.7 C) (Oral)  01/06/19 (!) 97.5 F (36.4 C) (Oral)  10/21/18 98.5 F (36.9 C) (Oral)   BP Readings from Last 3 Encounters:  08/10/20 136/80  01/06/20 (!) 161/95  08/10/19 (!) 144/80   Pulse Readings from Last 3 Encounters:  08/10/20 (!) 59  08/10/19 (!) 58  01/26/19 80    Physical Exam Vitals reviewed.  Constitutional:      Appearance: Normal appearance. She is well-developed, well-groomed and overweight.  HENT:     Head: Normocephalic and atraumatic.  Eyes:     Conjunctiva/sclera: Conjunctivae normal.     Pupils: Pupils are equal, round, and reactive to light.  Cardiovascular:     Rate and Rhythm: Regular rhythm. Bradycardia present.     Heart sounds: Normal heart sounds. No murmur heard.   Pulmonary:     Effort: Pulmonary effort is normal.     Breath sounds: Normal breath sounds.  Abdominal:     General: Abdomen is flat.     Tenderness: There is no abdominal tenderness.  Skin:    General: Skin is warm and moist.  Neurological:     General: No focal deficit present.     Mental Status: She is alert and oriented to person, place, and time. Mental status is at baseline.     Gait: Gait normal.  Psychiatric:        Attention and Perception: Attention and perception normal.        Mood and Affect: Mood and affect normal.        Speech: Speech normal.         Behavior: Behavior normal. Behavior  is cooperative.        Thought Content: Thought content normal.        Cognition and Memory: Cognition and memory normal.        Judgment: Judgment normal.     Assessment  Plan  Essential tremor likely, headaches neck pain- Plan: clonazePAM (KLONOPIN) 0.25-0.5 MG tablet As of 05/02/21 pt wants to see Neurology Dr. Manuella Ghazi for sx's Numbness of left hand - Plan: B12 Neck Xray [facility] OP  Elevated blood pressure reading - Plan: Comprehensive metabolic panel, CBC with Differential/Platelet, Lipid panel Stopped benicar hct 20-12.5  Will monitor BP  Vitamin D deficiency - Plan: VITAMIN D 25 Hydroxy (Vit-D Deficiency, Fractures)  Hyperlipidemia, unspecified hyperlipidemia type - Plan: Lipid panel  Overweight (BMI 25.0-29.9)  rec healthy diet and exercise  HM Flu shotnot had 2020/21 may get [facility] rec Tdap disc at pharmacy to get Tdap given Rx today Had zostervax, shingrix Rx prev not had  prevnar utd  consider pna 23 disc today 08/10/20 will wait  MMR immune  Pap LMP 1976 hysterectomy ovaries intact no h/o abnormal pap last pap 08/21/12 neg pap neg HPV Mammogram 06/14/20 normal   Consider repeat with DEXA  Colonoscopy 09/2018 tubular and hyperplastic polyps2 total polypsDr. Lilyan Gilford in 5 years DEXA h/o osteopenia 2012 referred repeatordered HCV neg 01/26/16  Skin nose ak right as of 08/10/20 rec f/u Dr. Evorn Gong tbse  GI Dr. Carlean Purl FH colon cancer mom died age 38 and h/o polyps  Lung Dr. Mortimer Fries CT scan chest due 01/2021   Eye patty vision seen 04/2020  Dentist Dr. Norman Herrlich   Provider: Dr. Olivia Mackie McLean-[facility]

## 2020-08-10 NOTE — Patient Instructions (Addendum)
Call back when ready pneumonia 23 vaccine  Consider Tdap vaccine at pharmacy  COVID-19 Vaccine Information can be found at: [email or web address] For questions related to vaccine distribution or appointments, please email [email or web address] or call [phone].    Chest ct due 01/2021   Goal BP <130/<80   Let me know if you want to see neurology    Debrox ear wax drops  Pneumococcal Polysaccharide Vaccine (PPSV23): What You Need to Know 1. Why get vaccinated? Pneumococcal polysaccharide vaccine (PPSV23) can prevent pneumococcal disease. Pneumococcal disease refers to any illness caused by pneumococcal bacteria. These bacteria can cause many types of illnesses, including pneumonia, which is an infection of the lungs. Pneumococcal bacteria are one of the most common causes of pneumonia. Besides pneumonia, pneumococcal bacteria can also cause:  Ear infections  Sinus infections  Meningitis (infection of the tissue covering the brain and spinal cord)  Bacteremia (bloodstream infection) Anyone can get pneumococcal disease, but children under 52 years of age, people with certain medical conditions, adults 58 years or older, and cigarette smokers are at the highest risk. Most pneumococcal infections are mild. However, some can result in long-term problems, such as brain damage or hearing loss. Meningitis, bacteremia, and pneumonia caused by pneumococcal disease can be fatal. 2. PPSV23 PPSV23 protects against 23 types of bacteria that cause pneumococcal disease. PPSV23 is recommended for:  All adults 17 years or older,  Anyone 2 years or older with certain medical conditions that can lead to an increased risk for pneumococcal disease. Most people need only one dose of PPSV23. A second dose of PPSV23, and another type of pneumococcal vaccine called PCV13, are recommended for certain high-risk groups. Your health care provider can  give you more information. People 65 years or older should get a dose of PPSV23 even if they have already gotten one or more doses of the vaccine before they turned 73. 3. Talk with your health care provider Tell your vaccine provider if the person getting the vaccine:  Has had an allergic reaction after a previous dose of PPSV23, or has any severe, life-threatening allergies. In some cases, your health care provider may decide to postpone PPSV23 vaccination to a future visit. People with minor illnesses, such as a cold, may be vaccinated. People who are moderately or severely ill should usually wait until they recover before getting PPSV23. Your health care provider can give you more information. 4. Risks of a vaccine reaction  Redness or pain where the shot is given, feeling tired, fever, or muscle aches can happen after PPSV23. People sometimes faint after medical procedures, including vaccination. Tell your provider if you feel dizzy or have vision changes or ringing in the ears. As with any medicine, there is a very remote chance of a vaccine causing a severe allergic reaction, other serious injury, or death. 5. What if there is a serious problem? An allergic reaction could occur after the vaccinated person leaves the clinic. If you see signs of a severe allergic reaction (hives, swelling of the face and throat, difficulty breathing, a fast heartbeat, dizziness, or weakness), call 9-1-1 and get the person to the nearest hospital. For other signs that concern you, call your health care provider. Adverse reactions should be reported to the Vaccine Adverse Event Reporting System (VAERS). Your health care provider will usually file this report, or you can do it yourself. Visit the VAERS website at www.vaers.SamedayNews.es or call (602)443-2723. VAERS is only for reporting reactions, and VAERS staff do not give medical advice.  6. How can I learn more?  Ask your health care provider.  Call your local  or state health department.  Contact the Centers for Disease Control and Prevention (CDC): ? Call 210-428-4062 (1-800-CDC-INFO) or ? Visit CDC's website at http://hunter.com/ CDC Vaccine Information Statement PPSV23 Vaccine (10/22/2018) This information is not intended to replace advice given to you by your health care provider. Make sure you discuss any questions you have with your health care provider. Document Revised: 03/31/2019 Document Reviewed: 07/22/2018 Elsevier Patient Education  [place].    Varicose Veins Varicose veins are veins that have become enlarged, bulged, and twisted. They most often appear in the legs. What are the causes? This condition is caused by damage to the valves in the vein. These valves help blood return to your heart. When they are damaged and they stop working properly, blood may flow backward and back up in the veins near the skin, causing the veins to get larger and appear twisted. The condition can result from any issue that causes blood to back up, like pregnancy, prolonged standing, or obesity. What increases the risk? This condition is more likely to develop in people who are:  On their feet a lot.  Pregnant.  Overweight. What are the signs or symptoms? Symptoms of this condition include:  Bulging, twisted, and bluish veins.  A feeling of heaviness. This may be worse at the end of the day.  Leg pain. This may be worse at the end of the day.  Swelling in the leg.  Changes in skin color over the veins. How is this diagnosed? This condition may be diagnosed based on your symptoms, a physical exam, and an ultrasound test. How is this treated? Treatment for this condition may involve:  Avoiding sitting or standing in one position for long periods of time.  Wearing compression stockings. These stockings help to prevent blood clots and reduce swelling in the legs.  Raising (elevating) the legs when resting.  Losing  weight.  Exercising regularly. If you have persistent symptoms or want to improve the way your varicose veins look, you may choose to have a procedure to close the varicose veins off or to remove them. Treatments to close off the veins include:  Sclerotherapy. In this treatment, a solution is injected into a vein to close it off.  Laser treatment. In this treatment, the vein is heated with a laser to close it off.  Radiofrequency vein ablation. In this treatment, an electrical current produced by radio waves is used to close off the vein. Treatments to remove the veins include:  Phlebectomy. In this treatment, the veins are removed through small incisions made over the veins.  Vein ligation and stripping. In this treatment, incisions are made over the veins. The veins are then removed after being tied (ligated) with stitches (sutures). Follow these instructions at home: Activity  Walk as much as possible. Walking increases blood flow. This helps blood return to the heart and takes pressure off your veins. It also increases your cardiovascular strength.  Follow your health care provider's instructions about exercising.  Do not stand or sit in one position for a long period of time.  Do not sit with your legs crossed.  Rest with your legs raised during the day. General instructions   Follow any diet instructions given to you by your health care provider.  Wear compression stockings as directed by your health care provider. Do not wear other kinds of tight clothing around your legs,  pelvis, or waist.  Elevate your legs at night to above the level of your heart.  If you get a cut in the skin over the varicose vein and the vein bleeds: ? Lie down with your leg raised. ? Apply firm pressure to the cut with a clean cloth until the bleeding stops. ? Place a bandage (dressing) on the cut. Contact a health care provider if:  The skin around your varicose veins starts to break  down.  You have pain, redness, tenderness, or hard swelling over a vein.  You are uncomfortable because of pain.  You get a cut in the skin over a varicose vein and it will not stop bleeding. Summary  Varicose veins are veins that have become enlarged, bulged, and twisted. They most often appear in the legs.  This condition is caused by damage to the valves in the vein. These valves help blood return to your heart.  Treatment for this condition includes frequent movements, wearing compression stockings, losing weight, and exercising regularly. In some cases, procedures are done to close off or remove the veins.  Treatment for this condition may include wearing compression stockings, elevating the legs, losing weight, and engaging in regular activity. In some cases, procedures are done to close off or remove the veins. This information is not intended to replace advice given to you by your health care provider. Make sure you discuss any questions you have with your health care provider. Document Revised: 02/05/2019 Document Reviewed: 01/02/2017 Elsevier Patient Education  [place].    Essential Tremor A tremor is trembling or shaking that a person cannot control. Most tremors affect the hands or arms. Tremors can also affect the head, vocal cords, legs, and other parts of the body. Essential tremor is a tremor without a known cause. Usually, it occurs while a person is trying to perform an action. It tends to get worse gradually as a person ages. What are the causes? The cause of this condition is not known. What increases the risk? You are more likely to develop this condition if:  You have a family member with essential tremor.  You are age 16 or older.  You take certain medicines. What are the signs or symptoms? The main sign of a tremor is a rhythmic shaking of certain parts of your body that is uncontrolled and unintentional. You may:  Have difficulty eating with a  spoon or fork.  Have difficulty writing.  Nod your head up and down or side to side.  Have a quivering voice. The shaking may:  Get worse over time.  Come and go.  Be more noticeable on one side of your body.  Get worse due to stress, fatigue, caffeine, and extreme heat or cold. How is this diagnosed? This condition may be diagnosed based on:  Your symptoms and medical history.  A physical exam. There is no single test to diagnose an essential tremor. However, your health care provider may order tests to rule out other causes of your condition. These may include:  Blood and urine tests.  Imaging studies of your brain, such as CT scan and MRI.  A test that measures involuntary muscle movement (electromyogram). How is this treated? Treatment for essential tremor depends on the severity of the condition.  Some tremors may go away without treatment.  Mild tremors may not need treatment if they do not affect your day-to-day life.  Severe tremors may need to be treated using one or more of the  following options: ? Medicines. ? Lifestyle changes. ? Occupational or physical therapy. Follow these instructions at home: Lifestyle   Do not use any products that contain nicotine or tobacco, such as cigarettes and e-cigarettes. If you need help quitting, ask your health care provider.  Limit your caffeine intake as told by your health care provider.  Try to get 8 hours of sleep each night.  Find ways to manage your stress that fits your lifestyle and personality. Consider trying meditation or yoga.  Try to anticipate stressful situations and allow extra time to manage them.  If you are struggling emotionally with the effects of your tremor, consider working with a mental health provider. General instructions  Take over-the-counter and prescription medicines only as told by your health care provider.  Avoid extreme heat and extreme cold.  Keep all follow-up visits as  told by your health care provider. This is important. Visits may include physical therapy visits. Contact a health care provider if:  You experience any changes in the location or intensity of your tremors.  You start having a tremor after starting a new medicine.  You have tremor with other symptoms, such as: ? Numbness. ? Tingling. ? Pain. ? Weakness.  Your tremor gets worse.  Your tremor interferes with your daily life.  You feel down, blue, or sad for at least 2 weeks in a row.  Worrying about your tremor and what other people think about you interferes with your everyday life functions, including relationships, work, or school. Summary  Essential tremor is a tremor without a known cause. Usually, it occurs when you are trying to perform an action.  The cause of this condition is not known.  The main sign of a tremor is a rhythmic shaking of certain parts of your body that is uncontrolled and unintentional.  Treatment for essential tremor depends on the severity of the condition. This information is not intended to replace advice given to you by your health care provider. Make sure you discuss any questions you have with your health care provider. Document Revised: 12/20/2017 Document Reviewed: 12/20/2017 Elsevier Patient Education  2020 [employer].

## 2020-08-11 LAB — IRON,TIBC AND FERRITIN PANEL
Ferritin: 88 ng/mL (ref 15–150)
Iron Saturation: 29 % (ref 15–55)
Iron: 99 ug/dL (ref 27–139)
Total Iron Binding Capacity: 345 ug/dL (ref 250–450)
UIBC: 246 ug/dL (ref 118–369)

## 2021-01-06 ENCOUNTER — Other Ambulatory Visit: Payer: Self-pay | Admitting: Internal Medicine

## 2021-01-06 ENCOUNTER — Encounter: Payer: Self-pay | Admitting: Internal Medicine

## 2021-01-06 MED ORDER — PANTOPRAZOLE SODIUM 40 MG PO TBEC: 40.0000 mg | DELAYED_RELEASE_TABLET | Freq: Every day | ORAL | 3 refills | Status: DC

## 2021-01-17 DIAGNOSIS — L570 Actinic keratosis: Secondary | ICD-10-CM | POA: Diagnosis not present

## 2021-01-17 DIAGNOSIS — D2262 Melanocytic nevi of left upper limb, including shoulder: Secondary | ICD-10-CM | POA: Diagnosis not present

## 2021-01-17 DIAGNOSIS — D2272 Melanocytic nevi of left lower limb, including hip: Secondary | ICD-10-CM | POA: Diagnosis not present

## 2021-01-17 DIAGNOSIS — D225 Melanocytic nevi of trunk: Secondary | ICD-10-CM | POA: Diagnosis not present

## 2021-01-17 DIAGNOSIS — D2271 Melanocytic nevi of right lower limb, including hip: Secondary | ICD-10-CM | POA: Diagnosis not present

## 2021-01-17 DIAGNOSIS — D2261 Melanocytic nevi of right upper limb, including shoulder: Secondary | ICD-10-CM | POA: Diagnosis not present

## 2021-01-17 DIAGNOSIS — L821 Other seborrheic keratosis: Secondary | ICD-10-CM | POA: Diagnosis not present

## 2021-02-06 ENCOUNTER — Ambulatory Visit
Admission: RE | Admit: 2021-02-06 | Discharge: 2021-02-06 | Disposition: A | Discharge status: Home / Self Care | Payer: Medicare Other | Source: Ambulatory Visit | Attending: Acute Care | Admitting: Acute Care

## 2021-02-06 ENCOUNTER — Other Ambulatory Visit: Payer: Self-pay

## 2021-02-06 DIAGNOSIS — R918 Other nonspecific abnormal finding of lung field: Secondary | ICD-10-CM | POA: Diagnosis not present

## 2021-02-06 DIAGNOSIS — I2510 Atherosclerotic heart disease of native coronary artery without angina pectoris: Secondary | ICD-10-CM | POA: Diagnosis not present

## 2021-02-06 DIAGNOSIS — I700 Atherosclerosis of aorta: Secondary | ICD-10-CM | POA: Diagnosis not present

## 2021-02-06 DIAGNOSIS — J984 Other disorders of lung: Secondary | ICD-10-CM | POA: Diagnosis not present

## 2021-02-08 NOTE — Progress Notes (Signed)
@Patient  ID: Claire Thomas, female    DOB: Nov 02, 1951, 70 y.o.   MRN: [id number]  Chief Complaint  Patient presents with  . Follow-up    CT 02/06/21--c/o sob with exertion. Unable to wear cpap nightly due to caring for her father.    Referring provider: McLean-Scocuzza, Olivia Mackie *  HPI:  70 year old female seen in our office for Multiple lung nodules, OSA, Centrilobular emphysema, asthma, allergic rhinitis  PMH: GERD, HTN, migraine, essential tremor, CAD, HLD Smoker/ Smoking History: Never  Maintenance: None  Pt of: Dr. Mortimer Fries  02/09/2021  - Visit   71 year old female seen in our office for Multiple lung nodules, OSA, Centrilobular emphysema, asthma, allergic rhinitis. Last seen in our office was 02/15/20 via telemed, via the note was due to follow up with Dr. Mortimer Fries in 77mths. Recommendations were CT Chest without Contrast for 01/2021 which is a 12 month follow up of pulmonary nodules.  She was to have her DME company set up CPAP machine at her elderly father's house who has Alzheimer's as she had been staying there 5 nights a week to take care of him. Continue current settings of 5-10 cm H2O in Auto Set mode.   Presents today for 12 month CT follow up. CT was done on 02/06/2021. See report below under imaging.   CPAP Compliance Report: 01/08/2021-02/06/2021 Usage days: 4/30 Usage >4 hours 2 days Average usage: 5 hours 0 minutes AutoSet 5-10 cmH2O 95th percentile pressure- cmH2O:  10 AHI: 0.6  She currently is staying with father 6 nights a week. Father only sleeps about 15 minutes nightly and remains very active throughout the day and night. Her sleep is limited, she sleeps in recliner nightly for few minutes at a time. She did have her old CPAP machine taken to father's house. The DME company did set old CPAP to her current settings. When she is at her home she uses newest CPAP machine. Will address this today.    PFT 10/2016  shows NO obstruction and no restriction DLCO WNL Ratio  81% Fev1 2.2L 93% predicted FVC 2.9L 88% predicted    Tests:   FENO:  No results found for: NITRICOXIDE  PFT: PFT Results Latest Ref Rng & Units 11/19/2016  FVC-Pre L 2.62  FVC-Predicted Pre % 88  FVC-Post L 2.74  FVC-Predicted Post % 92  Pre FEV1/FVC % % 81  Post FEV1/FCV % % 81  FEV1-Pre L 2.13  FEV1-Predicted Pre % 93  FEV1-Post L 2.22  DLCO uncorrected ml/min/mmHg 25.62  DLCO UNC% % 115  DLVA Predicted % 86  TLC L 5.43  TLC % Predicted % 112  RV % Predicted % 127    WALK:  SIX MIN WALK 11/19/2016  Medications none  Supplimental Oxygen during Test? (L/min) No  Laps 8  Partial Lap (in Meters) 17  Baseline BP (sitting) 142/90  Baseline Heartrate 74  Baseline Dyspnea (Borg Scale) 0  Baseline Fatigue (Borg Scale) 1  Baseline SPO2 95  BP (sitting) 158/98  Heartrate 89  Dyspnea (Borg Scale) 4  Fatigue (Borg Scale) 4  SPO2 98  BP (sitting) 150/94  Heartrate 77  SPO2 98  Stopped or Paused before Six Minutes No  Distance Completed 401  Tech Comments: Pt walked at moderate pace.    Imaging: CT CHEST WO CONTRAST  Result Date: 02/06/2021 CLINICAL DATA:  Lung nodule. EXAM: CT CHEST WITHOUT CONTRAST TECHNIQUE: Multidetector CT imaging of the chest was performed following the standard protocol without IV  contrast. COMPARISON:  02/02/2020, 01/21/2019 and 08/14/2016. FINDINGS: Cardiovascular: Atherosclerotic calcification of the aorta and coronary arteries. Heart size normal. No pericardial effusion. Mediastinum/Nodes: No pathologically enlarged mediastinal or axillary lymph nodes. Hilar regions are difficult to definitively evaluate without IV contrast. Esophagus is grossly unremarkable. Lungs/Pleura: 8 mm (6 x 10 mm, 3/41) peripheral right upper lobe ground-glass nodule is stable from 08/14/2016. Additional scarring in the right upper lobe. Scattered subpleural lymph nodes along the fissures. Scarring in the right middle lobe, lingula and both lower lobes. Scattered solid  pulmonary nodules measure up to 6 mm in the right lower lobe (3/116), unchanged from 08/14/2016 and benign. No pleural fluid. Airway is unremarkable. Upper Abdomen: Subcentimeter low-attenuation lesion in the dome of the liver is too small to characterize. Visualized portions of the liver, gallbladder and right adrenal gland are unremarkable. Thickening of the left adrenal gland. Visualized portions of the kidneys, spleen, pancreas, stomach and bowel are grossly unremarkable. Musculoskeletal: Degenerative changes in the spine. No worrisome lytic or sclerotic lesions. IMPRESSION: 1. 8 mm ground-glass nodule in the right upper lobe and scattered solid pulmonary nodules are unchanged from 08/14/2016 and considered benign. 2. Aortic atherosclerosis (ICD10-I70.0). Coronary artery calcification. Electronically Signed   By: Lorin Picket M.D.   On: 02/06/2021 09:43    Lab Results:  CBC    Component Value Date/Time   WBC 7.4 08/10/2020 1035   RBC 4.47 08/10/2020 1035   HGB 14.1 08/10/2020 1035   HGB 12.9 07/15/2014 0900   HCT 41.5 08/10/2020 1035   HCT 38.3 07/15/2014 0900   PLT 309.0 08/10/2020 1035   PLT 278 07/15/2014 0900   MCV 93.0 08/10/2020 1035   MCV 94 07/15/2014 0900   MCH 31.5 07/15/2014 0900   MCHC 33.9 08/10/2020 1035   RDW 13.4 08/10/2020 1035   RDW 12.5 07/15/2014 0900   LYMPHSABS 1.8 08/10/2020 1035   LYMPHSABS 2.0 07/15/2014 0900   MONOABS 0.5 08/10/2020 1035   MONOABS 0.5 07/15/2014 0900   EOSABS 0.1 08/10/2020 1035   EOSABS 0.0 07/15/2014 0900   BASOSABS 0.1 08/10/2020 1035   BASOSABS 0.0 07/15/2014 0900    BMET    Component Value Date/Time   NA 140 08/10/2020 1035   NA 138 07/15/2014 0900   K 4.3 08/10/2020 1035   K 4.0 07/15/2014 0900   CL 100 08/10/2020 1035   CL 101 07/15/2014 0900   CO2 31 08/10/2020 1035   CO2 32 07/15/2014 0900   GLUCOSE 97 08/10/2020 1035   GLUCOSE 95 07/15/2014 0900   BUN 15 08/10/2020 1035   BUN 19 (H) 07/15/2014 0900   CREATININE  0.89 08/10/2020 1035   CREATININE 1.05 07/15/2014 0900   CALCIUM 10.0 08/10/2020 1035   CALCIUM 8.9 07/15/2014 0900   GFRNONAA 57 (L) 07/15/2014 0900   GFRAA >60 07/15/2014 0900    BNP No results found for: BNP  ProBNP    Component Value Date/Time   PROBNP 25.0 03/04/2014 1249    Specialty Problems      Pulmonary Problems   ALLERGIC RHINITIS DUE TO OTHER ALLERGEN    Qualifier: Diagnosis of  By: Diona Browner MD, Amy        Pulmonary nodules    F/u with Dr. Mortimer Fries CT chest had 01/13/18        Asthma   Centrilobular emphysema (HCC)   OSA (obstructive sleep apnea)      No Known Allergies  Immunization History  Administered Date(s) Administered  . Influenza Whole 10/27/2009, 12/30/2012  .  Influenza, High Dose Seasonal PF 10/21/2018  . Influenza,inj,Quad PF,6+ Mos 11/25/2013  . Pneumococcal Conjugate-13 01/06/2019  . Td 12/24/2000, 03/06/2011  . Tdap 03/09/2010  . Zoster 12/30/2012    Past Medical History:  Diagnosis Date  . Arthritis   . Asthma    worse with exertion   . Chicken pox   . Colon polyps   . Frequent headaches   . GERD (gastroesophageal reflux disease)   . IBS (irritable bowel syndrome)    diarrhea type  . Nonobstructive CAD (coronary artery disease)    a. 06/2014 St echo: ex 8:13 (10.1 mets), moderate apical ant/mid anteroseptal HK; b. 06/2014 Cath: mild luminal irregularities, otw nl cors. EF 60%.  . Osteopenia   . Pulmonary nodules   . Sleep apnea    wears cpap but no 02     Tobacco History: Social History   Tobacco Use  Smoking Status Never Smoker  Smokeless Tobacco Never Used   Counseling given: Not Answered   Continue to not smoke  Outpatient Encounter Medications as of 02/09/2021  Medication Sig  . albuterol (PROVENTIL HFA;VENTOLIN HFA) 108 (90 Base) MCG/ACT inhaler Inhale 2 puffs into the lungs every 4 (four) hours as needed for wheezing or shortness of breath.  . Cholecalciferol 125 MCG (5000 UT) TABS Take 5 tablets by mouth  daily.  . clonazePAM (KLONOPIN) 0.5 MG tablet Take 0.5-1 tablets (0.25-0.5 mg total) by mouth at bedtime as needed for anxiety.  . pantoprazole (PROTONIX) 40 MG tablet Take 1 tablet (40 mg total) by mouth daily.  . [DISCONTINUED] Calcium-Vitamin D 600-200 MG-UNIT per tablet Take 1 tablet by mouth 2 (two) times daily.   (Patient not taking: Reported on 02/09/2021)   No facility-administered encounter medications on file as of 02/09/2021.     Review of Systems  Review of Systems  Constitutional: Positive for fatigue (lack of sleep taking care of father).  HENT: Negative.   Respiratory: Negative.   Cardiovascular: Negative.   Skin: Negative.   Psychiatric/Behavioral: Positive for dysphoric mood (stress from caring for father).     Physical Exam  BP (!) 152/88 (BP Location: Left Arm, Cuff Size: Normal)   Pulse 64   Temp (!) 97 F (36.1 C) (Temporal)   Ht 5\' 6"  (1.676 m)   Wt 188 lb (85.3 kg)   SpO2 99%   BMI 30.34 kg/m   Wt Readings from Last 5 Encounters:  02/09/21 188 lb (85.3 kg)  02/09/21 190 lb (86.2 kg)  08/10/20 178 lb 6.4 oz (80.9 kg)  01/06/20 189 lb (85.7 kg)  08/10/19 198 lb (89.8 kg)    BMI Readings from Last 5 Encounters:  02/09/21 30.34 kg/m  02/09/21 30.67 kg/m  08/10/20 28.79 kg/m  01/06/20 30.51 kg/m  08/10/19 31.96 kg/m     Physical Exam Vitals reviewed.  Constitutional:      Appearance: Normal appearance.  HENT:     Head: Normocephalic.     Right Ear: External ear normal.     Left Ear: External ear normal.     Nose: Nose normal.     Mouth/Throat:     Mouth: Mucous membranes are moist.  Cardiovascular:     Rate and Rhythm: Normal rate and regular rhythm.     Pulses: Normal pulses.     Heart sounds: Normal heart sounds.  Pulmonary:     Effort: Pulmonary effort is normal.     Breath sounds: Normal breath sounds.  Abdominal:     Palpations: Abdomen is soft.  Musculoskeletal:     Cervical back: Normal range of motion.  Skin:     General: Skin is warm and dry.  Neurological:     General: No focal deficit present.     Mental Status: She is alert and oriented to person, place, and time.  Psychiatric:        Mood and Affect: Mood normal.        Behavior: Behavior normal.        Thought Content: Thought content normal.       Assessment & Plan:   OSA (obstructive sleep apnea) Using old CPAP that is not reportable at father's house who has Alzheimer's. Stays with him 6 nights weekly. DME company set old machine to current settings for her use at fathers.    Plan:  Switch CPAP machines. Take newest machine to father's house, use when you can   Pulmonary nodules CT completed 02/06/21. No changes from 2017 CT.    Plan:  Follow up with Dr.Kasa in 6 months  At risk for caregiver role strain Currently stays with father 6 nights a week. Father diagnosed with Alzheimer's dementia. She states he only sleeps about 15 minutes nightly so she sleeps when she can in a recliner.   Plan: Encouraged her to reach out to father's PCP to see if he's eligible for caregiver assistance.     Return in about 6 months (around 08/09/2021), or if symptoms worsen or fail to improve, for [facility] - Dr. Mortimer Fries.   Tula Nakayama, RN 02/09/2021   This appointment required 34 minutes of patient care (this includes precharting, chart review, review of results, face-to-face care, etc.).

## 2021-02-09 ENCOUNTER — Ambulatory Visit (INDEPENDENT_AMBULATORY_CARE_PROVIDER_SITE_OTHER): Payer: Medicare Other | Admitting: Nurse Practitioner

## 2021-02-09 ENCOUNTER — Encounter: Payer: Self-pay | Admitting: Nurse Practitioner

## 2021-02-09 ENCOUNTER — Ambulatory Visit (INDEPENDENT_AMBULATORY_CARE_PROVIDER_SITE_OTHER): Payer: Medicare Other | Admitting: Pulmonary Disease

## 2021-02-09 ENCOUNTER — Encounter: Payer: Self-pay | Admitting: Pulmonary Disease

## 2021-02-09 ENCOUNTER — Other Ambulatory Visit: Payer: Self-pay

## 2021-02-09 VITALS — BP 152/88 | HR 64 | Temp 97.0°F | Ht 66.0 in | Wt 188.0 lb

## 2021-02-09 VITALS — BP 148/90 | HR 60 | Ht 66.0 in | Wt 190.0 lb

## 2021-02-09 DIAGNOSIS — G4733 Obstructive sleep apnea (adult) (pediatric): Secondary | ICD-10-CM | POA: Diagnosis not present

## 2021-02-09 DIAGNOSIS — Z9189 Other specified personal risk factors, not elsewhere classified: Secondary | ICD-10-CM | POA: Insufficient documentation

## 2021-02-09 DIAGNOSIS — R030 Elevated blood-pressure reading, without diagnosis of hypertension: Secondary | ICD-10-CM

## 2021-02-09 DIAGNOSIS — E782 Mixed hyperlipidemia: Secondary | ICD-10-CM

## 2021-02-09 DIAGNOSIS — R918 Other nonspecific abnormal finding of lung field: Secondary | ICD-10-CM

## 2021-02-09 DIAGNOSIS — I2510 Atherosclerotic heart disease of native coronary artery without angina pectoris: Secondary | ICD-10-CM

## 2021-02-09 HISTORY — DX: Other specified personal risk factors, not elsewhere classified: Z91.89

## 2021-02-09 NOTE — Patient Instructions (Addendum)
You were seen today by Lauraine Rinne, NP  for:   1. OSA (obstructive sleep apnea)  Continue to utilize your CPAP to the best your abilities given the caregiving responsibilities that you currently have  Please bring your new CPAP to your father's house that way we can have more documented readings of you attempting to use it for insurance purposes  We recommend that you continue using your CPAP daily >>>Keep up the hard work using your device >>> Goal should be wearing this for the entire night that you are sleeping, at least 4 to 6 hours  Remember:  . Do not drive or operate heavy machinery if tired or drowsy.  . Please notify the supply company and office if you are unable to use your device regularly due to missing supplies or machine being broken.  . Work on maintaining a healthy weight and following your recommended nutrition plan  . Maintain proper daily exercise and movement  . Maintaining proper use of your device can also help improve management of other chronic illnesses such as: Blood pressure, blood sugars, and weight management.   BiPAP/ CPAP Cleaning:  >>>Clean weekly, with Dawn soap, and bottle brush.  Set up to air dry. >>> Wipe mask out daily with wet wipe or towelette   2. Pulmonary nodules  Stable CT in February/2022  Radiology finding the pulmonary nodule benign  Do not believe we need additional CT imaging at this time  3. At risk for caregiver role strain  Discussed with your father's primary care whether or not they would refer for additional home health resources to relieve some of the caregiving responsibilities that she currently have  If you continue to have neck/cervical pain follow-up with primary care  Follow Up:    Return in about 6 months (around 08/09/2021), or if symptoms worsen or fail to improve, for [facility] - Dr. Mortimer Fries.   Notification of test results are managed in the following manner: If there are  any recommendations or changes  to the  plan of care discussed in office today,  we will contact you and let you know what they are. If you do not hear from [place], then your results are normal and you can view them through your  MyChart account , or a letter will be sent to you. Thank you again for trusting [place] with your care  - Thank you, [facility]    It is flu season:   >>> Best ways to protect herself from the flu: Receive the yearly flu vaccine, practice good hand hygiene washing with soap and also using hand sanitizer when available, eat a nutritious meals, get adequate rest, hydrate appropriately       Please contact the office if your symptoms worsen or you have concerns that you are not improving.   Thank you for choosing [facility] for your healthcare, and for allowing [place] to partner with you on your healthcare journey. I am thankful to be able to provide care to you today.   Wyn Quaker FNP-C

## 2021-02-09 NOTE — Assessment & Plan Note (Signed)
Using old CPAP that is not reportable at father's house who has Alzheimer's. Stays with him 6 nights weekly. DME company set old machine to current settings for her use at fathers.    Plan:  Switch CPAP machines. Take newest machine to father's house, use when you can

## 2021-02-09 NOTE — Patient Instructions (Signed)
Please monitor your blood pressure and heart rate daily. Below are some helpful tips.   Medication Instructions:  Your physician recommends that you continue on your current medications as directed. Please refer to the Current Medication list given to you today.  *If you need a refill on your cardiac medications before your next appointment, please call your pharmacy*  Follow-Up: At [facility], you and your health needs are our priority.  As part of our continuing mission to provide you with exceptional heart care, we have created designated Provider Care Teams.  These Care Teams include your primary Cardiologist (physician) and Advanced Practice Providers (APPs -  Physician Assistants and Nurse Practitioners) who all work together to provide you with the care you need, when you need it.  We recommend signing up for the patient portal called "MyChart".  Sign up information is provided on this After Visit Summary.  MyChart is used to connect with patients for Virtual Visits (Telemedicine).  Patients are able to view lab/test results, encounter notes, upcoming appointments, etc.  Non-urgent messages can be sent to your provider as well.   To learn more about what you can do with MyChart, go to [email or web address].    Your next appointment:   As needed with Dr Rockey Situ.  The format for your next appointment:   In Person  Provider:   You may see Ida Rogue, MD or one of the following Advanced Practice Providers on your designated Care Team:    Murray Hodgkins, NP  Christell Faith, PA-C  Marrianne Mood, PA-C  Cadence Kathlen Mody, [place]  Laurann Montana, NP   Tips to Measure your Blood Pressure Correctly  To determine whether you have hypertension, a medical professional will take a blood pressure reading. How you prepare for the test, the position of your arm, and other factors can change a blood pressure reading by 10% or more. That could be enough to hide high blood pressure, start  you on a drug you don't really need, or lead your doctor to incorrectly adjust your medications.  National and international guidelines offer specific instructions for measuring blood pressure. If a doctor, nurse, or medical assistant isn't doing it right, don't hesitate to ask him or her to get with the guidelines.  Here's what you can do to ensure a correct reading: . Don't drink a caffeinated beverage or smoke during the 30 minutes before the test. . Sit quietly for five minutes before the test begins. . During the measurement, sit in a chair with your feet on the floor and your arm supported so your elbow is at about heart level. . The inflatable part of the cuff should completely cover at least 80% of your upper arm, and the cuff should be placed on bare skin, not over a shirt. . Don't talk during the measurement. . Have your blood pressure measured twice, with a brief break in between. If the readings are different by 5 points or more, have it done a third time.  There are times to break these rules. If you sometimes feel lightheaded when getting out of bed in the morning or when you stand after sitting, you should have your blood pressure checked while seated and then while standing to see if it falls from one position to the next.  Because blood pressure varies throughout the day, your doctor will rarely diagnose hypertension on the basis of a single reading. Instead, he or she will want to confirm the measurements on at least two occasions, usually  within a few weeks of one another. The exception to this rule is if you have a blood pressure reading of 180/110 mm Hg or higher. A result this high usually calls for prompt treatment.  It's a good idea to have your blood pressure measured in both arms at least once, since the reading in one arm (usually the right) may be higher than that in the left. A 2014 study in The American Journal of Medicine of nearly 3,400 people found average arm- to-arm  differences in systolic blood pressure of about 5 points. The higher number should be used to make treatment decisions.  In 2017, new guidelines from the [employer], the [employer] of Cardiology, and nine other health organizations lowered the diagnosis of high blood pressure to 130/80 mm Hg or higher for all adults. The guidelines also redefined the various blood pressure categories to now include normal, elevated, Stage 1 hypertension, Stage 2 hypertension, and hypertensive crisis (see "Blood pressure categories").  Blood pressure categories  Blood pressure category SYSTOLIC (upper number)  DIASTOLIC (lower number)  Normal Less than 120 mm Hg and Less than 80 mm Hg  Elevated 120-129 mm Hg and Less than 80 mm Hg  High blood pressure: Stage 1 hypertension 130-139 mm Hg or 80-89 mm Hg  High blood pressure: Stage 2 hypertension 140 mm Hg or higher or 90 mm Hg or higher  Hypertensive crisis (consult your doctor immediately) Higher than 180 mm Hg and/or Higher than 120 mm Hg  Source: American Heart Association and American Stroke Association. For more on getting your blood pressure under control, buy Controlling Your Blood Pressure, a Special Health Report from [facility].   Blood Pressure Log   Date   Time  Blood Pressure  Position  Example: Nov 1 9 AM 124/78 sitting

## 2021-02-09 NOTE — Assessment & Plan Note (Signed)
Currently stays with father 6 nights a week. Father diagnosed with Alzheimer's dementia. She states he only sleeps about 15 minutes nightly so she sleeps when she can in a recliner.   Plan: Encouraged her to reach out to father's PCP to see if he's eligible for caregiver assistance.

## 2021-02-09 NOTE — Assessment & Plan Note (Signed)
CT completed 02/06/21. No changes from 2017 CT.    Plan:  Follow up with Dr.Kasa in 6 months

## 2021-02-09 NOTE — Progress Notes (Signed)
Office Visit    Patient Name: Claire Thomas Date of Encounter: 02/09/2021  Primary Care Provider:  McLean-Scocuzza, Nino Glow, MD Primary Cardiologist:  Ida Rogue, MD  Chief Complaint    70 y/o ? with a history of atypical chest pain and nonobstructive CAD on catheterization in 2015, hyperlipidemia, sleep apnea, GERD, COPD, obesity, and pulmonary nodules, who presents for follow-up related to elevated blood pressure.  Past Medical History    Past Medical History:  Diagnosis Date  . Arthritis   . Asthma    worse with exertion   . Chicken pox   . Colon polyps   . Frequent headaches   . GERD (gastroesophageal reflux disease)   . IBS (irritable bowel syndrome)    diarrhea type  . Nonobstructive CAD (coronary artery disease)    a. 06/2014 St echo: ex 8:13 (10.1 mets), moderate apical ant/mid anteroseptal HK; b. 06/2014 Cath: mild luminal irregularities, otw nl cors. EF 60%.  . Osteopenia   . Pulmonary nodules   . Sleep apnea    wears cpap but no 02    Past Surgical History:  Procedure Laterality Date  . ABDOMINAL HYSTERECTOMY     1976 DUB, pelvic pain ovaries intact   . CARDIAC CATHETERIZATION  07/19/2014   [facility]  . CARPAL TUNNEL RELEASE Bilateral   . COLONOSCOPY    . ESOPHAGOGASTRODUODENOSCOPY    . MENISCUS REPAIR Left   . PARTIAL HYSTERECTOMY    . UPPER GASTROINTESTINAL ENDOSCOPY      Allergies  No Known Allergies  History of Present Illness    70 year old female with the above past medical history including atypical chest pain, nonobstructive CAD, hyperlipidemia, sleep apnea, GERD, COPD, and pulmonary nodules.  She was previously evaluated for atypical chest pain in July 2015 with a stress echocardiogram which was abnormal in the setting of suboptimal imaging.  Moderate apical anterior and mid anteroseptal hypokinesis were noted on echo imaging.  This was followed by diagnostic catheterization which showed mild luminal irregularities and otherwise normal  coronary arteries with an EF of 60%.  In the setting of pulmonary nodules, she has annual surveillance chest CTs, which have shown a stable 8 mm groundglass nodule in the right upper lobe as well as scattered solid pulmonary nodules.  Aortic atherosclerosis and coronary artery calcification have also been noted (last CT February 06, 2021).  She was last seen in cardiology clinic in August 2020 at which time she reported chronic, stable dyspnea on exertion.  Exercise regimen and lipid management were recommended.  She preferred to avoid statin therapy and Zetia was recommended but cost prohibitive.  LDL in August 2021 was 101.  Since her last visit, she has been relatively stable from a cardiac standpoint.  She does not routinely exercise but believes she would not have any trouble walking for 30 minutes.  She does not experience any exertional chest pain or dyspnea but occasionally notes mild upper abdominal/epigastric discomfort that occurs a few times a week, at rest, can last up to 30 minutes, and resolves with Tums.  She is on a daily PPI.  Her 17 year old father lives with her and is suffering from dementia.  In this setting, she has significant disruption of sleep and finds it difficult to stick to any sort of exercise regimen as she is often fatigued.  She continues to work full-time from home as well.  She denies palpitations, PND, orthopnea, dizziness, syncope, edema, or early satiety.  Home Medications    Prior  to Admission medications   Medication Sig Start Date End Date Taking? Authorizing Provider  albuterol (PROVENTIL HFA;VENTOLIN HFA) 108 (90 Base) MCG/ACT inhaler Inhale 2 puffs into the lungs every 4 (four) hours as needed for wheezing or shortness of breath. Patient not taking: Reported on 08/10/2020 06/27/18   Flora Lipps, MD  Calcium-Vitamin D 600-200 MG-UNIT per tablet Take 1 tablet by mouth 2 (two) times daily.      [provider]  Cholecalciferol 125 MCG (5000 UT) TABS Take 1  tablet by mouth daily.     [provider]  clonazePAM (KLONOPIN) 0.5 MG tablet Take 0.5-1 tablets (0.25-0.5 mg total) by mouth at bedtime as needed for anxiety. 08/10/20   McLean-Scocuzza, Nino Glow, MD  pantoprazole (PROTONIX) 40 MG tablet Take 1 tablet (40 mg total) by mouth daily. 01/06/21   McLean-Scocuzza, Nino Glow, MD    Review of Systems    Occasional epigastric discomfort. She denies chest pain, palpitations, dyspnea, pnd, orthopnea, n, v, dizziness, syncope, edema, weight gain, or early satiety.  All other systems reviewed and are otherwise negative except as noted above.  Physical Exam    VS:  BP (!) 148/90 (BP Location: Left Arm, Patient Position: Sitting, Cuff Size: Normal)   Pulse 60   Ht 5\' 6"  (1.676 m)   Wt 190 lb (86.2 kg)   SpO2 99%   BMI 30.67 kg/m  , BMI Body mass index is 30.67 kg/m. GEN: Well nourished, well developed, in no acute distress. HEENT: normal. Neck: Supple, no JVD, carotid bruits, or masses. Cardiac: RRR, no murmurs, rubs, or gallops. No clubbing, cyanosis, edema.  Radials/PT 2+ and equal bilaterally.  Respiratory:  Respirations regular and unlabored, clear to auscultation bilaterally. GI: Soft, nontender, nondistended, BS + x 4. MS: no deformity or atrophy. Skin: warm and dry, no rash. Neuro:  Strength and sensation are intact. Psych: Normal affect.  Accessory Clinical Findings    ECG personally reviewed by me today -regular sinus rhythm, 60- no acute changes.  Lab Results  Component Value Date   WBC 7.4 08/10/2020   HGB 14.1 08/10/2020   HCT 41.5 08/10/2020   MCV 93.0 08/10/2020   PLT 309.0 08/10/2020   Lab Results  Component Value Date   CREATININE 0.89 08/10/2020   BUN 15 08/10/2020   NA 140 08/10/2020   K 4.3 08/10/2020   CL 100 08/10/2020   CO2 31 08/10/2020   Lab Results  Component Value Date   ALT 17 08/10/2020   AST 15 08/10/2020   ALKPHOS 81 08/10/2020   BILITOT 0.5 08/10/2020   Lab Results  Component Value  Date   CHOL 183 08/10/2020   HDL 53.20 08/10/2020   LDLCALC 101 (H) 08/10/2020   LDLDIRECT 143.0 03/06/2011   TRIG 144.0 08/10/2020   CHOLHDL 3 08/10/2020     Assessment & Plan    1.  Elevated blood pressure: Blood pressure today is 148/90.  Patient does check her blood pressure periodically at home and sometimes notes pressures in the 120s to 130s and other times in the 140s.  Pressure was 136/80 at primary care visit in August.  Recommended that she check her blood pressure once daily and keep a log.  She will contact us or primary care within the next few weeks with blood pressure numbers and understands that she may require treatment for hypertension.  2.  Nonobstructive coronary artery disease: Patient with prior history of atypical chest pain in minimal luminal irregularities on diagnostic catheterization July  2015.  She has had annual CT scans in the setting of pulmonary nodules which continue to show coronary calcium and aortic atherosclerosis.  She is not having any anginal symptoms.  We discussed the potential role of statin therapy.  LDL was 101 in August.  She prefers to avoid medical management at this time and will try and incorporate regular exercise into her daily routine.  We also discussed the importance of weight loss.  3.  Hyperlipidemia:  As above, LDL 101 in 07/2020.  In the setting of coronary calcium and 10-year risk of cardiovascular event at 12.4%, it would be reasonable to initiate statin therapy.  Patient prefers to attempt lifestyle modifications including dietary adjustments, exercise, and weight loss, and I have encouraged her to do so.  4.  Pulmonary nodules: Followed by pulmonology with stable nodules on recent CT.  5.  GERD:  On PPI.  Occasional breakthrough symptoms managed w/ TUMS.  6.  Disposition: Patient will follow up with [place] as needed.  She will contact either [place] or primary care within the next 2 weeks for blood pressure readings to assist [place] in  determining the appropriateness of antihypertensive medications.  Murray Hodgkins, NP 02/09/2021, 9:02 AM

## 2021-02-14 NOTE — Progress Notes (Signed)
Please call patient and let her know know her  CT Chest shows a stable nodule. This has been followed annually by Dr. Rich Number. He can determine if follow up is needed. Please make sure she has a follow up appointment in about a year so he can determine if he wants to continue to follow annually. Thanks.

## 2021-05-02 ENCOUNTER — Encounter: Payer: Self-pay | Admitting: Internal Medicine

## 2021-05-03 NOTE — Telephone Encounter (Signed)
Please advise, Patient last seen 08/10/20.  Does Patient need an appointment?

## 2021-05-08 NOTE — Telephone Encounter (Signed)
Patient called and would like to speak to [facility]. MyChart message is not clear to pt. Looks like no xray orders are placed.

## 2021-05-08 NOTE — Addendum Note (Signed)
Addended by: Orland Mustard on: 05/08/2021 02:35 AM   Modules accepted: Orders

## 2021-05-09 NOTE — Telephone Encounter (Signed)
Please advise, order of x-ray is for cervical spine.

## 2021-05-10 ENCOUNTER — Other Ambulatory Visit: Payer: Self-pay

## 2021-05-10 ENCOUNTER — Ambulatory Visit
Admission: RE | Admit: 2021-05-10 | Discharge: 2021-05-10 | Disposition: A | Discharge status: Home / Self Care | Payer: Medicare Other | Attending: Internal Medicine | Admitting: Internal Medicine

## 2021-05-10 ENCOUNTER — Ambulatory Visit
Admission: RE | Admit: 2021-05-10 | Discharge: 2021-05-10 | Disposition: A | Discharge status: Home / Self Care | Payer: Medicare Other | Source: Ambulatory Visit | Attending: Internal Medicine | Admitting: Internal Medicine

## 2021-05-10 DIAGNOSIS — M542 Cervicalgia: Secondary | ICD-10-CM | POA: Diagnosis not present

## 2021-05-15 ENCOUNTER — Telehealth: Payer: Self-pay | Admitting: Internal Medicine

## 2021-05-15 NOTE — Telephone Encounter (Signed)
Inform patient  Referred [facility] neck arthritis  [place] [facility] h/a, left hand tremors   These are 2 different issues to be addressed by 2 different specialists

## 2021-05-15 NOTE — Addendum Note (Signed)
Addended by: Orland Mustard on: 05/15/2021 11:31 AM   Modules accepted: Orders

## 2021-05-16 ENCOUNTER — Encounter: Payer: Self-pay | Admitting: Neurology

## 2021-05-16 NOTE — Telephone Encounter (Signed)
LMTCB

## 2021-05-16 NOTE — Telephone Encounter (Signed)
Pt was informed of message below.

## 2021-05-23 NOTE — Telephone Encounter (Signed)
Dr. Kasa, please advise.    

## 2021-05-25 ENCOUNTER — Other Ambulatory Visit: Payer: Self-pay

## 2021-05-25 ENCOUNTER — Telehealth: Payer: Self-pay | Admitting: Internal Medicine

## 2021-05-25 ENCOUNTER — Encounter: Payer: Self-pay | Admitting: Internal Medicine

## 2021-05-25 ENCOUNTER — Ambulatory Visit (INDEPENDENT_AMBULATORY_CARE_PROVIDER_SITE_OTHER): Payer: Medicare Other | Admitting: Internal Medicine

## 2021-05-25 VITALS — BP 126/82 | HR 67 | Temp 98.3°F | Ht 66.0 in | Wt 189.6 lb

## 2021-05-25 DIAGNOSIS — Z1231 Encounter for screening mammogram for malignant neoplasm of breast: Secondary | ICD-10-CM | POA: Diagnosis not present

## 2021-05-25 DIAGNOSIS — I10 Essential (primary) hypertension: Secondary | ICD-10-CM

## 2021-05-25 DIAGNOSIS — R1084 Generalized abdominal pain: Secondary | ICD-10-CM

## 2021-05-25 DIAGNOSIS — M542 Cervicalgia: Secondary | ICD-10-CM

## 2021-05-25 DIAGNOSIS — E559 Vitamin D deficiency, unspecified: Secondary | ICD-10-CM | POA: Diagnosis not present

## 2021-05-25 DIAGNOSIS — I6523 Occlusion and stenosis of bilateral carotid arteries: Secondary | ICD-10-CM

## 2021-05-25 DIAGNOSIS — E785 Hyperlipidemia, unspecified: Secondary | ICD-10-CM

## 2021-05-25 DIAGNOSIS — I2510 Atherosclerotic heart disease of native coronary artery without angina pectoris: Secondary | ICD-10-CM | POA: Diagnosis not present

## 2021-05-25 DIAGNOSIS — F5104 Psychophysiologic insomnia: Secondary | ICD-10-CM | POA: Diagnosis not present

## 2021-05-25 DIAGNOSIS — R0602 Shortness of breath: Secondary | ICD-10-CM | POA: Diagnosis not present

## 2021-05-25 DIAGNOSIS — R590 Localized enlarged lymph nodes: Secondary | ICD-10-CM | POA: Diagnosis not present

## 2021-05-25 DIAGNOSIS — M47812 Spondylosis without myelopathy or radiculopathy, cervical region: Secondary | ICD-10-CM

## 2021-05-25 DIAGNOSIS — R5383 Other fatigue: Secondary | ICD-10-CM | POA: Insufficient documentation

## 2021-05-25 HISTORY — DX: Shortness of breath: R06.02

## 2021-05-25 HISTORY — DX: Other fatigue: R53.83

## 2021-05-25 LAB — CBC WITH DIFFERENTIAL/PLATELET
Basophils Absolute: 0.1 10*3/uL (ref 0.0–0.1)
Basophils Relative: 1.3 % (ref 0.0–3.0)
Eosinophils Absolute: 0.1 10*3/uL (ref 0.0–0.7)
Eosinophils Relative: 1.1 % (ref 0.0–5.0)
HCT: 41.3 % (ref 36.0–46.0)
Hemoglobin: 13.9 g/dL (ref 12.0–15.0)
Lymphocytes Relative: 25.2 % (ref 12.0–46.0)
Lymphs Abs: 1.8 10*3/uL (ref 0.7–4.0)
MCHC: 33.8 g/dL (ref 30.0–36.0)
MCV: 94 fl (ref 78.0–100.0)
Monocytes Absolute: 0.5 10*3/uL (ref 0.1–1.0)
Monocytes Relative: 7.5 % (ref 3.0–12.0)
Neutro Abs: 4.6 10*3/uL (ref 1.4–7.7)
Neutrophils Relative %: 64.9 % (ref 43.0–77.0)
Platelets: 309 10*3/uL (ref 150.0–400.0)
RBC: 4.39 Mil/uL (ref 3.87–5.11)
RDW: 12.8 % (ref 11.5–15.5)
WBC: 7.1 10*3/uL (ref 4.0–10.5)

## 2021-05-25 LAB — TSH: TSH: 1.71 u[IU]/mL (ref 0.35–4.50)

## 2021-05-25 LAB — VITAMIN D 25 HYDROXY (VIT D DEFICIENCY, FRACTURES): VITD: 22.7 ng/mL — ABNORMAL LOW (ref 30.00–100.00)

## 2021-05-25 NOTE — Addendum Note (Signed)
Addended by: Orland Mustard on: 05/25/2021 01:25 PM   Modules accepted: Orders

## 2021-05-25 NOTE — Telephone Encounter (Signed)
-----   Message ----- From: Ludwig Clarks, DO Sent: 05/15/2021   3:11 PM EDT To: Virl Son  We cannot see her on all 4 diagnosis in one visit.  They referred for tremor, numbness, HA and neck pain.  That's too much for one visit.  We can put her with Dr. Tomi Likens since its a lot of varied dx but he can only address 2 diagnosis per visit.  msg from [facility] neurology

## 2021-05-25 NOTE — Patient Instructions (Addendum)
Essential Tremor A tremor is trembling or shaking that a person cannot control. Most tremors affect the hands or arms. Tremors can also affect the head, vocal cords, legs, and other parts of the body. Essential tremor is a tremor without a known cause. Usually, it occurs while a person is trying to perform an action. It tends to get worse gradually as a person ages. What are the causes? The cause of this condition is not known. What increases the risk? You are more likely to develop this condition if:  You have a family member with essential tremor.  You are age 40 or older.  You take certain medicines. What are the signs or symptoms? The main sign of a tremor is a rhythmic shaking of certain parts of your body that is uncontrolled and unintentional. You may:  Have difficulty eating with a spoon or fork.  Have difficulty writing.  Nod your head up and down or side to side.  Have a quivering voice. The shaking may:  Get worse over time.  Come and go.  Be more noticeable on one side of your body.  Get worse due to stress, fatigue, caffeine, and extreme heat or cold. How is this diagnosed? This condition may be diagnosed based on:  Your symptoms and medical history.  A physical exam. There is no single test to diagnose an essential tremor. However, your health care provider may order tests to rule out other causes of your condition. These may include:  Blood and urine tests.  Imaging studies of your brain, such as CT scan and MRI.  A test that measures involuntary muscle movement (electromyogram).   How is this treated? Treatment for essential tremor depends on the severity of the condition.  Some tremors may go away without treatment.  Mild tremors may not need treatment if they do not affect your day-to-day life.  Severe tremors may need to be treated using one or more of the following options: ? Medicines. ? Lifestyle changes. ? Occupational or physical  therapy. Follow these instructions at home: Lifestyle  Do not use any products that contain nicotine or tobacco, such as cigarettes and e-cigarettes. If you need help quitting, ask your health care provider.  Limit your caffeine intake as told by your health care provider.  Try to get 8 hours of sleep each night.  Find ways to manage your stress that fits your lifestyle and personality. Consider trying meditation or yoga.  Try to anticipate stressful situations and allow extra time to manage them.  If you are struggling emotionally with the effects of your tremor, consider working with a mental health provider.   General instructions  Take over-the-counter and prescription medicines only as told by your health care provider.  Avoid extreme heat and extreme cold.  Keep all follow-up visits as told by your health care provider. This is important. Visits may include physical therapy visits. Contact a health care provider if:  You experience any changes in the location or intensity of your tremors.  You start having a tremor after starting a new medicine.  You have tremor with other symptoms, such as: ? Numbness. ? Tingling. ? Pain. ? Weakness.  Your tremor gets worse.  Your tremor interferes with your daily life.  You feel down, blue, or sad for at least 2 weeks in a row.  Worrying about your tremor and what other people think about you interferes with your everyday life functions, including relationships, work, or school. Summary  Essential tremor   is a tremor without a known cause. Usually, it occurs when you are trying to perform an action.  You are more likely to develop this condition if you have a family member with essential tremor.  The main sign of a tremor is a rhythmic shaking of certain parts of your body that is uncontrolled and unintentional.  Treatment for essential tremor depends on the severity of the condition. This information is not intended to  replace advice given to you by your health care provider. Make sure you discuss any questions you have with your health care provider. Document Revised: 09/02/2020 Document Reviewed: 09/02/2020 Elsevier Patient Education  2021 [place] of Breath, Adult Shortness of breath is when a person has trouble breathing enough air or when a person feels like she or he is having trouble breathing in enough air. Shortness of breath could be a sign of a medical problem. Follow these instructions at home:  Pay attention to any changes in your symptoms.  Do not use any products that contain nicotine or tobacco, such as cigarettes, e-cigarettes, and chewing tobacco.  Do not smoke. Smoking is a common cause of shortness of breath. If you need help quitting, ask your health care provider.  Avoid things that can irritate your airways, such as: ? Mold. ? Dust. ? Air pollution. ? Chemical fumes. ? Things that can cause allergy symptoms (allergens), if you have allergies.  Keep your living space clean and free of mold and dust.  Rest as needed. Slowly return to your usual activities.  Take over-the-counter and prescription medicines only as told by your health care provider. This includes oxygen therapy and inhaled medicines.  Keep all follow-up visits as told by your health care provider. This is important.   Contact a health care provider if:  Your condition does not improve as soon as expected.  You have a hard time doing your normal activities, even after you rest.  You have new symptoms. Get help right away if:  Your shortness of breath gets worse.  You have shortness of breath when you are resting.  You feel light-headed or you faint.  You have a cough that is not controlled with medicines.  You cough up blood.  You have pain with breathing.  You have pain in your chest, arms, shoulders, or abdomen.  You have a fever.  You cannot walk up stairs or exercise the  way that you normally do. These symptoms may represent a serious problem that is an emergency. Do not wait to see if the symptoms will go away. Get medical help right away. Call your local emergency services (911 in the [place] Do not drive yourself to the hospital. Summary  Shortness of breath is when a person has trouble breathing enough air. It can be a sign of a medical problem.  Avoid things that irritate your lungs, such as smoking, pollution, mold, and dust.  Pay attention to changes in your symptoms and contact your health care provider if you have a hard time completing daily activities because of shortness of breath. This information is not intended to replace advice given to you by your health care provider. Make sure you discuss any questions you have with your health care provider. Document Revised: 05/12/2018 Document Reviewed: 05/12/2018 Elsevier Patient Education  2021 [employer].

## 2021-05-25 NOTE — Progress Notes (Signed)
Chief Complaint  Patient presents with  . Neck Pain  . Shortness of Breath  . Fatigue   F/u  1. C/o fatigue w/o wt loss and sleeping 4-5 hours taking care of dad  Unable to take klonopin 0.5 makes sleep more and needs to be alert with dad 2. C/o sob with exertion chronic but worsening w/in the last few months walking around store 2017 pfts negative obstruction and ct chest 02/06/21  H/o CAD on CT chest and BP at times elevated today 126/82 not on meds  She is est with pulm Dr. Mortimer Fries She wonders if has blockages in her carotid 3. Neck pain radiating up to back of head + cervical arthritis and needs to [facility] get sch  4. Essential tremor ? appt [facility] neurology 07/2021  5. C/l left lower neck pain   Review of Systems  Constitutional: Positive for malaise/fatigue. Negative for weight loss.  HENT: Negative for hearing loss.   Eyes: Negative for blurred vision.  Respiratory: Positive for shortness of breath.   Cardiovascular: Negative for chest pain.  Gastrointestinal: Negative for abdominal pain.  Musculoskeletal: Positive for neck pain.  Skin: Negative for rash.  Neurological: Positive for tremors and headaches.  Psychiatric/Behavioral: Negative for depression.   Past Medical History:  Diagnosis Date  . Arthritis   . Asthma    worse with exertion   . Chicken pox   . Colon polyps   . Frequent headaches   . GERD (gastroesophageal reflux disease)   . IBS (irritable bowel syndrome)    diarrhea type  . Nonobstructive CAD (coronary artery disease)    a. 06/2014 St echo: ex 8:13 (10.1 mets), moderate apical ant/mid anteroseptal HK; b. 06/2014 Cath: mild luminal irregularities, otw nl cors. EF 60%.  . Osteopenia   . Pulmonary nodules   . Sleep apnea    wears cpap but no 02    Past Surgical History:  Procedure Laterality Date  . ABDOMINAL HYSTERECTOMY     1976 DUB, pelvic pain ovaries intact   . CARDIAC CATHETERIZATION  07/19/2014   [facility]  . CARPAL TUNNEL RELEASE Bilateral    . COLONOSCOPY    . ESOPHAGOGASTRODUODENOSCOPY    . MENISCUS REPAIR Left   . PARTIAL HYSTERECTOMY    . UPPER GASTROINTESTINAL ENDOSCOPY     Family History  Problem Relation Age of Onset  . Colon cancer Mother   . Hyperlipidemia Mother   . Pulmonary fibrosis Mother   . Arthritis Mother   . Cancer Mother        colon   . Breast cancer Mother 55  . AAA (abdominal aortic aneurysm) Father   . Hyperlipidemia Father   . Hypertension Father   . Prostate cancer Father   . Cancer Father        prostate   . Dementia Father        41s as of 01/06/20  . Diabetes Paternal Grandmother   . Hyperlipidemia Brother   . Hypertension Brother   . Arthritis Son   . Heart disease Maternal Grandfather   . Asthma Paternal Grandfather   . Esophageal cancer Neg Hx   . Stomach cancer Neg Hx   . Rectal cancer Neg Hx   . Colon polyps Neg Hx    Social History   Socioeconomic History  . Marital status: Married    Spouse name: Not on file  . Number of children: 2  . Years of education: Not on file  . Highest education  level: Not on file  Occupational History  . Occupation: Glass blower/designer  Tobacco Use  . Smoking status: Never Smoker  . Smokeless tobacco: Never Used  Vaping Use  . Vaping Use: Never used  Substance and Sexual Activity  . Alcohol use: Yes    Alcohol/week: 0.0 standard drinks    Comment: occassioinal-wine every 6 months  . Drug use: No  . Sexual activity: Yes    Comment: men  Other Topics Concern  . 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 Cholecalciferol 125 MCG (5000 UT) TABS Take 5 tablets by mouth daily.  . pantoprazole (PROTONIX) 40 MG tablet Take 1 tablet (40 mg total) by mouth daily.   No Known Allergies No results found for this or any previous visit (from the past 2160 hour(s)). Objective  Body mass index is 30.6 kg/m. Wt Readings from Last 3 Encounters:  05/25/21 189 lb 9.6 oz (86 kg)  02/09/21 188 lb (85.3 kg)  02/09/21 190 lb (86.2 kg)   Temp Readings from Last 3 Encounters:  05/25/21 98.3 F (36.8 C) (Oral)  02/09/21 (!) 97 F (36.1 C) (Temporal)  08/10/20 98 F (36.7 C) (Oral)   BP Readings from Last 3 Encounters:  05/25/21 126/82  02/09/21 (!) 152/88  02/09/21 (!) 148/90   Pulse Readings from Last 3 Encounters:  05/25/21 67  02/09/21 64  02/09/21 60    Physical Exam Vitals and nursing note reviewed.  Constitutional:      Appearance: Normal appearance. She is well-developed and well-groomed. She is obese.  HENT:     Head: Normocephalic and atraumatic.  Eyes:     Conjunctiva/sclera: Conjunctivae normal.     Pupils: Pupils are equal, round, and reactive to light.  Cardiovascular:     Rate and Rhythm: Normal rate and regular rhythm.     Heart sounds: Normal heart sounds. No murmur heard.   Pulmonary:     Effort: Pulmonary effort is normal.     Breath sounds: Normal breath sounds.  Abdominal:     Tenderness: There is no abdominal tenderness.  Skin:    General: Skin is warm and dry.  Neurological:     General: No focal deficit present.     Mental Status: She is alert and oriented to person, place, and time. Mental status is at baseline.     Gait: Gait normal.  Psychiatric:        Attention and Perception: Attention and perception normal.        Mood and Affect: Mood and affect normal.        Speech: Speech normal.        Behavior: Behavior normal. Behavior is cooperative.        Thought Content: Thought content normal.        Cognition  and Memory: Cognition and memory normal.        Judgment: Judgment normal.  Assessment  Plan  Neck pain - Plan: CT Soft Tissue Neck W Contrast Left cervical lymphadenopathy - Plan: CT Soft   SOB (shortness of breath) on exertion - Plan: ECHOCARDIOGRAM COMPLETE F/u with cards  CAD Hypertension, bp improved today- Plan: ECHOCARDIOGRAM COMPLETE May need calcium score and stress test Dr.End   Fatigue, unspecified type - Plan: Comprehensive metabolic panel, CBC w/Diff, TSH, Urinalysis, Routine w reflex microscopic, CT Abdomen Pelvis Wo Contrast  Hyperlipidemia, unspecified hyperlipidemia type - Plan: Lipid panel  Vitamin D deficiency - Plan: Vitamin D (25 hydroxy)  Carotid artery calcification, bilateral - Plan: US Carotid Duplex Bilateral  Generalized abdominal pain - Plan: CT Abdomen Pelvis Wo Contrast  Chronic insomnia Stopped klonopin due to too sleepy 0.5   Cervical arthritis  [facility]t   ? ET F/u neurology [facility] 07/2021   HM Flu shotnot had 2020/21 may get [facility] rec Tdap disc at pharmacy to get Tdap given Rx prev Had zostervax, shingrix Rxprev not had prevnarutd  consider pna 23 disc today 08/10/20 will wait  MMR immune covid shot not had 05/25/21  Pap LMP 1976 hysterectomy ovaries intact no h/o abnormal pap last pap 08/21/12 neg pap neg HPV Mammogram6/22/21 normal, ordered   Consider repeat with DEXA ordered   Colonoscopy 09/2018 tubular and hyperplastic polyps2 total polypsDr. Lilyan Gilford in 5 years  DEXA h/o osteopenia 2012 referred repeatordered  HCV neg 01/26/16  Skin nose ak right as of 08/10/20 rec f/u Dr. Evorn Gong tbse  GI Dr. Carlean Purl FH colon cancer mom died age 14 and h/o polyps  Lung Dr. Mortimer Fries CT scan chest due 01/2021   Eye patty vision seen 04/2020  Dentist Dr. Norman Herrlich    Provider: Dr. Olivia Mackie McLean-Scocuzza-Internal Medicine

## 2021-05-26 ENCOUNTER — Encounter: Payer: Self-pay | Admitting: Internal Medicine

## 2021-05-26 LAB — URINALYSIS, ROUTINE W REFLEX MICROSCOPIC
Bacteria, UA: NONE SEEN /HPF
Bilirubin Urine: NEGATIVE
Glucose, UA: NEGATIVE
Hgb urine dipstick: NEGATIVE
Hyaline Cast: NONE SEEN /LPF
Ketones, ur: NEGATIVE
Nitrite: NEGATIVE
Protein, ur: NEGATIVE
RBC / HPF: NONE SEEN /HPF (ref 0–2)
Specific Gravity, Urine: 1.006 (ref 1.001–1.035)
pH: 7.5 (ref 5.0–8.0)

## 2021-05-26 LAB — COMPREHENSIVE METABOLIC PANEL
ALT: 16 U/L (ref 0–35)
AST: 15 U/L (ref 0–37)
Albumin: 4.6 g/dL (ref 3.5–5.2)
Alkaline Phosphatase: 90 U/L (ref 39–117)
BUN: 16 mg/dL (ref 6–23)
CO2: 28 mEq/L (ref 19–32)
Calcium: 9.8 mg/dL (ref 8.4–10.5)
Chloride: 102 mEq/L (ref 96–112)
Creatinine, Ser: 0.85 mg/dL (ref 0.40–1.20)
GFR: 69.71 mL/min (ref >60.00)
Glucose, Bld: 91 mg/dL (ref 70–99)
Potassium: 4.7 mEq/L (ref 3.5–5.1)
Sodium: 141 mEq/L (ref 135–145)
Total Bilirubin: 0.4 mg/dL (ref 0.2–1.2)
Total Protein: 7.4 g/dL (ref 6.0–8.3)

## 2021-05-26 LAB — LIPID PANEL
Cholesterol: 190 mg/dL (ref 0–200)
HDL: 50.4 mg/dL (ref >39.00)
LDL Cholesterol: 106 mg/dL — ABNORMAL HIGH (ref 0–99)
NonHDL: 139.95
Total CHOL/HDL Ratio: 4
Triglycerides: 171 mg/dL — ABNORMAL HIGH (ref 0.0–149.0)
VLDL: 34.2 mg/dL (ref 0.0–40.0)

## 2021-05-26 LAB — MICROSCOPIC MESSAGE

## 2021-05-29 ENCOUNTER — Encounter: Payer: Self-pay | Admitting: Internal Medicine

## 2021-05-30 ENCOUNTER — Other Ambulatory Visit: Payer: Self-pay

## 2021-05-30 ENCOUNTER — Ambulatory Visit
Admission: RE | Admit: 2021-05-30 | Discharge: 2021-05-30 | Disposition: A | Discharge status: Home / Self Care | Payer: Medicare Other | Source: Ambulatory Visit | Attending: Internal Medicine | Admitting: Internal Medicine

## 2021-05-30 DIAGNOSIS — I6523 Occlusion and stenosis of bilateral carotid arteries: Secondary | ICD-10-CM | POA: Insufficient documentation

## 2021-05-30 DIAGNOSIS — I771 Stricture of artery: Secondary | ICD-10-CM | POA: Diagnosis not present

## 2021-05-30 DIAGNOSIS — I2510 Atherosclerotic heart disease of native coronary artery without angina pectoris: Secondary | ICD-10-CM | POA: Insufficient documentation

## 2021-05-30 NOTE — Telephone Encounter (Signed)
Claire Glow McLean-Scocuzza, MD  05/29/2021 11:29 AM EDT      Liver kidney snormal  Cholesterol stable goal ldl <100 rec healthy diet and exercise  Thyroid lab normal Vitamin D low  -rec D3 4000 to 5000 Iu daily otc  Blood cts normal  Urine white blood cells likely contaminant

## 2021-06-07 ENCOUNTER — Other Ambulatory Visit: Payer: Self-pay

## 2021-06-07 ENCOUNTER — Ambulatory Visit
Admission: RE | Admit: 2021-06-07 | Discharge: 2021-06-07 | Disposition: A | Discharge status: Home / Self Care | Payer: Medicare Other | Source: Ambulatory Visit | Attending: Internal Medicine | Admitting: Internal Medicine

## 2021-06-07 ENCOUNTER — Ambulatory Visit: Payer: Medicare Other

## 2021-06-07 DIAGNOSIS — R1084 Generalized abdominal pain: Secondary | ICD-10-CM | POA: Insufficient documentation

## 2021-06-07 DIAGNOSIS — M542 Cervicalgia: Secondary | ICD-10-CM

## 2021-06-07 DIAGNOSIS — E041 Nontoxic single thyroid nodule: Secondary | ICD-10-CM | POA: Diagnosis not present

## 2021-06-07 DIAGNOSIS — K449 Diaphragmatic hernia without obstruction or gangrene: Secondary | ICD-10-CM | POA: Diagnosis not present

## 2021-06-07 DIAGNOSIS — K429 Umbilical hernia without obstruction or gangrene: Secondary | ICD-10-CM | POA: Diagnosis not present

## 2021-06-07 DIAGNOSIS — R590 Localized enlarged lymph nodes: Secondary | ICD-10-CM | POA: Insufficient documentation

## 2021-06-07 DIAGNOSIS — R5383 Other fatigue: Secondary | ICD-10-CM | POA: Diagnosis not present

## 2021-06-07 DIAGNOSIS — N83201 Unspecified ovarian cyst, right side: Secondary | ICD-10-CM | POA: Diagnosis not present

## 2021-06-14 ENCOUNTER — Ambulatory Visit
Admission: RE | Admit: 2021-06-14 | Discharge: 2021-06-14 | Disposition: A | Discharge status: Home / Self Care | Payer: Medicare Other | Source: Ambulatory Visit | Attending: Internal Medicine | Admitting: Internal Medicine

## 2021-06-14 ENCOUNTER — Other Ambulatory Visit: Payer: Self-pay

## 2021-06-14 DIAGNOSIS — I517 Cardiomegaly: Secondary | ICD-10-CM | POA: Diagnosis not present

## 2021-06-14 DIAGNOSIS — R0602 Shortness of breath: Secondary | ICD-10-CM

## 2021-06-14 DIAGNOSIS — I10 Essential (primary) hypertension: Secondary | ICD-10-CM | POA: Insufficient documentation

## 2021-06-14 DIAGNOSIS — I2510 Atherosclerotic heart disease of native coronary artery without angina pectoris: Secondary | ICD-10-CM | POA: Insufficient documentation

## 2021-06-14 LAB — ECHOCARDIOGRAM COMPLETE
AR max vel: 2.83 cm2
AV Area VTI: 2.76 cm2
AV Area mean vel: 2.51 cm2
AV Mean grad: 2.5 mmHg
AV Peak grad: 4.4 mmHg
Ao pk vel: 1.05 m/s
Area-P 1/2: 3.51 cm2
S' Lateral: 2.96 cm

## 2021-06-14 NOTE — Progress Notes (Signed)
*  PRELIMINARY RESULTS* Echocardiogram 2D Echocardiogram has been performed.  Claire Thomas 06/14/2021, 10:53 AM

## 2021-06-15 ENCOUNTER — Encounter: Payer: Self-pay | Admitting: Internal Medicine

## 2021-06-15 NOTE — Telephone Encounter (Signed)
Orders for both are in for [facility]. Have these orders expired? If so I can replace the orders   Please advise

## 2021-06-16 ENCOUNTER — Ambulatory Visit: Payer: Medicare Other

## 2021-07-10 ENCOUNTER — Encounter: Payer: Self-pay | Admitting: Internal Medicine

## 2021-07-10 ENCOUNTER — Telehealth: Payer: Self-pay | Admitting: Internal Medicine

## 2021-07-10 NOTE — Telephone Encounter (Signed)
Patient with diarrhea and urgency for a few weeks.  She is scheduled for office visit with Nicoletta Ba PA on 07/24/21 1:30

## 2021-07-10 NOTE — Telephone Encounter (Signed)
Inbound call from patient requesting a call back to discuss steps to do to deal with her having Diarrhea and not being able to wait to get to bathroom. Best contact number [phone]

## 2021-07-11 ENCOUNTER — Other Ambulatory Visit: Payer: Self-pay | Admitting: Internal Medicine

## 2021-07-11 DIAGNOSIS — M8580 Other specified disorders of bone density and structure, unspecified site: Secondary | ICD-10-CM

## 2021-07-11 DIAGNOSIS — E2839 Other primary ovarian failure: Secondary | ICD-10-CM

## 2021-07-11 NOTE — Telephone Encounter (Signed)
Please advise 

## 2021-07-24 ENCOUNTER — Encounter: Payer: Self-pay | Admitting: Physician Assistant

## 2021-07-24 ENCOUNTER — Ambulatory Visit (INDEPENDENT_AMBULATORY_CARE_PROVIDER_SITE_OTHER): Payer: Medicare Other | Admitting: Physician Assistant

## 2021-07-24 ENCOUNTER — Other Ambulatory Visit (INDEPENDENT_AMBULATORY_CARE_PROVIDER_SITE_OTHER): Payer: Medicare Other

## 2021-07-24 VITALS — BP 134/78 | HR 64 | Ht 63.25 in | Wt 190.4 lb

## 2021-07-24 DIAGNOSIS — I6523 Occlusion and stenosis of bilateral carotid arteries: Secondary | ICD-10-CM | POA: Diagnosis not present

## 2021-07-24 DIAGNOSIS — R152 Fecal urgency: Secondary | ICD-10-CM

## 2021-07-24 DIAGNOSIS — R197 Diarrhea, unspecified: Secondary | ICD-10-CM | POA: Diagnosis not present

## 2021-07-24 DIAGNOSIS — K580 Irritable bowel syndrome with diarrhea: Secondary | ICD-10-CM | POA: Diagnosis not present

## 2021-07-24 LAB — C-REACTIVE PROTEIN: CRP: <1 mg/dL (ref 0.5–20.0)

## 2021-07-24 LAB — SEDIMENTATION RATE: Sed Rate: 9 mm/hr (ref 0–30)

## 2021-07-24 MED ORDER — GLYCOPYRROLATE 2 MG PO TABS: 2.0000 mg | ORAL_TABLET | Freq: Two times a day (BID) | ORAL | 6 refills | Status: DC

## 2021-07-24 MED ORDER — RIFAXIMIN 550 MG PO TABS: 550.0000 mg | ORAL_TABLET | Freq: Three times a day (TID) | ORAL | 0 refills | Status: DC

## 2021-07-24 NOTE — Patient Instructions (Addendum)
If you are age 70 or older, your body mass index should be between 23-30. Your Body mass index is 33.46 kg/m. If this is out of the aforementioned range listed, please consider follow up with your Primary Care Provider. __________________________________________________________  The [facility] providers would like to encourage you to use [facility] to communicate with providers for non-urgent requests or questions.  Due to long hold times on the telephone, sending your provider a message by [facility] may be a faster and more efficient way to get a response.  Please allow 48 business hours for a response.  Please remember that this is for non-urgent requests.   Your provider has requested that you go to the basement level for lab work before leaving today. Press "B" on the elevator. The lab is located at the first door on the left as you exit the elevator.  START Glycopyrrolate 1 tablet twice daily  We have sent your demographic information and a prescription for Xifaxan to [facility]. This pharmacy is able to get medication approved through insurance and get you the lowest copay possible. If you have not heard from them within 1-2 weeks, please call our office at [phone] to let us know.  Call and speak with Amy's nurse, Beth in 2-3 weeks after finishing antibiotic, with an update on how you are doing.  Thank you for entrusting me with your care and choosing [facility].  Amy Esterwood, PA-C

## 2021-07-24 NOTE — Progress Notes (Signed)
Subjective:    Patient ID: Claire Thomas, female    DOB: Aug 28, 1951, 70 y.o.   MRN: [id number]  HPI Victorina is a pleasant 70 year old white female, established with Dr. Carlean Purl who comes in today for evaluation of progressive issues with diarrhea and fecal urgency. She was last seen in March 2019, and had undergone colonoscopy in October 2019 for history of adenomatous polyps.  She had 2 small sessile polyps removed 1 was a tubular adenoma, one was hyperplastic, noted to have left colon diverticulosis and internal hemorrhoids.  No random biopsies. Last EGD July 2016 with small hiatal hernia and small erosion in the distal esophagus. She has history of IBS-D, GERD, coronary artery disease, sleep apnea, and migraines. She says over the past couple of months she has been having problems with severe urgency and frequent accidents.  She is not having symptoms every single day but very frequently and says she has had the persistent urgency over the past few years.  Generally she will have a bowel movement after each meal and may have a total of 4-5 bowel movements in a day. She started taking a multi collagen supplement and is asking whether that would be good for her bowel.  She has no complaints of abdominal pain or cramping.  Has not noted any melena or hematochezia.  Appetite has been good. She has been under a lot of stress, she is currently staying with her 44 year old father who has dementia, and has been working during the day from his home.  She says this is one of the hardest thing she has ever had to do. She has been taking Protonix 40 mg daily for reflux but has not been on any regimen for IBS.  Review of Systems Pertinent positive and negative review of systems were noted in the above HPI section.  All other review of systems was otherwise negative.   Outpatient Encounter Medications as of 07/24/2021  Medication Sig   Calcium Carb-Cholecalciferol (CALCIUM/VITAMIN D) 600-400 MG-UNIT TABS Take  2 tablets by mouth daily.   Cholecalciferol 125 MCG (5000 UT) TABS Take 5 tablets by mouth daily.   glycopyrrolate (ROBINUL) 2 MG tablet Take 1 tablet (2 mg total) by mouth 2 (two) times daily.   pantoprazole (PROTONIX) 40 MG tablet Take 1 tablet (40 mg total) by mouth daily.   rifaximin (XIFAXAN) 550 MG TABS tablet Take 1 tablet (550 mg total) by mouth 3 (three) times daily for 14 days.   No facility-administered encounter medications on file as of 07/24/2021.   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   Asthma 10/22/2018   Centrilobular emphysema ([place]) 10/22/2018   Left hip pain 10/22/2018   CAD (coronary artery disease) 10/21/2018   Dysphagia 02/25/2018   Pulmonary nodules 08/15/2016   Seasonal allergies 03/10/2015   Chest tightness 06/18/2014   Essential hypertension 03/04/2014   Edema, peripheral 03/04/2014   Tinnitus, bilateral 03/04/2014   Obesity (BMI 30-39.9) 11/25/2013   History of colonic polyps 10/23/2012   Routine gynecological examination 08/21/2012   Osteopenia 04/15/2011   PURE HYPERCHOLESTEROLEMIA 10/19/2009   ALLERGIC RHINITIS DUE TO OTHER ALLERGEN 08/15/2009  COMMON MIGRAINE 03/21/2009   GERD without esophagitis 03/21/2009   PUD 03/21/2009   IBS (irritable bowel syndrome) 03/21/2009   Social History   Socioeconomic History   Marital status: Married    Spouse name: Not on file   Number of children: 2   Years of education: Not on file   Highest education level: Not on file  Occupational History   Occupation: Glass blower/designer  Tobacco Use   Smoking status: Never   Smokeless tobacco: Never   Vaping Use   Vaping Use: Never used  Substance and Sexual Activity   Alcohol use: Yes    Alcohol/week: 0.0 standard drinks    Comment: occassioinal-wine every 6 months   Drug use: No   Sexual activity: Yes    Comment: men  Other Topics Concern   Not on file  Social History Narrative   Married    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 Domzalski's family history includes AAA (abdominal aortic aneurysm) in her father; Arthritis in her mother and son; Asthma in her paternal grandfather; Breast cancer (age of onset: 54) in her mother; Cancer in her father and mother; Colon cancer in her mother; Dementia in her father; Diabetes in her paternal grandmother; Heart disease in her maternal grandfather; Hyperlipidemia in her brother, father, and mother; Hypertension in her brother and father; Prostate cancer in her father; Pulmonary fibrosis in her mother.      Objective:    Vitals:   07/24/21 1328  BP: 134/78  Pulse: 64    Physical Exam Well-developed well-nourished WF in no acute distress.  Height, Weight, 190 BMI 33.4  HEENT; nontraumatic normocephalic, EOMI, PE R LA, sclera anicteric. Oropharynx;not done  Neck; supple, no JVD Cardiovascular; regular rate and rhythm with S1-S2, no murmur rub or gallop Pulmonary; Clear bilaterally Abdomen; soft, nontender, nondistended, no palpable mass or hepatosplenomegaly, bowel sounds are active Rectal; not done today Skin; benign exam, no jaundice rash or appreciable lesions Extremities; no clubbing cyanosis or edema skin warm and dry Neuro/Psych;  alert and oriented x4, grossly nonfocal mood and affect appropriate        Assessment & Plan:   #22 70 year old white female with history of IBS diarrhea predominant with worsening symptoms over the past couple of months.  She is having more frequent bowel movements and severe urgency to the point of incontinence.  This is occurring frequently but not daily.  I think her symptoms are consistent with IBS,-D, rule out SIBO. As symptoms have progressed also feel important to rule out celiac disease, pancreatic insufficiency ,microscopic colitis.  #2 GERD stable 3.  History of adenomatous colon polyps-up-to-date with colonoscopy last done October 2019 #4 history of migraines 5.  Sleep apnea 6.  Coronary artery disease  Plan; Continue Protonix 40 mg p.o. every morning Start trial of glycopyrrolate 2 mg p.o. twice daily We will also give her an empiric course of Xifaxan 550 mg p.o. 3 times daily x14 days. Check sed rate, CRP, TTG and IgA/,  fecal lactoferrin and fecal elastase  I have asked her to call back with a progress report in 2 to 3 weeks after she has completed the Xifaxan.  If above labs etc. are unremarkable and symptoms are unable to be controlled then consider repeat colonoscopy with random biopsies  Tiziana Cislo Genia Harold PA-C 07/24/2021   Cc: McLean-Scocuzza, Olivia Mackie *

## 2021-07-25 LAB — TISSUE TRANSGLUTAMINASE, IGA: (tTG) Ab, IgA: <1 U/mL

## 2021-07-25 LAB — IGA: Immunoglobulin A: 322 mg/dL — ABNORMAL HIGH (ref 70–320)

## 2021-07-26 ENCOUNTER — Ambulatory Visit: Payer: Medicare Other | Admitting: Internal Medicine

## 2021-07-26 NOTE — Progress Notes (Signed)
NEUROLOGY CONSULTATION NOTE  [name] MRN: [id number] DOB: 07-10-1951  Referring provider: Orland Mustard, MD Primary care provider: Orland Mustard, MD  Reason for consult:  headache, tremor, neck pain  Assessment/Plan:   Essential tremor - nothing affecting quality of life.  Will not treat.  Monitor for now. Headache - likely cervicogenic but given new onset daily headaches, secondary intracranial etiology should be ruled out.  - MRI of brain with and without contrast - Start gabapentin '100mg'$  at bedtime.  We can increase dose in 4 weeks if needed.  - Limit use of pain relievers to no more than 2 days out of week to prevent risk of rebound or medication-overuse headache. - Follow up 6 months.   Subjective:  Claire Thomas is a 70 year old right-handed female with arthritis, asthma, CAD, HTN and sleep apnea who presents for tremor and headache.  She began noticing tremor about 6 months ago.  It only involves her left hand and occurs if she is holding something or picking something up.  TSH was 1.71.  She has had neck pain beginning around 3 months ago. No radicular symptoms.  Cervical spine X-ray from 05/10/2021 personally reviewed showed degenerative changes with disc space narrowing at C4-5 and C5-6 and mild facet hypertrophic changes but no abnormality of the odontoid or significant neural foraminal stenosis.  CT soft tissue of neck on 06/07/2021 was unremarkable.  About a month later, she started experiencing headache.  It is a moderate throbbing pain usually in the right suboccipital region/behind ear but may be left sided or across posterior neck.  It may wake her up at night.  Lasts 2 hours with Tylenol.  No nausea, vomiting, photophobia or phonophobia.  Sometimes does not see clearly.  Sed rate and CRP on 07/24/2021 were 9 and < 1.0 respectively.  Reports history of occasional migraines but never anything significant.  Denies strenuous activity injuring her neck.  Reports significant emotional stress caring for her elderly father with dementia.   PAST MEDICAL HISTORY: Past Medical History:  Diagnosis Date   Arthritis    Asthma    worse with exertion    Chicken pox    Colon polyps    Frequent headaches    GERD (gastroesophageal reflux disease)    IBS (irritable bowel syndrome)    diarrhea type   Nonobstructive CAD (coronary artery disease)    a. 06/2014 St echo: ex 8:13 (10.1 mets), moderate apical ant/mid anteroseptal HK; b. 06/2014 Cath: mild luminal irregularities, otw nl cors. EF 60%.   Osteopenia    Pulmonary nodules    Sleep apnea    wears cpap but no 02     PAST SURGICAL HISTORY: Past Surgical History:  Procedure Laterality Date   ABDOMINAL HYSTERECTOMY     1976 DUB, pelvic pain ovaries intact    CARDIAC CATHETERIZATION  07/19/2014   [facility]   CARPAL TUNNEL RELEASE Bilateral    COLONOSCOPY     ESOPHAGOGASTRODUODENOSCOPY     MENISCUS REPAIR Left    PARTIAL HYSTERECTOMY     UPPER GASTROINTESTINAL ENDOSCOPY      MEDICATIONS: Current Outpatient Medications on File Prior to Visit  Medication Sig Dispense Refill   Calcium Carb-Cholecalciferol (CALCIUM/VITAMIN D) 600-400 MG-UNIT TABS Take 2 tablets by mouth daily.     Cholecalciferol 125 MCG (5000 UT) TABS Take 5 tablets by mouth daily.     glycopyrrolate (ROBINUL) 2 MG tablet Take 1 tablet (2 mg total) by mouth 2 (two) times daily. [place]  tablet 6   pantoprazole (PROTONIX) 40 MG tablet Take 1 tablet (40 mg total) by mouth daily. 90 tablet 3   rifaximin (XIFAXAN) 550 MG TABS tablet Take 1 tablet (550 mg total) by mouth 3 (three) times daily for 14 days. 42 tablet 0   No current facility-administered medications on file prior to visit.    ALLERGIES: No Known Allergies  FAMILY HISTORY: Family History  Problem Relation Age of Onset   Colon cancer Mother    Hyperlipidemia Mother    Pulmonary fibrosis Mother    Arthritis Mother    Cancer Mother        colon    Breast cancer Mother  40   AAA (abdominal aortic aneurysm) Father    Hyperlipidemia Father    Hypertension Father    Prostate cancer Father    Cancer Father        prostate    Dementia Father        15s as of 01/06/20   Diabetes Paternal Grandmother    Hyperlipidemia Brother    Hypertension Brother    Arthritis Son    Heart disease Maternal Grandfather    Asthma Paternal Grandfather    Esophageal cancer Neg Hx    Stomach cancer Neg Hx    Rectal cancer Neg Hx    Colon polyps Neg Hx     Objective:  Blood pressure 114/87, pulse 63, height '5\' 3"'$  (1.6 m), weight 189 lb 6.4 oz (85.9 kg), SpO2 96 %. General: No acute distress.  Patient appears well-groomed.   Head:  Normocephalic/atraumatic Eyes:  fundi examined but not visualized Neck: supple, no paraspinal tenderness, full range of motion Back: No paraspinal tenderness Heart: regular rate and rhythm Lungs: Clear to auscultation bilaterally. Vascular: No carotid bruits. Neurological Exam: Mental status: alert and oriented to person, place, and time, recent and remote memory intact, fund of knowledge intact, attention and concentration intact, speech fluent and not dysarthric, language intact. Cranial nerves: CN I: not tested CN II: pupils equal, round and reactive to light, visual fields intact CN III, IV, VI:  full range of motion, no nystagmus, no ptosis CN V: facial sensation intact. CN VII: upper and lower face symmetric CN VIII: hearing intact CN IX, X: gag intact, uvula midline CN XI: sternocleidomastoid and trapezius muscles intact CN XII: tongue midline Bulk & Tone: normal, no fasciculations, no rigidity Motor:  Postural and kinetic fine tremor in left hand.  muscle strength 5/5 throughout. No bradykinesia Sensation:  Pinprick, temperature and vibratory sensation intact. Deep Tendon Reflexes:  2+ throughout,  toes downgoing.   Finger to nose testing:  Without dysmetria.   Heel to shin:  Without dysmetria.   Gait:  Normal station and  stride.  No shuffling.  Romberg negative.    Thank you for allowing me to take part in the care of this patient.  Metta Clines, DO  CC: Orland Mustard, MD

## 2021-07-27 ENCOUNTER — Other Ambulatory Visit: Payer: Medicare Other

## 2021-07-27 ENCOUNTER — Ambulatory Visit (INDEPENDENT_AMBULATORY_CARE_PROVIDER_SITE_OTHER): Payer: Medicare Other | Admitting: Neurology

## 2021-07-27 ENCOUNTER — Encounter: Payer: Self-pay | Admitting: Neurology

## 2021-07-27 ENCOUNTER — Other Ambulatory Visit: Payer: Self-pay

## 2021-07-27 VITALS — BP 114/87 | HR 63 | Ht 63.0 in | Wt 189.4 lb

## 2021-07-27 DIAGNOSIS — R197 Diarrhea, unspecified: Secondary | ICD-10-CM

## 2021-07-27 DIAGNOSIS — K580 Irritable bowel syndrome with diarrhea: Secondary | ICD-10-CM

## 2021-07-27 DIAGNOSIS — I6523 Occlusion and stenosis of bilateral carotid arteries: Secondary | ICD-10-CM | POA: Diagnosis not present

## 2021-07-27 DIAGNOSIS — M542 Cervicalgia: Secondary | ICD-10-CM

## 2021-07-27 DIAGNOSIS — R519 Headache, unspecified: Secondary | ICD-10-CM

## 2021-07-27 DIAGNOSIS — R152 Fecal urgency: Secondary | ICD-10-CM

## 2021-07-27 DIAGNOSIS — G250 Essential tremor: Secondary | ICD-10-CM

## 2021-07-27 MED ORDER — GABAPENTIN 100 MG PO CAPS: 100.0000 mg | ORAL_CAPSULE | Freq: Three times a day (TID) | ORAL | 5 refills | Status: DC

## 2021-07-27 NOTE — Patient Instructions (Addendum)
I suspect the headaches are related to neck problems such as arthritis.  But to be cautious, will check MRI of brain with and without contrast. Start gabapentin '100mg'$  at bedtime. If no improvement in headaches in 4 weeks, contact me and we can increase dose I agree that the tremor is an essential tremor, which is benign.  Monitor for now. We have sent a referral to [facility] [place] , [place] [id number]. They will call you directly to schedule your appointment . Follow up 6 months.

## 2021-08-02 ENCOUNTER — Telehealth: Payer: Self-pay

## 2021-08-02 LAB — FECAL LACTOFERRIN, QUANT
Fecal Lactoferrin: NEGATIVE
MICRO NUMBER:: 12202190
SPECIMEN QUALITY:: ADEQUATE

## 2021-08-02 LAB — PANCREATIC ELASTASE, FECAL: Pancreatic Elastase-1, Stool: 413 mcg/g

## 2021-08-02 NOTE — Telephone Encounter (Signed)
Received confirmation from [facility] that the patient has been approved. They have reached out to the patient and due to the price of the co-pay, she did not get the medication.

## 2021-08-03 NOTE — Progress Notes (Signed)
No PA Required. [id number].    Appt scheduled MRI for 08/18/21 at 1pm  pt to arrive at 12:30 pm.   LMOVM in regards to appt.

## 2021-08-03 NOTE — Telephone Encounter (Signed)
Left voicemail for patient to return call.

## 2021-08-04 NOTE — Telephone Encounter (Signed)
Tried to contact patient. No answer, unable to leave voicemail due to full voicemail

## 2021-08-07 ENCOUNTER — Telehealth: Payer: Self-pay | Admitting: Neurology

## 2021-08-07 ENCOUNTER — Ambulatory Visit
Admission: RE | Admit: 2021-08-07 | Discharge: 2021-08-07 | Disposition: A | Discharge status: Home / Self Care | Payer: Medicare Other | Source: Ambulatory Visit | Attending: Internal Medicine | Admitting: Internal Medicine

## 2021-08-07 ENCOUNTER — Other Ambulatory Visit: Payer: Self-pay

## 2021-08-07 DIAGNOSIS — M85851 Other specified disorders of bone density and structure, right thigh: Secondary | ICD-10-CM | POA: Diagnosis not present

## 2021-08-07 DIAGNOSIS — M8580 Other specified disorders of bone density and structure, unspecified site: Secondary | ICD-10-CM | POA: Diagnosis not present

## 2021-08-07 DIAGNOSIS — Z1231 Encounter for screening mammogram for malignant neoplasm of breast: Secondary | ICD-10-CM | POA: Insufficient documentation

## 2021-08-07 DIAGNOSIS — E2839 Other primary ovarian failure: Secondary | ICD-10-CM | POA: Diagnosis not present

## 2021-08-07 NOTE — Telephone Encounter (Signed)
Patient called requesting a call back to confirm her MRI order was sent to [facility] where she wants to have the test.

## 2021-08-07 NOTE — Telephone Encounter (Signed)
3rd attempt to reach out to patient. Voicemail left for patient to return phone call

## 2021-08-08 NOTE — Telephone Encounter (Signed)
Patient has not returned any phone calls, encounter will be closed now.

## 2021-08-09 ENCOUNTER — Telehealth: Payer: Self-pay | Admitting: Internal Medicine

## 2021-08-09 ENCOUNTER — Encounter: Payer: Self-pay | Admitting: Neurology

## 2021-08-09 NOTE — Telephone Encounter (Signed)
PT called back to imaging results

## 2021-08-09 NOTE — Telephone Encounter (Signed)
Babs Bertin, CMA  08/09/2021 10:36 AM EDT     Left message for patient to return call back for bone desnsity results.    Einar Pheasant, MD  08/09/2021  3:54 AM EDT     Please notify - bone density - reveals osteopenia.  Continue calcium, vitamin D and weight bearing exercise (for example walking, walking with hand weights).    Patient informed and verbalized understanding.

## 2021-08-10 NOTE — Telephone Encounter (Signed)
LMOVM as well Mychart message. Please call to have the appt moved.

## 2021-08-15 ENCOUNTER — Other Ambulatory Visit: Payer: Self-pay

## 2021-08-15 ENCOUNTER — Ambulatory Visit
Admission: RE | Admit: 2021-08-15 | Discharge: 2021-08-15 | Disposition: A | Discharge status: Home / Self Care | Payer: Medicare Other | Source: Ambulatory Visit | Attending: Neurology | Admitting: Neurology

## 2021-08-15 DIAGNOSIS — R519 Headache, unspecified: Secondary | ICD-10-CM | POA: Diagnosis not present

## 2021-08-15 MED ORDER — GADOBUTROL 1 MMOL/ML IV SOLN
8.5000 mL | Freq: Once | INTRAVENOUS | Status: AC | PRN
  Administered 2021-08-15: 8.5 mL via INTRAVENOUS

## 2021-08-17 NOTE — Progress Notes (Signed)
Left message at 1258 08/17/2021 to return call for MRI

## 2021-08-18 ENCOUNTER — Ambulatory Visit: Payer: Medicare Other

## 2021-08-21 ENCOUNTER — Encounter: Payer: Self-pay | Admitting: *Deleted

## 2021-08-22 DIAGNOSIS — L309 Dermatitis, unspecified: Secondary | ICD-10-CM | POA: Diagnosis not present

## 2021-09-28 DIAGNOSIS — R1010 Upper abdominal pain, unspecified: Secondary | ICD-10-CM | POA: Diagnosis not present

## 2021-09-28 DIAGNOSIS — R059 Cough, unspecified: Secondary | ICD-10-CM | POA: Diagnosis not present

## 2021-09-28 DIAGNOSIS — R1013 Epigastric pain: Secondary | ICD-10-CM | POA: Diagnosis not present

## 2021-09-28 DIAGNOSIS — R197 Diarrhea, unspecified: Secondary | ICD-10-CM | POA: Diagnosis not present

## 2021-09-28 DIAGNOSIS — K7689 Other specified diseases of liver: Secondary | ICD-10-CM | POA: Diagnosis not present

## 2021-09-28 DIAGNOSIS — R112 Nausea with vomiting, unspecified: Secondary | ICD-10-CM | POA: Diagnosis not present

## 2021-10-02 ENCOUNTER — Encounter: Payer: Self-pay | Admitting: Internal Medicine

## 2021-10-11 DIAGNOSIS — R197 Diarrhea, unspecified: Secondary | ICD-10-CM | POA: Diagnosis not present

## 2021-10-11 DIAGNOSIS — R1310 Dysphagia, unspecified: Secondary | ICD-10-CM | POA: Diagnosis not present

## 2021-10-11 DIAGNOSIS — R159 Full incontinence of feces: Secondary | ICD-10-CM | POA: Diagnosis not present

## 2021-10-11 DIAGNOSIS — K219 Gastro-esophageal reflux disease without esophagitis: Secondary | ICD-10-CM | POA: Diagnosis not present

## 2021-10-12 DIAGNOSIS — R053 Chronic cough: Secondary | ICD-10-CM | POA: Diagnosis not present

## 2021-10-18 ENCOUNTER — Other Ambulatory Visit: Payer: Self-pay | Admitting: Pulmonary Disease

## 2021-10-18 DIAGNOSIS — R053 Chronic cough: Secondary | ICD-10-CM

## 2021-10-23 ENCOUNTER — Telehealth: Payer: Self-pay | Admitting: Internal Medicine

## 2021-10-23 ENCOUNTER — Other Ambulatory Visit: Payer: Self-pay | Admitting: Pulmonary Disease

## 2021-10-23 DIAGNOSIS — R053 Chronic cough: Secondary | ICD-10-CM

## 2021-10-23 DIAGNOSIS — R7989 Other specified abnormal findings of blood chemistry: Secondary | ICD-10-CM

## 2021-10-24 ENCOUNTER — Other Ambulatory Visit: Payer: Self-pay

## 2021-10-24 ENCOUNTER — Ambulatory Visit (INDEPENDENT_AMBULATORY_CARE_PROVIDER_SITE_OTHER): Payer: Medicare Other | Admitting: Cardiology

## 2021-10-24 ENCOUNTER — Encounter: Payer: Self-pay | Admitting: Cardiology

## 2021-10-24 VITALS — BP 142/88 | HR 73 | Ht 65.0 in | Wt 182.0 lb

## 2021-10-24 DIAGNOSIS — R072 Precordial pain: Secondary | ICD-10-CM | POA: Diagnosis not present

## 2021-10-24 DIAGNOSIS — R0609 Other forms of dyspnea: Secondary | ICD-10-CM | POA: Diagnosis not present

## 2021-10-24 DIAGNOSIS — E7800 Pure hypercholesterolemia, unspecified: Secondary | ICD-10-CM

## 2021-10-24 DIAGNOSIS — I2510 Atherosclerotic heart disease of native coronary artery without angina pectoris: Secondary | ICD-10-CM | POA: Diagnosis not present

## 2021-10-24 DIAGNOSIS — R030 Elevated blood-pressure reading, without diagnosis of hypertension: Secondary | ICD-10-CM | POA: Diagnosis not present

## 2021-10-24 MED ORDER — METOPROLOL TARTRATE 100 MG PO TABS: 100.0000 mg | ORAL_TABLET | Freq: Once | ORAL | 0 refills | Status: DC

## 2021-10-24 MED ORDER — IVABRADINE HCL 5 MG PO TABS: 10.0000 mg | ORAL_TABLET | Freq: Once | ORAL | 0 refills | Status: AC

## 2021-10-24 MED ORDER — ASPIRIN EC 81 MG PO TBEC: 81.0000 mg | DELAYED_RELEASE_TABLET | Freq: Every day | ORAL | 3 refills | Status: AC

## 2021-10-24 MED ORDER — ATORVASTATIN CALCIUM 40 MG PO TABS: 40.0000 mg | ORAL_TABLET | Freq: Every day | ORAL | 5 refills | Status: DC

## 2021-10-24 NOTE — Progress Notes (Signed)
Cardiology Office Note:    Date:  10/24/2021   ID:  Claire Thomas, DOB July 08, 1951, MRN [id number]  PCP:  McLean-Scocuzza, Nino Glow, MD   [facility] Providers Cardiologist:  Kate Sable, MD     Referring MD: McLean-Scocuzza, Olivia Mackie *   Chief Complaint  Patient presents with   Other    Follow up -- Patient c.o increased SOB. Meds reviewed verbally with patient.     History of Present Illness:    Claire Thomas is a 70 y.o. female with a hx of nonobstructive CAD, hyperlipidemia, presenting due to shortness of breath and chest discomfort.  Patient complains of shortness of breath with exertion over the past several months.  3 to 4 weeks ago, she had sharp chest discomfort in her epigastrium.  This was different from her typical reflux pain.  She sees pulmonary medicine due to pulmonary nodules.  She is a never smoker.  Patient had a stress echo in 2015 with anteroseptal and apical hypokinesis, left heart cath in 2015 with no significant obstructive disease, mild luminal irregularities.  Echocardiogram 05/2021 showed preserved ejection fraction EF 55 to 60%, impaired relaxation.  Noncontrast chest CT 01/2021 showed LAD calcifications.  Past Medical History:  Diagnosis Date   Arthritis    Asthma    worse with exertion    Chicken pox    Colon polyps    Frequent headaches    GERD (gastroesophageal reflux disease)    IBS (irritable bowel syndrome)    diarrhea type   Nonobstructive CAD (coronary artery disease)    a. 06/2014 St echo: ex 8:13 (10.1 mets), moderate apical ant/mid anteroseptal HK; b. 06/2014 Cath: mild luminal irregularities, otw nl cors. EF 60%.   Osteopenia    Pulmonary nodules    Sleep apnea    wears cpap but no 02     Past Surgical History:  Procedure Laterality Date   ABDOMINAL HYSTERECTOMY     1976 DUB, pelvic pain ovaries intact    CARDIAC CATHETERIZATION  07/19/2014   [facility]   CARPAL TUNNEL RELEASE Bilateral    COLONOSCOPY      ESOPHAGOGASTRODUODENOSCOPY     MENISCUS REPAIR Left    PARTIAL HYSTERECTOMY     UPPER GASTROINTESTINAL ENDOSCOPY      Current Medications: Current Meds  Medication Sig   aspirin EC 81 MG tablet Take 1 tablet (81 mg total) by mouth daily. Swallow whole.   atorvastatin (LIPITOR) 40 MG tablet Take 1 tablet (40 mg total) by mouth daily.   Calcium Carb-Cholecalciferol (CALCIUM/VITAMIN D) 600-400 MG-UNIT TABS Take 2 tablets by mouth daily.   Cholecalciferol 125 MCG (5000 UT) TABS Take 5 tablets by mouth daily.   ivabradine (CORLANOR) 5 MG TABS tablet Take 2 tablets (10 mg total) by mouth once for 1 dose. Two hours prior to your CT scan.   metoprolol tartrate (LOPRESSOR) 100 MG tablet Take 1 tablet (100 mg total) by mouth once for 1 dose. Take 2 hours prior to your CT scan.   pantoprazole (PROTONIX) 40 MG tablet Take 1 tablet (40 mg total) by mouth daily.     Allergies:   Patient has no known allergies.   Social History   Socioeconomic History   Marital status: Married    Spouse name: Not on file   Number of children: 2   Years of education: Not on file   Highest education level: Not on file  Occupational History   Occupation: Glass blower/designer  Tobacco Use   Smoking  status: Never   Smokeless tobacco: Never  Vaping Use   Vaping Use: Never used  Substance and Sexual Activity   Alcohol use: Yes    Alcohol/week: 0.0 standard drinks    Comment: occassioinal-wine every 6 months   Drug use: No   Sexual activity: Yes    Comment: men  Other Topics Concern   Not on file  Social History Narrative   Married    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    2 sons    Owns guns, wears seat belt, safe in relationship    Caregiver 64 y.o dad who does not live at home   Brother living    Right handed   Caffeine   Social Determinants of Health   Financial Resource Strain: Not on file  Food Insecurity: Not on file  Transportation Needs: Not on file  Physical Activity: Not on file  Stress: Not on  file  Social Connections: Not on file     Family History: The patient's family history includes AAA (abdominal aortic aneurysm) in her father; Arthritis in her mother and son; Asthma in her paternal grandfather; Breast cancer (age of onset: 70) in her mother; Cancer in her father and mother; Colon cancer in her mother; Dementia in her father; Diabetes in her paternal grandmother; Heart disease in her maternal grandfather; Hyperlipidemia in her brother, father, and mother; Hypertension in her brother and father; Prostate cancer in her father; Pulmonary fibrosis in her mother. There is no history of Esophageal cancer, Stomach cancer, Rectal cancer, or Colon polyps.  ROS:   Please see the history of present illness.     All other systems reviewed and are negative.  EKGs/Labs/Other Studies Reviewed:    The following studies were reviewed today:   EKG:  EKG is  ordered today.  The ekg ordered today demonstrates normal sinus rhythm, normal ECG.  Recent Labs: 05/25/2021: ALT 16; BUN 16; Creatinine, Ser 0.85; Hemoglobin 13.9; Platelets 309.0; Potassium 4.7; Sodium 141; TSH 1.71  Recent Lipid Panel    Component Value Date/Time   CHOL 190 05/25/2021 0929   TRIG 171.0 (H) 05/25/2021 0929   HDL 50.40 05/25/2021 0929   CHOLHDL 4 05/25/2021 0929   VLDL 34.2 05/25/2021 0929   LDLCALC 106 (H) 05/25/2021 0929   LDLDIRECT 143.0 03/06/2011 0946     Risk Assessment/Calculations:          Physical Exam:    VS:  BP (!) 142/88 (BP Location: Left Arm, Patient Position: Sitting, Cuff Size: Normal)   Pulse 73   Ht 5\' 5"  (1.651 m)   Wt 182 lb (82.6 kg)   SpO2 97%   BMI 30.29 kg/m     Wt Readings from Last 3 Encounters:  10/24/21 182 lb (82.6 kg)  07/27/21 189 lb 6.4 oz (85.9 kg)  07/24/21 190 lb 6 oz (86.4 kg)     GEN:  Well nourished, well developed in no acute distress HEENT: Normal NECK: No JVD; No carotid bruits LYMPHATICS: No lymphadenopathy CARDIAC: RRR, no murmurs, rubs,  gallops RESPIRATORY:  Clear to auscultation without rales, wheezing or rhonchi  ABDOMEN: Soft, non-tender, non-distended MUSCULOSKELETAL:  No edema; No deformity  SKIN: Warm and dry NEUROLOGIC:  Alert and oriented x 3 PSYCHIATRIC:  Normal affect   ASSESSMENT:    1. Precordial pain   2. Dyspnea on exertion   3. Pure hypercholesterolemia   4. Coronary artery disease involving native coronary artery of native heart, unspecified whether angina present   5. Elevated  BP without diagnosis of hypertension    PLAN:    In order of problems listed above:  Chest pain, history of CAD.  Start aspirin 81 mg, start Lipitor 40 mg daily.  Recent echo with EF 55 to 60%, impaired relaxation.  Get coronary CTA to evaluate obstructive CAD. Dyspnea on exertion, chronic.  Could be an anginal equivalent.  Coronary CTA as above.  Echo with normal function.  Keep appointments with pulmonary medicine regarding possible pulmonary etiology. Hyperlipidemia, start Lipitor 40 mg daily. Nonobstructive CAD, aspirin, statin, coronary CTA as above. BP elevated, typically normal.  Low-salt diet advised.  Continue to monitor for now.  Follow-up after coronary CTA.     Medication Adjustments/Labs and Tests Ordered: Current medicines are reviewed at length with the patient today.  Concerns regarding medicines are outlined above.  Orders Placed This Encounter  Procedures   CT CORONARY MORPH W/CTA COR W/SCORE W/CA W/CM &/OR WO/CM   Basic metabolic panel   EKG 65-YYTK    Meds ordered this encounter  Medications   aspirin EC 81 MG tablet    Sig: Take 1 tablet (81 mg total) by mouth daily. Swallow whole.    Dispense:  90 tablet    Refill:  3   atorvastatin (LIPITOR) 40 MG tablet    Sig: Take 1 tablet (40 mg total) by mouth daily.    Dispense:  30 tablet    Refill:  5   metoprolol tartrate (LOPRESSOR) 100 MG tablet    Sig: Take 1 tablet (100 mg total) by mouth once for 1 dose. Take 2 hours prior to your CT scan.     Dispense:  1 tablet    Refill:  0   ivabradine (CORLANOR) 5 MG TABS tablet    Sig: Take 2 tablets (10 mg total) by mouth once for 1 dose. Two hours prior to your CT scan.    Dispense:  2 tablet    Refill:  0     Patient Instructions  Medication Instructions:   Your physician has recommended you make the following change in your medication:    START taking Aspirin 81 MG once a day.  2.    START taking Lipitor (Atorvastatin) 40 MG once a day.  *If you need a refill on your cardiac medications before your next appointment, please call your pharmacy*   Lab Work:  BMP drawn in office today.   If you have labs (blood work) drawn today and your tests are completely normal, you will receive your results only by: [place] (if you have MyChart) OR A paper copy in the mail If you have any lab test that is abnormal or we need to change your treatment, we will call you to review the results.   Testing/Procedures:  Your physician has requested that you have cardiac CT. Cardiac computed tomography (CT) is a painless test that uses an x-ray machine to take clear, detailed pictures of your heart.   Your cardiac CT will be scheduled at:  [facility] [address] [place], [place] [address] [phone]  Thursday 11/09/21 at 10:15 am  Please arrive 15 mins early for check-in and test prep.    Please follow these instructions carefully (unless otherwise directed):    On the Night Before the Test: Be sure to Drink plenty of water. Do not consume any caffeinated/decaffeinated beverages or chocolate 12 hours prior to your test.    On the Day of the Test: Drink plenty  of water until 1 hour prior to the test. Do not eat any food 4 hours prior to the test. You may take your regular medications prior to the test.  Take metoprolol (Lopressor) 100 MG two hours prior to test. Take Ivabradine (Corlanor) 10 MG two hours prior to  test. FEMALES- please wear underwire-free bra if available, avoid dresses & tight clothing        After the Test: Drink plenty of water. After receiving IV contrast, you may experience a mild flushed feeling. This is normal. On occasion, you may experience a mild rash up to 24 hours after the test. This is not dangerous. If this occurs, you can take Benadryl 25 mg and increase your fluid intake. If you experience trouble breathing, this can be serious. If it is severe call 911 IMMEDIATELY. If it is mild, please call our office. If you take any of these medications: Glipizide/Metformin, Avandament, Glucavance, please do not take 48 hours after completing test unless otherwise instructed.  Please allow 2-4 weeks for scheduling of routine cardiac CTs. Some insurance companies require a pre-authorization which may delay scheduling of this test.   For non-scheduling related questions, please contact the cardiac imaging nurse navigator should you have any questions/concerns: Marchia Bond, Cardiac Imaging Nurse Navigator Gordy Clement, Cardiac Imaging Nurse Navigator [facility] Direct Office Dial: [phone]   For scheduling needs, including cancellations and rescheduling, please call [place], [phone].    Follow-Up: At [facility], you and your health needs are our priority.  As part of our continuing mission to provide you with exceptional heart care, we have created designated Provider Care Teams.  These Care Teams include your primary Cardiologist (physician) and Advanced Practice Providers (APPs -  Physician Assistants and Nurse Practitioners) who all work together to provide you with the care you need, when you need it.  We recommend signing up for the patient portal called "MyChart".  Sign up information is provided on this After Visit Summary.  MyChart is used to connect with patients for Virtual Visits (Telemedicine).  Patients are able to view  lab/test results, encounter notes, upcoming appointments, etc.  Non-urgent messages can be sent to your provider as well.   To learn more about what you can do with MyChart, go to [email or web address].    Your next appointment:   Follow up after CCTA on 11/09/21   The format for your next appointment:   In Person  Provider:   Kate Sable, MD   Other Instructions    Signed, Kate Sable, MD  10/24/2021 10:11 AM    [place]

## 2021-10-24 NOTE — Patient Instructions (Addendum)
Medication Instructions:   Your physician has recommended you make the following change in your medication:    START taking Aspirin 81 MG once a day.  2.    START taking Lipitor (Atorvastatin) 40 MG once a day.  *If you need a refill on your cardiac medications before your next appointment, please call your pharmacy*   Lab Work:  BMP drawn in office today.   If you have labs (blood work) drawn today and your tests are completely normal, you will receive your results only by: [place] (if you have MyChart) OR A paper copy in the mail If you have any lab test that is abnormal or we need to change your treatment, we will call you to review the results.   Testing/Procedures:  Your physician has requested that you have cardiac CT. Cardiac computed tomography (CT) is a painless test that uses an x-ray machine to take clear, detailed pictures of your heart.   Your cardiac CT will be scheduled at:  [facility] [address] [place], [place] [address] [phone]  Thursday 11/09/21 at 10:15 am  Please arrive 15 mins early for check-in and test prep.    Please follow these instructions carefully (unless otherwise directed):    On the Night Before the Test: Be sure to Drink plenty of water. Do not consume any caffeinated/decaffeinated beverages or chocolate 12 hours prior to your test.    On the Day of the Test: Drink plenty of water until 1 hour prior to the test. Do not eat any food 4 hours prior to the test. You may take your regular medications prior to the test.  Take metoprolol (Lopressor) 100 MG two hours prior to test. Take Ivabradine (Corlanor) 10 MG two hours prior to test. FEMALES- please wear underwire-free bra if available, avoid dresses & tight clothing        After the Test: Drink plenty of water. After receiving IV contrast, you may experience a mild flushed feeling. This is normal. On occasion,  you may experience a mild rash up to 24 hours after the test. This is not dangerous. If this occurs, you can take Benadryl 25 mg and increase your fluid intake. If you experience trouble breathing, this can be serious. If it is severe call 911 IMMEDIATELY. If it is mild, please call our office. If you take any of these medications: Glipizide/Metformin, Avandament, Glucavance, please do not take 48 hours after completing test unless otherwise instructed.  Please allow 2-4 weeks for scheduling of routine cardiac CTs. Some insurance companies require a pre-authorization which may delay scheduling of this test.   For non-scheduling related questions, please contact the cardiac imaging nurse navigator should you have any questions/concerns: Marchia Bond, Cardiac Imaging Nurse Navigator Gordy Clement, Cardiac Imaging Nurse Navigator [place] [facility] Direct Office Dial: [phone]   For scheduling needs, including cancellations and rescheduling, please call [place], [phone].    Follow-Up: At [facility], you and your health needs are our priority.  As part of our continuing mission to provide you with exceptional heart care, we have created designated Provider Care Teams.  These Care Teams include your primary Cardiologist (physician) and Advanced Practice Providers (APPs -  Physician Assistants and Nurse Practitioners) who all work together to provide you with the care you need, when you need it.  We recommend signing up for the patient portal called "MyChart".  Sign up information is provided on this After Visit Summary.  MyChart is used to connect with patients for Virtual Visits (Telemedicine).  Patients are able to view lab/test results, encounter notes, upcoming appointments, etc.  Non-urgent messages can be sent to your provider as well.   To learn more about what you can do with MyChart, go to [email or web address].    Your next appointment:   Follow up  after CCTA on 11/09/21   The format for your next appointment:   In Person  Provider:   Kate Sable, MD or an APP   Other Instructions

## 2021-10-25 ENCOUNTER — Ambulatory Visit: Admission: RE | Admit: 2021-10-25 | Payer: Medicare Other | Source: Ambulatory Visit

## 2021-10-25 ENCOUNTER — Ambulatory Visit
Admission: RE | Admit: 2021-10-25 | Discharge: 2021-10-25 | Disposition: A | Discharge status: Home / Self Care | Payer: Medicare Other | Source: Ambulatory Visit | Attending: Pulmonary Disease | Admitting: Pulmonary Disease

## 2021-10-25 DIAGNOSIS — R059 Cough, unspecified: Secondary | ICD-10-CM | POA: Diagnosis not present

## 2021-10-25 DIAGNOSIS — R079 Chest pain, unspecified: Secondary | ICD-10-CM | POA: Diagnosis not present

## 2021-10-25 DIAGNOSIS — R053 Chronic cough: Secondary | ICD-10-CM | POA: Diagnosis not present

## 2021-10-25 DIAGNOSIS — K449 Diaphragmatic hernia without obstruction or gangrene: Secondary | ICD-10-CM | POA: Diagnosis not present

## 2021-10-25 DIAGNOSIS — R7989 Other specified abnormal findings of blood chemistry: Secondary | ICD-10-CM | POA: Diagnosis not present

## 2021-10-25 DIAGNOSIS — R0602 Shortness of breath: Secondary | ICD-10-CM | POA: Diagnosis not present

## 2021-10-25 DIAGNOSIS — R911 Solitary pulmonary nodule: Secondary | ICD-10-CM | POA: Diagnosis not present

## 2021-10-25 LAB — BASIC METABOLIC PANEL
BUN/Creatinine Ratio: 15 (ref 12–28)
BUN: 13 mg/dL (ref 8–27)
CO2: 24 mmol/L (ref 20–29)
Calcium: 9.6 mg/dL (ref 8.7–10.3)
Chloride: 104 mmol/L (ref 96–106)
Creatinine, Ser: 0.86 mg/dL (ref 0.57–1.00)
Glucose: 97 mg/dL (ref 70–99)
Potassium: 4.3 mmol/L (ref 3.5–5.2)
Sodium: 142 mmol/L (ref 134–144)
eGFR: 73 mL/min/{1.73_m2} (ref >59)

## 2021-10-25 MED ORDER — IOHEXOL 350 MG/ML SOLN
75.0000 mL | Freq: Once | INTRAVENOUS | Status: AC | PRN
  Administered 2021-10-25: 75 mL via INTRAVENOUS

## 2021-10-27 ENCOUNTER — Ambulatory Visit: Payer: Medicare Other | Admitting: Internal Medicine

## 2021-11-07 ENCOUNTER — Ambulatory Visit: Payer: Medicare Other | Admitting: Internal Medicine

## 2021-11-08 ENCOUNTER — Telehealth (HOSPITAL_COMMUNITY): Payer: Self-pay | Admitting: Emergency Medicine

## 2021-11-08 NOTE — Telephone Encounter (Signed)
Attempted to call patient regarding upcoming cardiac CT appointment. °Left message on voicemail with name and callback number °Nissa Stannard RN Navigator Cardiac Imaging °[facility] °[phone] Office °[phone] Cell ° °

## 2021-11-09 ENCOUNTER — Ambulatory Visit
Admission: RE | Admit: 2021-11-09 | Discharge: 2021-11-09 | Disposition: A | Discharge status: Home / Self Care | Payer: Medicare Other | Source: Ambulatory Visit | Attending: Cardiology | Admitting: Cardiology

## 2021-11-09 ENCOUNTER — Other Ambulatory Visit: Payer: Self-pay

## 2021-11-09 ENCOUNTER — Other Ambulatory Visit: Payer: Self-pay | Admitting: Cardiology

## 2021-11-09 DIAGNOSIS — R072 Precordial pain: Secondary | ICD-10-CM | POA: Insufficient documentation

## 2021-11-09 DIAGNOSIS — R931 Abnormal findings on diagnostic imaging of heart and coronary circulation: Secondary | ICD-10-CM | POA: Diagnosis not present

## 2021-11-09 DIAGNOSIS — I2510 Atherosclerotic heart disease of native coronary artery without angina pectoris: Secondary | ICD-10-CM | POA: Diagnosis not present

## 2021-11-09 MED ORDER — NITROGLYCERIN 0.4 MG SL SUBL
0.8000 mg | SUBLINGUAL_TABLET | Freq: Once | SUBLINGUAL | Status: AC
  Administered 2021-11-09: 11:00:00 0.8 mg via SUBLINGUAL

## 2021-11-09 MED ORDER — METOPROLOL TARTRATE 5 MG/5ML IV SOLN
10.0000 mg | Freq: Once | INTRAVENOUS | Status: AC
  Administered 2021-11-09: 11:00:00 10 mg via INTRAVENOUS

## 2021-11-09 MED ORDER — IOHEXOL 350 MG/ML SOLN
100.0000 mL | Freq: Once | INTRAVENOUS | Status: AC | PRN
  Administered 2021-11-09: 11:00:00 100 mL via INTRAVENOUS

## 2021-11-09 NOTE — Progress Notes (Signed)
Patient tolerated CT well. Drank water after. Vital signs stable encourage to drink water throughout day.Reasons explained and verbalized understanding. Ambulated steady gait.  

## 2021-11-10 ENCOUNTER — Ambulatory Visit: Admission: RE | Admit: 2021-11-10 | Payer: Medicare Other | Source: Ambulatory Visit

## 2021-11-10 DIAGNOSIS — I2510 Atherosclerotic heart disease of native coronary artery without angina pectoris: Secondary | ICD-10-CM | POA: Diagnosis not present

## 2021-11-13 ENCOUNTER — Other Ambulatory Visit: Payer: Self-pay

## 2021-11-13 ENCOUNTER — Ambulatory Visit (INDEPENDENT_AMBULATORY_CARE_PROVIDER_SITE_OTHER): Payer: Medicare Other | Admitting: Cardiology

## 2021-11-13 ENCOUNTER — Encounter: Payer: Self-pay | Admitting: Cardiology

## 2021-11-13 VITALS — BP 132/82 | HR 69 | Ht 64.0 in | Wt 181.0 lb

## 2021-11-13 DIAGNOSIS — I2510 Atherosclerotic heart disease of native coronary artery without angina pectoris: Secondary | ICD-10-CM | POA: Diagnosis not present

## 2021-11-13 DIAGNOSIS — E7800 Pure hypercholesterolemia, unspecified: Secondary | ICD-10-CM | POA: Diagnosis not present

## 2021-11-13 DIAGNOSIS — K449 Diaphragmatic hernia without obstruction or gangrene: Secondary | ICD-10-CM | POA: Diagnosis not present

## 2021-11-13 NOTE — Patient Instructions (Signed)
Medication Instructions:   Your physician recommends that you continue on your current medications as directed. Please refer to the Current Medication list given to you today.  *If you need a refill on your cardiac medications before your next appointment, please call your pharmacy*   Lab Work:  Your physician recommends that you return for FASTING lab work in: 6 MONTHS (Lipid profile)   Testing/Procedures:  None ordered   Follow-Up: At [facility], you and your health needs are our priority.  As part of our continuing mission to provide you with exceptional heart care, we have created designated Provider Care Teams.  These Care Teams include your primary Cardiologist (physician) and Advanced Practice Providers (APPs -  Physician Assistants and Nurse Practitioners) who all work together to provide you with the care you need, when you need it.  We recommend signing up for the patient portal called "MyChart".  Sign up information is provided on this After Visit Summary.  MyChart is used to connect with patients for Virtual Visits (Telemedicine).  Patients are able to view lab/test results, encounter notes, upcoming appointments, etc.  Non-urgent messages can be sent to your provider as well.   To learn more about what you can do with MyChart, go to [email or web address].    Your next appointment:   6 month(s) after lipid profile   The format for your next appointment:   In Person  Provider:   You may see Kate Sable, MD or one of the following Advanced Practice Providers on your designated Care Team:   Murray Hodgkins, NP Christell Faith, PA-C Cadence Kathlen Mody, [place]

## 2021-11-13 NOTE — Progress Notes (Signed)
Cardiology Office Note:    Date:  11/13/2021   ID:  Claire Thomas, DOB [birth year], MRN [id number]  PCP:  McLean-Scocuzza, Nino Glow, MD   [facility] Prov[facility] Cardiologist:  Kate Sable, MD     Referring MD: McLean-Scocuzza, Olivia Mackie *   Chief Complaint  Patient presents with   Other    Follow up post CTA. Meds reviewed verbally with patient.     History of Present Illness:    Claire Thomas is a 70 y.o. female with a hx of nonobstructive CAD (moderate prox LAD stenosis), hyperlipidemia, presenting for follow-up.  Previously seen due to shortness of breath and chest discomfort.  Due to history of LAD calcifications, coronary CTA was ordered to evaluate any significant stenosis.  Prior echocardiogram showed normal ejection fraction.  Presents for testing results.  Chest pain is located in the epigastrium.  Past endoscopy and colonoscopy lab.  Currently takes Protonix.  Lipitor was started after last visit, tolerating Lipitor without any adverse effects.    Prior notes  echocardiogram 05/2021 showed preserved ejection fraction EF 55 to 60%, impaired relaxation.   Noncontrast chest CT 01/2021 showed LAD calcifications.  Past Medical History:  Diagnosis Date   Arthritis    Asthma    worse with exertion    Chicken pox    Colon polyps    Frequent headaches    GERD (gastroesophageal reflux disease)    IBS (irritable bowel syndrome)    diarrhea type   Nonobstructive CAD (coronary artery disease)    a. 06/2014 St echo: ex 8:13 (10.1 mets), moderate apical ant/mid anteroseptal HK; b. 06/2014 Cath: mild luminal irregularities, otw nl cors. EF 60%.   Osteopenia    Pulmonary nodules    Sleep apnea    wears cpap but no 02     Past Surgical History:  Procedure Laterality Date   ABDOMINAL HYSTERECTOMY     1976 DUB, pelvic pain ovaries intact    CARDIAC CATHETERIZATION  07/19/2014   [facility]   CARPAL TUNNEL RELEASE Bilateral    COLONOSCOPY     ESOPHAGOGASTRODUODENOSCOPY      MENISCUS REPAIR Left    PARTIAL HYSTERECTOMY     UPPER GASTROINTESTINAL ENDOSCOPY      Current Medications: Current Meds  Medication Sig   aspirin EC 81 MG tablet Take 1 tablet (81 mg total) by mouth daily. Swallow whole.   atorvastatin (LIPITOR) 40 MG tablet Take 1 tablet (40 mg total) by mouth daily.   Calcium Carb-Cholecalciferol (CALCIUM/VITAMIN D) 600-400 MG-UNIT TABS Take 2 tablets by mouth daily.   Cholecalciferol 125 MCG (5000 UT) TABS Take 5 tablets by mouth daily.   pantoprazole (PROTONIX) 40 MG tablet Take 40 mg by mouth 2 (two) times daily.     Allergies:   Patient has no known allergies.   Social History   Socioeconomic History   Marital status: Married    Spouse name: Not on file   Number of children: 2   Years of education: Not on file   Highest education level: Not on file  Occupational History   Occupation: Glass blower/designer  Tobacco Use   Smoking status: Never   Smokeless tobacco: Never  Vaping Use   Vaping Use: Never used  Substance and Sexual Activity   Alcohol use: Yes    Alcohol/week: 0.0 standard drinks    Comment: occassioinal-wine every 6 months   Drug use: No   Sexual activity: Yes    Comment: men  Other Topics Concern  Not on file  Social History Narrative   Married    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    2 sons    Owns guns, wears seat belt, safe in relationship    Caregiver 35 y.o dad who does not live at home   Brother living    Right handed   Caffeine   Social Determinants of Health   Financial Resource Strain: Not on file  Food Insecurity: Not on file  Transportation Needs: Not on file  Physical Activity: Not on file  Stress: Not on file  Social Connections: Not on file     Family History: The patient's family history includes AAA (abdominal aortic aneurysm) in her father; Arthritis in her mother and son; Asthma in her paternal grandfather; Breast cancer (age of onset: 61) in her mother; Cancer in her father and mother; Colon  cancer in her mother; Dementia in her father; Diabetes in her paternal grandmother; Heart disease in her maternal grandfather; Hyperlipidemia in her brother, father, and mother; Hypertension in her brother and father; Prostate cancer in her father; Pulmonary fibrosis in her mother. There is no history of Esophageal cancer, Stomach cancer, Rectal cancer, or Colon polyps.  ROS:   Please see the history of present illness.     All other systems reviewed and are negative.  EKGs/Labs/Other Studies Reviewed:    The following studies were reviewed today:   EKG:  EKG not ordered today.  Recent Labs: 05/25/2021: ALT 16; Hemoglobin 13.9; Platelets 309.0; TSH 1.71 10/24/2021: BUN 13; Creatinine, Ser 0.86; Potassium 4.3; Sodium 142  Recent Lipid Panel    Component Value Date/Time   CHOL 190 05/25/2021 0929   TRIG 171.0 (H) 05/25/2021 0929   HDL 50.40 05/25/2021 0929   CHOLHDL 4 05/25/2021 0929   VLDL 34.2 05/25/2021 0929   LDLCALC 106 (H) 05/25/2021 0929   LDLDIRECT 143.0 03/06/2011 0946     Risk Assessment/Calculations:          Physical Exam:    VS:  BP 132/82 (BP Location: Left Arm, Patient Position: Sitting, Cuff Size: Normal)   Pulse 69   Ht 5\' 4"  (1.626 m)   Wt 181 lb (82.1 kg)   SpO2 98%   BMI 31.07 kg/m     Wt Readings from Last 3 Encounters:  11/13/21 181 lb (82.1 kg)  10/24/21 182 lb (82.6 kg)  07/27/21 189 lb 6.4 oz (85.9 kg)     GEN:  Well nourished, well developed in no acute distress HEENT: Normal NECK: No JVD; No carotid bruits LYMPHATICS: No lymphadenopathy CARDIAC: RRR, no murmurs, rubs, gallops RESPIRATORY:  Clear to auscultation without rales, wheezing or rhonchi  ABDOMEN: Soft, non-tender, non-distended MUSCULOSKELETAL:  No edema; No deformity  SKIN: Warm and dry NEUROLOGIC:  Alert and oriented x 3 PSYCHIATRIC:  Normal affect   ASSESSMENT:    1. Coronary artery disease involving native coronary artery of native heart, unspecified whether angina  present   2. Pure hypercholesterolemia   3. Hiatal hernia     PLAN:    In order of problems listed above:  Chest pain, history of CAD.  echo 05/2021 EF 55 to 60%, impaired relaxation.  Coronary CTA moderate proximal LAD disease.  No significant stenosis of FFR.  Continue aspirin 81, Lipitor 40. Hyperlipidemia, continue Lipitor 40 mg daily.  Repeat fasting lipid profile in 6 months. Hiatal hernia noted on chest CT.  Likely cause for pain including GERD.  Continue Protonix as prescribed.  Keep follow-up appointment with gastroenterology.  Follow-up in 6 months.     Medication Adjustments/Labs and Tests Ordered: Current medicines are reviewed at length with the patient today.  Concerns regarding medicines are outlined above.  Orders Placed This Encounter  Procedures   Lipid Profile     No orders of the defined types were placed in this encounter.    Patient Instructions  Medication Instructions:   Your physician recommends that you continue on your current medications as directed. Please refer to the Current Medication list given to you today.  *If you need a refill on your cardiac medications before your next appointment, please call your pharmacy*   Lab Work:  Your physician recommends that you return for FASTING lab work in: 6 MONTHS (Lipid profile)   Testing/Procedures:  None ordered   Follow-Up: At [facility], you and your health needs are our priority.  As part of our continuing mission to provide you with exceptional heart care, we have created designated Provider Care Teams.  These Care Teams include your primary Cardiologist (physician) and Advanced Practice Providers (APPs -  Physician Assistants and Nurse Practitioners) who all work together to provide you with the care you need, when you need it.  We recommend signing up for the patient portal called "MyChart".  Sign up information is provided on this After Visit Summary.  MyChart is used to connect with  patients for Virtual Visits (Telemedicine).  Patients are able to view lab/test results, encounter notes, upcoming appointments, etc.  Non-urgent messages can be sent to your provider as well.   To learn more about what you can do with MyChart, go to [email or web address].    Your next appointment:   6 month(s) after lipid profile   The format for your next appointment:   In Person  Provider:   You may see Kate Sable, MD or one of the following Advanced Practice Providers on your designated Care Team:   Murray Hodgkins, NP Christell Faith, PA-C Cadence Kathlen Mody, [place]   Signed, Kate Sable, MD  11/13/2021 12:03 PM    [place]

## 2021-12-07 ENCOUNTER — Ambulatory Visit: Payer: Medicare Other | Admitting: Internal Medicine

## 2021-12-21 ENCOUNTER — Ambulatory Visit: Payer: Medicare Other | Admitting: Internal Medicine

## 2021-12-29 ENCOUNTER — Encounter: Payer: Self-pay | Admitting: Gastroenterology

## 2021-12-31 ENCOUNTER — Encounter: Payer: Self-pay | Admitting: Gastroenterology

## 2021-12-31 NOTE — H&P (Signed)
Pre-Procedure H&P   Patient ID: Claire Thomas is a 71 y.o. female.  Gastroenterology Provider: Annamaria Helling, DO  Referring Provider: Laurine Blazer, PA PCP: McLean-Scocuzza, Nino Glow, MD  Date: 01/01/2022  HPI Ms. Claire Thomas is a 71 y.o. female who presents today for Esophagogastroduodenoscopy and Colonoscopy for Dysphagia; worsening IBS- fecal incontinence, diarrhea, and urgency.  Pt with long standing IBS previously followed by [facility]; Seen in office in October. At that time felt her IBS has worsened. Increasing urgency with episodes of fecal incontinence. Notes some discomfort prior to having to pass a BM. 4 loose stools per day- no blood or melena. No nocturnal awakenings. Rare constipation. Was started on colestid, but is not currently taking. In discussion, it sounds like her symptoms have improved without pharmacologic intervention at this time. Having less episodes of loose stools and incontinence. since her office visit in October   Has been avoiding ibuprofen whereas she was previously taking it frequently.  Has noted increasing dysphagia to solids and meats with suprasternal notch sticking and regurgitation. Reflux controlled by protonix with tums for break through as needed  Negative CT in June 2022 for etiology. Celiac panel, fecal elastase, inflammatory markers, stool lacteferrin were all negative in august 2022. Hgb 13.7, mcv 91 plt 293, cr 0.82.  EGD 2016; 09/2018 colonoscopy with left sided diverticulosis and int hemorrhoids.  No other acute gi complaints.   Past Medical History:  Diagnosis Date   Arthritis    Asthma    worse with exertion    Chicken pox    Colon polyps    Frequent headaches    GERD (gastroesophageal reflux disease)    IBS (irritable bowel syndrome)    diarrhea type   Nonobstructive CAD (coronary artery disease)    a. 06/2014 St echo: ex 8:13 (10.1 mets), moderate apical ant/mid anteroseptal HK; b. 06/2014 Cath: mild luminal  irregularities, otw nl cors. EF 60%.   Osteopenia    Pulmonary nodules    Seasonal allergies    Sleep apnea    wears cpap but no 02     Past Surgical History:  Procedure Laterality Date   ABDOMINAL HYSTERECTOMY     1976 DUB, pelvic pain ovaries intact    CARDIAC CATHETERIZATION  07/19/2014   [facility]   CARPAL TUNNEL RELEASE Bilateral    COLONOSCOPY     ESOPHAGOGASTRODUODENOSCOPY     KNEE ARTHROSCOPY Left 12/25/2004   MENISCUS REPAIR Left    PARTIAL HYSTERECTOMY     UPPER GASTROINTESTINAL ENDOSCOPY      Family History Mother- crc and colon polyps No other h/o GI disease or malignancy  Review of Systems  Constitutional:  Negative for activity change, appetite change, chills, diaphoresis, fatigue, fever and unexpected weight change.  HENT:  Positive for trouble swallowing. Negative for voice change.   Respiratory:  Negative for shortness of breath and wheezing.   Cardiovascular:  Negative for chest pain, palpitations and leg swelling.  Gastrointestinal:  Positive for abdominal pain and diarrhea. Negative for abdominal distention, anal bleeding, blood in stool, constipation, nausea, rectal pain and vomiting.       Increased fecal urgency and incontinence  Musculoskeletal:  Negative for arthralgias and myalgias.  Skin:  Negative for color change and pallor.  Neurological:  Negative for dizziness, syncope and weakness.  Psychiatric/Behavioral:  Negative for confusion.   All other systems reviewed and are negative.   Medications No current facility-administered medications on file prior to encounter.   Current Outpatient  Medications on File Prior to Encounter  Medication Sig Dispense Refill   Calcium Carb-Cholecalciferol (CALCIUM/VITAMIN D) 600-400 MG-UNIT TABS Take 2 tablets by mouth daily.     Cholecalciferol 125 MCG (5000 UT) TABS Take 5 tablets by mouth daily.     colestipol (COLESTID) 1 g tablet Take 1 g by mouth daily.     ibuprofen (ADVIL) 200 MG tablet Take 200 mg by  mouth as needed.     umeclidinium-vilanterol (ANORO ELLIPTA) 62.5-25 MCG/ACT AEPB Inhale 1 puff into the lungs daily.     ALBUTEROL IN Inhale 90 mcg into the lungs as needed. (Patient not taking: Reported on 01/01/2022)     estradiol (ESTRACE) 0.5 MG tablet Take 0.5 mg by mouth daily. (Patient not taking: Reported on 01/01/2022)      Pertinent medications related to GI and procedure were reviewed by me with the patient prior to the procedure   Current Facility-Administered Medications:    0.9 %  sodium chloride infusion, , Intravenous, Continuous, Annamaria Helling, DO, Last Rate: 20 mL/hr at 01/01/22 0925, New Bag at 01/01/22 0925      No Known Allergies Allergies were reviewed by me prior to the procedure  Objective    Vitals:   01/01/22 0907  BP: 135/64  Pulse: 66  Resp: 16  Temp: (!) 96.5 F (35.8 C)  TempSrc: Temporal  SpO2: 98%  Weight: 80.7 kg  Height: 5\' 6"  (1.676 m)     Physical Exam Vitals and nursing note reviewed.  Constitutional:      General: She is not in acute distress.    Appearance: Normal appearance. She is not ill-appearing, toxic-appearing or diaphoretic.  HENT:     Head: Normocephalic and atraumatic.     Nose: Nose normal.     Mouth/Throat:     Mouth: Mucous membranes are moist.     Pharynx: Oropharynx is clear.  Eyes:     General: No scleral icterus.    Extraocular Movements: Extraocular movements intact.  Cardiovascular:     Rate and Rhythm: Normal rate and regular rhythm.     Heart sounds: Normal heart sounds. No murmur heard.   No friction rub. No gallop.  Pulmonary:     Effort: Pulmonary effort is normal. No respiratory distress.     Breath sounds: Normal breath sounds. No wheezing, rhonchi or rales.  Abdominal:     General: Bowel sounds are normal. There is no distension.     Palpations: Abdomen is soft.     Tenderness: There is no abdominal tenderness. There is no guarding or rebound.  Musculoskeletal:     Cervical back: Neck  supple.     Right lower leg: No edema.     Left lower leg: No edema.  Skin:    General: Skin is warm and dry.     Coloration: Skin is not jaundiced or pale.  Neurological:     General: No focal deficit present.     Mental Status: She is alert and oriented to person, place, and time. Mental status is at baseline.  Psychiatric:        Mood and Affect: Mood normal.        Behavior: Behavior normal.        Thought Content: Thought content normal.        Judgment: Judgment normal.     Assessment:  Ms. Claire Thomas is a 71 y.o. female  who presents today for Esophagogastroduodenoscopy and Colonoscopy for Dysphagia; worsening IBS- fecal incontinence,  diarrhea, and urgency.  Plan:  Esophagogastroduodenoscopy and Colonoscopy with possible intervention today  Esophagogastroduodenoscopy and Colonoscopy with possible biopsy, control of bleeding, polypectomy, and interventions as necessary has been discussed with the patient/patient representative. Informed consent was obtained from the patient/patient representative after explaining the indication, nature, and risks of the procedure including but not limited to death, bleeding, perforation, missed neoplasm/lesions, cardiorespiratory compromise, and reaction to medications. Opportunity for questions was given and appropriate answers were provided. Patient/patient representative has verbalized understanding is amenable to undergoing the procedure.   Annamaria Helling, DO  [facility]  Portions of the record may have been created with voice recognition software. Occasional wrong-word or 'sound-a-like' substitutions may have occurred due to the inherent limitations of voice recognition software.  Read the chart carefully and recognize, using context, where substitutions may have occurred.

## 2022-01-01 ENCOUNTER — Encounter
Admission: RE | Disposition: A | Discharge status: Home / Self Care | Payer: Self-pay | Source: Home / Self Care | Attending: Gastroenterology

## 2022-01-01 ENCOUNTER — Ambulatory Visit
Admission: RE | Admit: 2022-01-01 | Discharge: 2022-01-01 | Disposition: A | Discharge status: Home / Self Care | Payer: Medicare Other | Attending: Gastroenterology | Admitting: Gastroenterology

## 2022-01-01 ENCOUNTER — Encounter: Payer: Self-pay | Admitting: Gastroenterology

## 2022-01-01 ENCOUNTER — Ambulatory Visit: Payer: Medicare Other | Admitting: Anesthesiology

## 2022-01-01 DIAGNOSIS — K2970 Gastritis, unspecified, without bleeding: Secondary | ICD-10-CM | POA: Diagnosis not present

## 2022-01-01 DIAGNOSIS — G4733 Obstructive sleep apnea (adult) (pediatric): Secondary | ICD-10-CM | POA: Diagnosis not present

## 2022-01-01 DIAGNOSIS — D124 Benign neoplasm of descending colon: Secondary | ICD-10-CM | POA: Diagnosis not present

## 2022-01-01 DIAGNOSIS — K529 Noninfective gastroenteritis and colitis, unspecified: Secondary | ICD-10-CM | POA: Diagnosis not present

## 2022-01-01 DIAGNOSIS — D126 Benign neoplasm of colon, unspecified: Secondary | ICD-10-CM | POA: Diagnosis not present

## 2022-01-01 DIAGNOSIS — K2100 Gastro-esophageal reflux disease with esophagitis, without bleeding: Secondary | ICD-10-CM | POA: Diagnosis not present

## 2022-01-01 DIAGNOSIS — K209 Esophagitis, unspecified without bleeding: Secondary | ICD-10-CM | POA: Diagnosis not present

## 2022-01-01 DIAGNOSIS — G4730 Sleep apnea, unspecified: Secondary | ICD-10-CM | POA: Insufficient documentation

## 2022-01-01 DIAGNOSIS — K648 Other hemorrhoids: Secondary | ICD-10-CM | POA: Insufficient documentation

## 2022-01-01 DIAGNOSIS — K580 Irritable bowel syndrome with diarrhea: Secondary | ICD-10-CM | POA: Insufficient documentation

## 2022-01-01 DIAGNOSIS — K5730 Diverticulosis of large intestine without perforation or abscess without bleeding: Secondary | ICD-10-CM | POA: Insufficient documentation

## 2022-01-01 DIAGNOSIS — I2510 Atherosclerotic heart disease of native coronary artery without angina pectoris: Secondary | ICD-10-CM | POA: Diagnosis not present

## 2022-01-01 DIAGNOSIS — R1310 Dysphagia, unspecified: Secondary | ICD-10-CM | POA: Insufficient documentation

## 2022-01-01 DIAGNOSIS — K635 Polyp of colon: Secondary | ICD-10-CM | POA: Diagnosis not present

## 2022-01-01 DIAGNOSIS — J449 Chronic obstructive pulmonary disease, unspecified: Secondary | ICD-10-CM | POA: Insufficient documentation

## 2022-01-01 DIAGNOSIS — I10 Essential (primary) hypertension: Secondary | ICD-10-CM | POA: Diagnosis not present

## 2022-01-01 DIAGNOSIS — D122 Benign neoplasm of ascending colon: Secondary | ICD-10-CM | POA: Diagnosis not present

## 2022-01-01 DIAGNOSIS — K644 Residual hemorrhoidal skin tags: Secondary | ICD-10-CM | POA: Insufficient documentation

## 2022-01-01 DIAGNOSIS — R197 Diarrhea, unspecified: Secondary | ICD-10-CM | POA: Diagnosis not present

## 2022-01-01 DIAGNOSIS — K2980 Duodenitis without bleeding: Secondary | ICD-10-CM | POA: Diagnosis not present

## 2022-01-01 DIAGNOSIS — K3189 Other diseases of stomach and duodenum: Secondary | ICD-10-CM | POA: Diagnosis not present

## 2022-01-01 DIAGNOSIS — D120 Benign neoplasm of cecum: Secondary | ICD-10-CM | POA: Insufficient documentation

## 2022-01-01 DIAGNOSIS — D123 Benign neoplasm of transverse colon: Secondary | ICD-10-CM | POA: Insufficient documentation

## 2022-01-01 DIAGNOSIS — K649 Unspecified hemorrhoids: Secondary | ICD-10-CM | POA: Diagnosis not present

## 2022-01-01 HISTORY — DX: Other seasonal allergic rhinitis: J30.2

## 2022-01-01 HISTORY — PX: COLONOSCOPY: SHX5424

## 2022-01-01 HISTORY — PX: ESOPHAGOGASTRODUODENOSCOPY: SHX5428

## 2022-01-01 SURGERY — COLONOSCOPY
Anesthesia: General

## 2022-01-01 MED ORDER — EPHEDRINE SULFATE 50 MG/ML IJ SOLN
INTRAMUSCULAR | Status: DC | PRN
  Administered 2022-01-01 (×4): 5 mg via INTRAVENOUS

## 2022-01-01 MED ORDER — PROPOFOL 500 MG/50ML IV EMUL
INTRAVENOUS | Status: DC | PRN
  Administered 2022-01-01: 140 ug/kg/min via INTRAVENOUS

## 2022-01-01 MED ORDER — LIDOCAINE HCL (CARDIAC) PF 100 MG/5ML IV SOSY
PREFILLED_SYRINGE | INTRAVENOUS | Status: DC | PRN
  Administered 2022-01-01: 60 mg via INTRAVENOUS

## 2022-01-01 MED ORDER — DEXMEDETOMIDINE HCL IN NACL 200 MCG/50ML IV SOLN
INTRAVENOUS | Status: DC | PRN
Start: 2022-01-01 — End: 2022-01-01
  Administered 2022-01-01: 8 ug via INTRAVENOUS

## 2022-01-01 MED ORDER — SODIUM CHLORIDE 0.9 % IV SOLN: INTRAVENOUS | Status: DC

## 2022-01-01 MED ORDER — PROPOFOL 10 MG/ML IV BOLUS
INTRAVENOUS | Status: DC | PRN
  Administered 2022-01-01: 60 mg via INTRAVENOUS

## 2022-01-01 MED ORDER — SPOT INK MARKER SYRINGE KIT
PACK | SUBMUCOSAL | Status: DC | PRN
  Administered 2022-01-01: 2 mL via SUBMUCOSAL

## 2022-01-01 MED ORDER — PHENYLEPHRINE HCL (PRESSORS) 10 MG/ML IV SOLN
INTRAVENOUS | Status: DC | PRN
  Administered 2022-01-01 (×2): 80 ug via INTRAVENOUS

## 2022-01-01 NOTE — Transfer of Care (Signed)
Immediate Anesthesia Transfer of Care Note  Patient: Claire Thomas  Procedure(s) Performed: COLONOSCOPY ESOPHAGOGASTRODUODENOSCOPY (EGD)  Patient Location: PACU  Anesthesia Type:General  Level of Consciousness: awake, alert  and oriented  Airway & Oxygen Therapy: Patient Spontanous Breathing  Post-op Assessment: Report given to RN and Post -op Vital signs reviewed and stable  Post vital signs: Reviewed and stable  Last Vitals:  Vitals Value Taken Time  BP 111/71 01/01/22 1128  Temp 36.4 C 01/01/22 1108  Pulse 59 01/01/22 1130  Resp 16 01/01/22 1130  SpO2 97 % 01/01/22 1130  Vitals shown include unvalidated device data.  Last Pain:  Vitals:   01/01/22 1118  TempSrc:   PainSc: 0-No pain         Complications: No notable events documented.

## 2022-01-01 NOTE — Op Note (Addendum)
[facility]astroenter[facility] Patient Name: Claire Thomas Procedure Date: 01/01/2022 9:54 AM MRN: [id number] Account #: [id number] Date of Birth: 25-Jun-1951 Admit Type: Outpatient Age: 71 Room: [facility] ENDO ROOM 2 Gender: Female Note Status: Finalized Instrument Name: Upper Endoscope [id number] Procedure:             Upper GI endoscopy Indications:           Dysphagia Providers:             Annamaria Helling DO, DO Medicines:             Monitored Anesthesia Care Complications:         No immediate complications. Estimated blood loss:                         Minimal. Procedure:             Pre-Anesthesia Assessment:                        - Prior to the procedure, a History and Physical was                         performed, and patient medications and allergies were                         reviewed. The patient is competent. The risks and                         benefits of the procedure and the sedation options and                         risks were discussed with the patient. All questions                         were answered and informed consent was obtained.                         Patient identification and proposed procedure were                         verified by the physician, the nurse, the anesthetist                         and the technician in the endoscopy suite. Mental                         Status Examination: alert and oriented. Airway                         Examination: normal oropharyngeal airway and neck                         mobility. Respiratory Examination: clear to                         auscultation. CV Examination: RRR, no murmurs, no S3                         or S4. Prophylactic Antibiotics: The patient does not  require prophylactic antibiotics. Prior                         Anticoagulants: The patient has taken no previous                         anticoagulant or antiplatelet agents. ASA Grade                          Assessment: III - A patient with severe systemic                         disease. After reviewing the risks and benefits, the                         patient was deemed in satisfactory condition to                         undergo the procedure. The anesthesia plan was to use                         monitored anesthesia care (MAC). Immediately prior to                         administration of medications, the patient was                         re-assessed for adequacy to receive sedatives. The                         heart rate, respiratory rate, oxygen saturations,                         blood pressure, adequacy of pulmonary ventilation, and                         response to care were monitored throughout the                         procedure. The physical status of the patient was                         re-assessed after the procedure.                        After obtaining informed consent, the endoscope was                         passed under direct vision. Throughout the procedure,                         the patient's blood pressure, pulse, and oxygen                         saturations were monitored continuously. The Endoscope                         was introduced through the mouth, and advanced to the  second part of duodenum. The upper GI endoscopy was                         accomplished without difficulty. The patient tolerated                         the procedure well. Findings:      Localized mildly erythematous mucosa without active bleeding and with no       stigmata of bleeding was found in the duodenal bulb. Biopsies for       histology were taken with a cold forceps for evaluation of celiac       disease. Estimated blood loss was minimal.      The exam of the duodenum was otherwise normal.      The entire examined stomach was normal. Biopsies were taken with a cold       forceps for Helicobacter pylori testing. Estimated blood loss  was       minimal.      The Z-line was regular. Estimated blood loss: none.      LA Grade A (one or more mucosal breaks less than 5 mm, not extending       between tops of 2 mucosal folds) esophagitis with no bleeding was found.       Estimated blood loss: none.      Esophagogastric landmarks were identified: the Z-line was found at 36 cm       from the incisors.      Normal mucosa was found in the entire esophagus. The scope was       withdrawn. Dilation was performed with a Maloney dilator with no       resistance at 52 Fr. The dilation site was examined following endoscope       reinsertion and showed no change. Estimated blood loss: none.      The exam was otherwise without abnormality. Impression:            - Erythematous duodenopathy. Biopsied.                        - Normal stomach. Biopsied.                        - Z-line regular.                        - LA Grade A reflux esophagitis with no bleeding.                        - Esophagogastric landmarks identified.                        - Normal mucosa was found in the entire esophagus.                         Dilated.                        - The examination was otherwise normal. Recommendation:        - Discharge patient to home.                        - Soft diet for 1 day.                        -  Continue present medications.                        - consider adding acid suppressive therapy with proton                         pump inhibitor. Discuss in office at appointment.                        - Await pathology results.                        - Return to GI clinic as previously scheduled.                        - The findings and recommendations were discussed with                         the patient. Procedure Code(s):     --- Professional ---                        [phone], Esophagogastroduodenoscopy, flexible,                         transoral; with biopsy, single or multiple                        [id number],  Dilation of esophagus, by unguided sound or                         bougie, single or multiple passes Diagnosis Code(s):     --- Professional ---                        K31.89, Other diseases of stomach and duodenum                        K21.00, Gastro-esophageal reflux disease with                         esophagitis, without bleeding                        R13.10, Dysphagia, unspecified CPT copyright 2019 [employer] Medical Association. All rights reserved. The codes documented in this report are preliminary and upon coder review may  be revised to meet current compliance requirements. Attending Participation:      I personally performed the entire procedure. Volney American, DO Annamaria Helling DO, DO 01/01/2022 11:20:00 AM This report has been signed electronically. Number of Addenda: 0 Note Initiated On: 01/01/2022 9:54 AM Scope Withdrawal Time: 0 hours 34 minutes 35 seconds  Total Procedure Duration: 0 hours 40 minutes 4 seconds  Estimated Blood Loss:  Estimated blood loss was minimal.      [facility]

## 2022-01-01 NOTE — Anesthesia Preprocedure Evaluation (Signed)
Anesthesia Evaluation  Patient identified by MRN, date of birth, ID band Patient awake    Reviewed: Allergy & Precautions, NPO status , Patient's Chart, lab work & pertinent test results  History of Anesthesia Complications Negative for: history of anesthetic complications  Airway Mallampati: III  TM Distance: <3 FB Neck ROM: full    Dental  (+) Chipped   Pulmonary neg shortness of breath, asthma , sleep apnea , COPD,    Pulmonary exam normal        Cardiovascular Exercise Tolerance: Good hypertension, (-) angina+ CAD  Normal cardiovascular exam     Neuro/Psych  Headaches, negative psych ROS   GI/Hepatic Neg liver ROS, PUD, GERD  Medicated and Controlled,  Endo/Other  negative endocrine ROS  Renal/GU negative Renal ROS  negative genitourinary   Musculoskeletal  (+) Arthritis ,   Abdominal   Peds  Hematology negative hematology ROS (+)   Anesthesia Other Findings Past Medical History: No date: Arthritis No date: Asthma     Comment:  worse with exertion  No date: Chicken pox No date: Colon polyps No date: Frequent headaches No date: GERD (gastroesophageal reflux disease) No date: IBS (irritable bowel syndrome)     Comment:  diarrhea type No date: Nonobstructive CAD (coronary artery disease)     Comment:  a. 06/2014 St echo: ex 8:13 (10.1 mets), moderate apical               ant/mid anteroseptal HK; b. 06/2014 Cath: mild luminal               irregularities, otw nl cors. EF 60%. No date: Osteopenia No date: Pulmonary nodules No date: Seasonal allergies No date: Sleep apnea     Comment:  wears cpap but no 02   Past Surgical History: No date: ABDOMINAL HYSTERECTOMY     Comment:  1976 DUB, pelvic pain ovaries intact  07/19/2014: CARDIAC CATHETERIZATION     Comment:  [facility] No date: CARPAL TUNNEL RELEASE; Bilateral No date: COLONOSCOPY No date: ESOPHAGOGASTRODUODENOSCOPY 12/25/2004: KNEE ARTHROSCOPY;  Left No date: MENISCUS REPAIR; Left No date: PARTIAL HYSTERECTOMY No date: UPPER GASTROINTESTINAL ENDOSCOPY  BMI    Body Mass Index: 28.73 kg/m      Reproductive/Obstetrics negative OB ROS                             Anesthesia Physical Anesthesia Plan  ASA: 3  Anesthesia Plan: General   Post-op Pain Management:    Induction: Intravenous  PONV Risk Score and Plan: Propofol infusion and TIVA  Airway Management Planned: Natural Airway and Nasal Cannula  Additional Equipment:   Intra-op Plan:   Post-operative Plan:   Informed Consent: I have reviewed the patients History and Physical, chart, labs and discussed the procedure including the risks, benefits and alternatives for the proposed anesthesia with the patient or authorized representative who has indicated his/her understanding and acceptance.     Dental Advisory Given  Plan Discussed with: Anesthesiologist, CRNA and Surgeon  Anesthesia Plan Comments: (Patient consented for risks of anesthesia including but not limited to:  - adverse reactions to medications - risk of airway placement if required - damage to eyes, teeth, lips or other oral mucosa - nerve damage due to positioning  - sore throat or hoarseness - Damage to heart, brain, nerves, lungs, other parts of body or loss of life  Patient voiced understanding.)        Anesthesia Quick Evaluation

## 2022-01-01 NOTE — Interval H&P Note (Signed)
History and Physical Interval Note: Preprocedure H&P from 01/01/22  was reviewed and there was no interval change after seeing and examining the patient.  Written consent was obtained from the patient after discussion of risks, benefits, and alternatives. Patient has consented to proceed with Esophagogastroduodenoscopy and Colonoscopy with possible intervention   01/01/2022 10:02 AM  Claire Thomas  has presented today for surgery, with the diagnosis of Diarrhea, unspecified type (R19.7) Gastroesophageal reflux disease, unspecified whether esophagitis present (K21.9) Dysphagia, unspecified type (R13.10) Incontinence of feces, unspecified fecal incontinence type (R15.9).  The various methods of treatment have been discussed with the patient and family. After consideration of risks, benefits and other options for treatment, the patient has consented to  Procedure(s): COLONOSCOPY (N/A) ESOPHAGOGASTRODUODENOSCOPY (EGD) (N/A) as a surgical intervention.  The patient's history has been reviewed, patient examined, no change in status, stable for surgery.  I have reviewed the patient's chart and labs.  Questions were answered to the patient's satisfaction.     Annamaria Helling

## 2022-01-01 NOTE — Op Note (Signed)
[facility] Gastroenterology Patient Name: Claire Thomas Procedure Date: 01/01/2022 9:54 AM MRN: [id number] Account #: [id number] Date of Birth: 09-Dec-1951 Admit Type: Outpatient Age: 71 Room: [facility] ENDO ROOM 2 Gender: Female Note Status: Finalized Instrument Name: Colonoscope [id number] Procedure:             Colonoscopy Indications:           Chronic diarrhea Providers:             Annamaria Helling DO, DO Medicines:             Monitored Anesthesia Care Complications:         No immediate complications. Estimated blood loss:                         Minimal. Procedure:             Pre-Anesthesia Assessment:                        - Prior to the procedure, a History and Physical was                         performed, and patient medications and allergies were                         reviewed. The patient is competent. The risks and                         benefits of the procedure and the sedation options and                         risks were discussed with the patient. All questions                         were answered and informed consent was obtained.                         Patient identification and proposed procedure were                         verified by the physician, the nurse, the anesthetist                         and the technician in the endoscopy suite. Mental                         Status Examination: alert and oriented. Airway                         Examination: normal oropharyngeal airway and neck                         mobility. Respiratory Examination: clear to                         auscultation. CV Examination: RRR, no murmurs, no S3                         or S4. Prophylactic Antibiotics: The patient does not  require prophylactic antibiotics. Prior                         Anticoagulants: The patient has taken no previous                         anticoagulant or antiplatelet agents. ASA Grade                          Assessment: III - A patient with severe systemic                         disease. After reviewing the risks and benefits, the                         patient was deemed in satisfactory condition to                         undergo the procedure. The anesthesia plan was to use                         monitored anesthesia care (MAC). Immediately prior to                         administration of medications, the patient was                         re-assessed for adequacy to receive sedatives. The                         heart rate, respiratory rate, oxygen saturations,                         blood pressure, adequacy of pulmonary ventilation, and                         response to care were monitored throughout the                         procedure. The physical status of the patient was                         re-assessed after the procedure.                        After obtaining informed consent, the colonoscope was                         passed under direct vision. Throughout the procedure,                         the patient's blood pressure, pulse, and oxygen                         saturations were monitored continuously. The                         Colonoscope was introduced through the anus and  advanced to the the cecum, identified by appendiceal                         orifice and ileocecal valve. The colonoscopy was                         performed without difficulty. The patient tolerated                         the procedure well. The quality of the bowel                         preparation was evaluated using the BBPS [employer] Bowel                         Preparation Scale) with scores of: Right Colon = 3,                         Transverse Colon = 3 and Left Colon = 3 (entire mucosa                         seen well with no residual staining, small fragments                         of stool or opaque liquid). The total BBPS score                          equals 9. The ileocecal valve, appendiceal orifice,                         and rectum were photographed. Findings:      Hemorrhoids were found on perianal exam.      The digital rectal exam findings include decreased sphincter tone.      Non-bleeding external and internal hemorrhoids were found during       retroflexion. Estimated blood loss: none.      A few small-mouthed diverticula were found in the recto-sigmoid colon.       Estimated blood loss: none.      Normal mucosa was found in the entire colon. Biopsies for histology were       taken with a cold forceps from the right colon and left colon for       evaluation of microscopic colitis. Estimated blood loss was minimal.      A 3 to 4 mm polyp was found in the cecum. The polyp was sessile. The       polyp was removed with a cold snare. Resection and retrieval were       complete. Estimated blood loss was minimal.      A 1 to 2 mm polyp was found in the cecum. The polyp was sessile. The       polyp was removed with a cold biopsy forceps. Resection and retrieval       were complete. Estimated blood loss was minimal.      Two sessile polyps were found in the ascending colon. The polyps were 5       to 6 mm in size. These polyps were removed with a cold snare. Resection       and retrieval were  complete. Estimated blood loss was minimal.      A 3 to 4 mm polyp was found in the transverse colon. The polyp was       sessile. The polyp was removed with a cold snare. Resection and       retrieval were complete. Estimated blood loss was minimal.      A 7 to 9 mm polyp was found in the hepatic flexure. The polyp was       sessile, long/thin along the fold. The polyp was removed with a       piecemeal technique using a cold snare. Resection and retrieval were       complete. Estimated blood loss was minimal. Area was tattooed with an       injection of 2 mL of [place] ink. Estimated blood loss was minimal.      Three sessile polyps were  found in the descending colon. The polyps were       2 to 5 mm in size. These polyps were removed with a cold snare.       Resection and retrieval were complete. Estimated blood loss was minimal.      The exam was otherwise without abnormality on direct and retroflexion       views. Impression:            - Hemorrhoids found on perianal exam.                        - Decreased sphincter tone found on digital rectal                         exam.                        - Non-bleeding external and internal hemorrhoids.                        - Diverticulosis in the recto-sigmoid colon.                        - Normal mucosa in the entire examined colon. Biopsied.                        - One 3 to 4 mm polyp in the cecum, removed with a                         cold snare. Resected and retrieved.                        - One 1 to 2 mm polyp in the cecum, removed with a                         cold biopsy forceps. Resected and retrieved.                        - Two 5 to 6 mm polyps in the ascending colon, removed                         with a cold snare. Resected and retrieved.                        -  One 3 to 4 mm polyp in the transverse colon, removed                         with a cold snare. Resected and retrieved.                        - One 7 to 9 mm polyp at the hepatic flexure, removed                         piecemeal using a cold snare. Resected and retrieved.                         Tattooed.                        - Three 2 to 5 mm polyps in the descending colon,                         removed with a cold snare. Resected and retrieved.                        - The examination was otherwise normal on direct and                         retroflexion views. Recommendation:        - Discharge patient to home.                        - Resume previous diet.                        - No aspirin, ibuprofen, naproxen, or other                         non-steroidal anti-inflammatory drugs  for 5 days after                         polyp removal.                        - Continue present medications.                        - Await pathology results.                        - Repeat colonoscopy in 6 months to review the                         polypectomy site.                        - Return to GI office as previously scheduled. Procedure Code(s):     --- Professional ---                        [phone], Colonoscopy, flexible; with removal of                         tumor(s), polyp(s), or other lesion(s) by snare  technique                        [id number], 59, Colonoscopy, flexible; with biopsy, single                         or multiple                        [id number], Colonoscopy, flexible; with directed submucosal                         injection(s), any substance Diagnosis Code(s):     --- Professional ---                        K64.8, Other hemorrhoids                        K62.89, Other specified diseases of anus and rectum                        K63.5, Polyp of colon                        K52.9, Noninfective gastroenteritis and colitis,                         unspecified                        K57.30, Diverticulosis of large intestine without                         perforation or abscess without bleeding CPT copyright 2019 American Medical Association. All rights reserved. The codes documented in this report are preliminary and upon coder review may  be revised to meet current compliance requirements. Attending Participation:      I personally performed the entire procedure. Volney American, DO Annamaria Helling DO, DO 01/01/2022 11:19:31 AM This report has been signed electronically. Number of Addenda: 0 Note Initiated On: 01/01/2022 9:54 AM Estimated Blood Loss:  Estimated blood loss was minimal.      [facility]

## 2022-01-01 NOTE — Anesthesia Postprocedure Evaluation (Signed)
Anesthesia Post Note  Patient: Claire Thomas  Procedure(s) Performed: COLONOSCOPY ESOPHAGOGASTRODUODENOSCOPY (EGD)  Patient location during evaluation: Endoscopy Anesthesia Type: General Level of consciousness: awake and alert Pain management: pain level controlled Vital Signs Assessment: post-procedure vital signs reviewed and stable Respiratory status: spontaneous breathing, nonlabored ventilation, respiratory function stable and patient connected to nasal cannula oxygen Cardiovascular status: blood pressure returned to baseline and stable Postop Assessment: no apparent nausea or vomiting Anesthetic complications: no   No notable events documented.   Last Vitals:  Vitals:   01/01/22 1128 01/01/22 1138  BP: 111/71 139/74  Pulse: 66 65  Resp: 14 15  Temp:    SpO2: 98% 98%    Last Pain:  Vitals:   01/01/22 1138  TempSrc:   PainSc: 0-No pain                 Precious Haws Tahani Potier

## 2022-01-02 LAB — SURGICAL PATHOLOGY

## 2022-01-17 DIAGNOSIS — J4540 Moderate persistent asthma, uncomplicated: Secondary | ICD-10-CM | POA: Diagnosis not present

## 2022-02-12 ENCOUNTER — Ambulatory Visit: Payer: Medicare Other | Admitting: Neurology

## 2022-02-13 ENCOUNTER — Ambulatory Visit: Payer: Medicare Other | Admitting: Internal Medicine

## 2022-02-27 ENCOUNTER — Ambulatory Visit: Payer: Medicare Other | Admitting: Internal Medicine

## 2022-03-13 ENCOUNTER — Ambulatory Visit: Payer: Medicare Other | Admitting: Internal Medicine

## 2022-03-14 ENCOUNTER — Other Ambulatory Visit: Payer: Self-pay | Admitting: Internal Medicine

## 2022-03-14 ENCOUNTER — Other Ambulatory Visit: Payer: Self-pay

## 2022-03-14 ENCOUNTER — Ambulatory Visit (INDEPENDENT_AMBULATORY_CARE_PROVIDER_SITE_OTHER): Payer: Medicare Other | Admitting: Internal Medicine

## 2022-03-14 ENCOUNTER — Encounter: Payer: Self-pay | Admitting: Internal Medicine

## 2022-03-14 VITALS — BP 124/80 | HR 63 | Temp 97.9°F | Ht 66.0 in | Wt 192.6 lb

## 2022-03-14 DIAGNOSIS — M8580 Other specified disorders of bone density and structure, unspecified site: Secondary | ICD-10-CM | POA: Diagnosis not present

## 2022-03-14 DIAGNOSIS — R0602 Shortness of breath: Secondary | ICD-10-CM

## 2022-03-14 DIAGNOSIS — Z23 Encounter for immunization: Secondary | ICD-10-CM

## 2022-03-14 DIAGNOSIS — E785 Hyperlipidemia, unspecified: Secondary | ICD-10-CM

## 2022-03-14 DIAGNOSIS — Z1231 Encounter for screening mammogram for malignant neoplasm of breast: Secondary | ICD-10-CM

## 2022-03-14 DIAGNOSIS — I10 Essential (primary) hypertension: Secondary | ICD-10-CM | POA: Diagnosis not present

## 2022-03-14 DIAGNOSIS — E559 Vitamin D deficiency, unspecified: Secondary | ICD-10-CM

## 2022-03-14 DIAGNOSIS — N3000 Acute cystitis without hematuria: Secondary | ICD-10-CM | POA: Diagnosis not present

## 2022-03-14 DIAGNOSIS — E7800 Pure hypercholesterolemia, unspecified: Secondary | ICD-10-CM

## 2022-03-14 DIAGNOSIS — Z0000 Encounter for general adult medical examination without abnormal findings: Secondary | ICD-10-CM

## 2022-03-14 LAB — COMPREHENSIVE METABOLIC PANEL
ALT: 17 U/L (ref 0–35)
AST: 15 U/L (ref 0–37)
Albumin: 4.5 g/dL (ref 3.5–5.2)
Alkaline Phosphatase: 76 U/L (ref 39–117)
BUN: 18 mg/dL (ref 6–23)
CO2: 29 mEq/L (ref 19–32)
Calcium: 9.4 mg/dL (ref 8.4–10.5)
Chloride: 104 mEq/L (ref 96–112)
Creatinine, Ser: 0.8 mg/dL (ref 0.40–1.20)
GFR: 74.56 mL/min (ref >60.00)
Glucose, Bld: 91 mg/dL (ref 70–99)
Potassium: 4.5 mEq/L (ref 3.5–5.1)
Sodium: 139 mEq/L (ref 135–145)
Total Bilirubin: 0.4 mg/dL (ref 0.2–1.2)
Total Protein: 7 g/dL (ref 6.0–8.3)

## 2022-03-14 LAB — LIPID PANEL
Cholesterol: 174 mg/dL (ref 0–200)
HDL: 52.9 mg/dL (ref >39.00)
LDL Cholesterol: 88 mg/dL (ref 0–99)
NonHDL: 121.51
Total CHOL/HDL Ratio: 3
Triglycerides: 167 mg/dL — ABNORMAL HIGH (ref 0.0–149.0)
VLDL: 33.4 mg/dL (ref 0.0–40.0)

## 2022-03-14 LAB — CBC WITH DIFFERENTIAL/PLATELET
Basophils Absolute: 0.1 10*3/uL (ref 0.0–0.1)
Basophils Relative: 1 % (ref 0.0–3.0)
Eosinophils Absolute: 0.1 10*3/uL (ref 0.0–0.7)
Eosinophils Relative: 0.6 % (ref 0.0–5.0)
HCT: 38.3 % (ref 36.0–46.0)
Hemoglobin: 13.1 g/dL (ref 12.0–15.0)
Lymphocytes Relative: 26.2 % (ref 12.0–46.0)
Lymphs Abs: 2.1 10*3/uL (ref 0.7–4.0)
MCHC: 34.3 g/dL (ref 30.0–36.0)
MCV: 93.8 fl (ref 78.0–100.0)
Monocytes Absolute: 0.6 10*3/uL (ref 0.1–1.0)
Monocytes Relative: 7.3 % (ref 3.0–12.0)
Neutro Abs: 5.2 10*3/uL (ref 1.4–7.7)
Neutrophils Relative %: 64.9 % (ref 43.0–77.0)
Platelets: 300 10*3/uL (ref 150.0–400.0)
RBC: 4.08 Mil/uL (ref 3.87–5.11)
RDW: 13.3 % (ref 11.5–15.5)
WBC: 8 10*3/uL (ref 4.0–10.5)

## 2022-03-14 LAB — VITAMIN D 25 HYDROXY (VIT D DEFICIENCY, FRACTURES): VITD: 25.15 ng/mL — ABNORMAL LOW (ref 30.00–100.00)

## 2022-03-14 MED ORDER — TETANUS-DIPHTH-ACELL PERTUSSIS 5-2.5-18.5 LF-MCG/0.5 IM SUSP: 0.5000 mL | Freq: Once | INTRAMUSCULAR | 0 refills | Status: AC

## 2022-03-14 NOTE — Progress Notes (Signed)
Chief Complaint  ?Patient presents with  ? Follow-up  ? Annual Exam  ? ?Annual  ?1. Anxiety and depression due to stress of living with father with dementia who is agreesive  ?2. Hld on lipitor 40 qhs and f/u cards Leb ?3. Chronic sob pending [facility] pulm 3 or 03/2022  ? ?Review of Systems  ?Constitutional:  Negative for weight loss.  ?HENT:  Negative for hearing loss.   ?Eyes:  Negative for blurred vision.  ?Respiratory:  Negative for shortness of breath.   ?Cardiovascular:  Negative for chest pain.  ?Gastrointestinal:  Negative for abdominal pain and blood in stool.  ?Genitourinary:  Negative for dysuria.  ?Musculoskeletal:  Negative for falls and joint pain.  ?Skin:  Negative for rash.  ?Neurological:  Negative for headaches.  ?Psychiatric/Behavioral:  Positive for depression. The patient is nervous/anxious and has insomnia.   ?Past Medical History:  ?Diagnosis Date  ? Arthritis   ? Asthma   ? worse with exertion   ? Chicken pox   ? Colon polyps   ? Frequent headaches   ? GERD (gastroesophageal reflux disease)   ? IBS (irritable bowel syndrome)   ? diarrhea type  ? Nonobstructive CAD (coronary artery disease)   ? a. 06/2014 St echo: ex 8:13 (10.1 mets), moderate apical ant/mid anteroseptal HK; b. 06/2014 Cath: mild luminal irregularities, otw nl cors. EF 60%.  ? Osteopenia   ? Pulmonary nodules   ? Seasonal allergies   ? Sleep apnea   ? wears cpap but no 02   ? ?Past Surgical History:  ?Procedure Laterality Date  ? ABDOMINAL HYSTERECTOMY    ? 1976 DUB, pelvic pain ovaries intact   ? CARDIAC CATHETERIZATION  07/19/2014  ? [place]  ? CARPAL TUNNEL RELEASE Bilateral   ? COLONOSCOPY    ? COLONOSCOPY N/A 01/01/2022  ? Procedure: COLONOSCOPY;  Surgeon: Annamaria Helling, DO;  Location: [facility] ENDOSCOPY;  Service: Gastroenterology;  Laterality: N/A;  ? ESOPHAGOGASTRODUODENOSCOPY    ? ESOPHAGOGASTRODUODENOSCOPY N/A 01/01/2022  ? Procedure: ESOPHAGOGASTRODUODENOSCOPY (EGD);  Surgeon: Annamaria Helling, DO;  Location: [facility]  ENDOSCOPY;  Service: Gastroenterology;  Laterality: N/A;  ? KNEE ARTHROSCOPY Left 12/25/2004  ? MENISCUS REPAIR Left   ? PARTIAL HYSTERECTOMY    ? UPPER GASTROINTESTINAL ENDOSCOPY    ? ?Family History  ?Problem Relation Age of Onset  ? Colon polyps Mother   ? Colon cancer Mother   ? Hyperlipidemia Mother   ? Pulmonary fibrosis Mother   ? Arthritis Mother   ? Cancer Mother   ?     colon   ? Breast cancer Mother 34  ? AAA (abdominal aortic aneurysm) Father   ? Hyperlipidemia Father   ? Hypertension Father   ? Prostate cancer Father   ? Cancer Father   ?     prostate   ? Dementia Father   ?     54s as of 01/06/20  ? Aneurysm Father   ? Hyperlipidemia Brother   ? Hypertension Brother   ? Heart disease Maternal Grandfather   ? Diabetes Paternal Grandmother   ? Asthma Paternal Grandfather   ? Arthritis Son   ? Esophageal cancer Neg Hx   ? Stomach cancer Neg Hx   ? Rectal cancer Neg Hx   ? ?Social History  ? ?Socioeconomic History  ? Marital status: Married  ?  Spouse name: Not on file  ? Number of children: 2  ? Years of education: Not on file  ? Highest education level:  Not on file  ?Occupational History  ? Occupation: Glass blower/designer  ?Tobacco Use  ? Smoking status: Never  ? Smokeless tobacco: Never  ?Vaping Use  ? Vaping Use: Never used  ?Substance and Sexual Activity  ? Alcohol use: Yes  ?  Alcohol/week: 1.0 standard drink  ?  Types: 1 Glasses of wine per week  ?  Comment: occassioinal-wine every 6 months  ? Drug use: No  ? Sexual activity: Yes  ?  Comment: men  ?Other Topics Concern  ? Not on file  ?Social History Narrative  ? Married   ? 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 2 sons   ? Owns guns, wears seat belt, safe in relationship   ? Caregiver 12 y.o dad who does not live at home  ? Brother living   ? Right handed  ? Caffeine  ? ?Social Determinants of Health  ? ?Financial Resource Strain: Not on file  ?Food Insecurity: Not on file  ?Transportation Needs: Not on file  ?Physical Activity: Not on file  ?Stress: Not  on file  ?Social Connections: Not on file  ?Intimate Partner Violence: Not on file  ? ?Current Meds  ?Medication Sig  ? aspirin EC 81 MG tablet Take 1 tablet (81 mg total) by mouth daily. Swallow whole.  ? atorvastatin (LIPITOR) 40 MG tablet Take 1 tablet (40 mg total) by mouth daily.  ? Calcium Carb-Cholecalciferol (CALCIUM/VITAMIN D) 600-400 MG-UNIT TABS Take 2 tablets by mouth daily.  ? Cholecalciferol 125 MCG (5000 UT) TABS Take 5 tablets by mouth daily.  ? ibuprofen (ADVIL) 200 MG tablet Take 200 mg by mouth as needed.  ? pantoprazole (PROTONIX) 40 MG tablet Take 40 mg by mouth 2 (two) times daily.  ? Tdap (BOOSTRIX) 5-2.5-18.5 LF-MCG/0.5 injection Inject 0.5 mLs into the muscle once for 1 dose.  ? Tdap (BOOSTRIX) 5-2.5-18.5 LF-MCG/0.5 injection Inject 0.5 mLs into the muscle once for 1 dose.  ? umeclidinium-vilanterol (ANORO ELLIPTA) 62.5-25 MCG/ACT AEPB Inhale 1 puff into the lungs daily.  ? ?No Known Allergies ?Recent Results (from the past 2160 hour(s))  ?Surgical pathology     Status: None  ? Collection Time: 01/01/22 10:11 AM  ?Result Value Ref Range  ? SURGICAL PATHOLOGY    ?  SURGICAL PATHOLOGY ?CASE: [id number] ?PATIENT: Claire Thomas ?Surgical Pathology Report ? ? ? ? ?Specimen Submitted: ?A. Duodenum, random; cbx ?B. Stomach, random; cbx ?C. Colon, random; cbx ?D. Colon polyp x2, cecum; cbx, cold snare ?E. Colon polyp x2, ascending; cold snare ?F. Colon polyp, transverse; cold snare ?G. Colon polyp, hepatic flexure; cold snare ?H. Colon polyp x3, descending; cold snare ? ?Clinical History: Diarrhea, unspecified type R19.7, gastro-esophageal ?reflux disease, unspecified whether esophagitis present K21.9, ?esophagitis, unspecified type R13.10, incontinence of feces, unspecified ?fecal incontinence type R15.9.  Esophagitis, duodenitis, colon polyps, ?hemorrhoids ? ? ? ? ? ?DIAGNOSIS: ?A. DUODENUM, RANDOM; COLD BIOPSY: ?- DUODENAL MUCOSA WITH FOCAL PROMINENT BRUNNER'S GLANDS AND REACTIVE ?OVERLYING  EPITHELIAL CHANGES. ?- NEGATIVE FOR CELIAC, DYSPLASIA, AND MALIGNANCY. ? ?B.  STOMACH, RANDOM; COLD BIOPSY: ?- ANTRAL MUCOSA WITH MILD REACTIVE GASTRITIS. ?- OX YNTIC MUCOSA WITH CHANGES CONSISTENT WITH PROTON PUMP INHIBITOR ?EFFECT. ?- NEGATIVE FOR H. PYLORI, INTESTINAL METAPLASIA, DYSPLASIA, AND ?MALIGNANCY. ? ?C.  COLON, RANDOM; COLD BIOPSY: ?- UNREMARKABLE COLONIC MUCOSA. ?- NEGATIVE FOR MICROSCOPIC COLITIS, DYSPLASIA, AND MALIGNANCY. ? ?D.  COLON POLYP X2, CECUM; COLD BIOPSY AND COLD SNARE: ?- TUBULAR ADENOMA (1); NEGATIVE FOR HIGH-GRADE DYSPLASIA AND MALIGNANCY. ?- HYPERPLASTIC POLYP (1); NEGATIVE  FOR DYSPLASIA AND MALIGNANCY. ? ?E.  COLON POLYP X2, ASCENDING; COLD SNARE: ?- TUBULAR ADENOMA (MULTIPLE FRAGMENTS). ?- NEGATIVE FOR HIGH-GRADE DYSPLASIA AND MALIGNANCY. ? ?F.  COLON POLYP, TRANSVERSE; COLD SNARE: ?- TUBULAR ADENOMA. ?- NEGATIVE FOR HIGH-GRADE DYSPLASIA AND MALIGNANCY. ? ?G.  COLON POLYP, HEPATIC FLEXURE; COLD SNARE: ?- TUBULAR ADENOMA. ?- NEGATIVE FOR HIGH-GRADE DYSPLASIA AND MALIGNANCY. ? ?H.  COLON POLYP X3, DESCENDING; COLD SNARE: ?- TUBULAR ADENOMA (3). ?- NEGATIVE FOR HIGH-GRADE DYSPLASIA AND MALIGNANCY. ? ?GROSS DESCRIPT ION: ?A. Labeled: cbx random duodenum ?Received: Formalin ?Collection time: 10:11 AM on 01/01/2022 ?Placed into formalin time: 10:11 AM on 01/01/2022 ?Tissue fragment(s): 3 ?Size: Aggregate, 0.9 x 0.2 x 0.1 cm ?Description: Tan soft tissue fragments ?Entirely submitted in 1 cassette. ? ?B. Labeled: cbx random gastrum ?Received: Formalin ?Collection time: 10:13 AM on 01/01/2022 ?Placed into formalin time: 10:13 AM on 01/01/2022 ?Tissue fragment(s): Multiple ?Size: Aggregate, 0.7 x 0.5 x 0.1 cm ?Description: Tan soft tissue fragments ?Entirely submitted in 1 cassette. ? ?C. Labeled: cbx random colon for colitis ?Received: Formalin ?Collection time: 10:29 AM on 01/01/2022 ?Placed into formalin time: 10:29 AM on 01/01/2022 ?Tissue fragment(s): Multiple ?Size: Aggregate, 0.7 x 0.5 x 0.1  cm ?Description: Tan soft tissue fragments ?Entirely submitted in 1 cassette. ? ?D. Labeled: cbx x1/cold snare x1 cecal polyps ?Received: Formalin ?Collection time: 10:32 AM on 01/01/2022 ?Placed into formalin time:

## 2022-03-15 LAB — URINE CULTURE
MICRO NUMBER:: 13164907
Result:: NO GROWTH
SPECIMEN QUALITY:: ADEQUATE

## 2022-03-15 LAB — URINALYSIS, ROUTINE W REFLEX MICROSCOPIC
Bilirubin Urine: NEGATIVE
Glucose, UA: NEGATIVE
Hgb urine dipstick: NEGATIVE
Ketones, ur: NEGATIVE
Leukocytes,Ua: NEGATIVE
Nitrite: NEGATIVE
Protein, ur: NEGATIVE
Specific Gravity, Urine: 1.005 (ref 1.001–1.035)
pH: 5.5 (ref 5.0–8.0)

## 2022-03-16 ENCOUNTER — Telehealth: Payer: Self-pay | Admitting: Cardiology

## 2022-03-16 DIAGNOSIS — E7800 Pure hypercholesterolemia, unspecified: Secondary | ICD-10-CM

## 2022-03-16 NOTE — Telephone Encounter (Signed)
Attempted to call the patient. ?No answer- I left a detailed message of results and Dr. Thereasa Solo recommendations (ok per DPR).   ?I advised her PCP has also reviewed these and had a recommendation regarding her Vitamin D level- I asked that she reach out to her PCP to discuss this. ?Otherwise, our office will reach out to her in 2 months to arrange for a repeat lab prior to her visit with Dr. Garen Lah.  ?

## 2022-03-16 NOTE — Telephone Encounter (Signed)
Kate Sable, MD  ?03/14/2022  4:14 PM EDT   ?  ?Cholesterol levels overall improving, continue medications as prescribed.  Repeat lipid panel in 2 months prior to follow-up visit with myself.  If triglycerides stay elevated, will consider adding fibrates for triglyceride control.  ? ?

## 2022-03-19 DIAGNOSIS — J4540 Moderate persistent asthma, uncomplicated: Secondary | ICD-10-CM | POA: Diagnosis not present

## 2022-03-22 DIAGNOSIS — J4540 Moderate persistent asthma, uncomplicated: Secondary | ICD-10-CM | POA: Diagnosis not present

## 2022-05-01 DIAGNOSIS — Z86010 Personal history of colonic polyps: Secondary | ICD-10-CM | POA: Diagnosis not present

## 2022-05-01 DIAGNOSIS — R197 Diarrhea, unspecified: Secondary | ICD-10-CM | POA: Diagnosis not present

## 2022-05-01 DIAGNOSIS — K219 Gastro-esophageal reflux disease without esophagitis: Secondary | ICD-10-CM | POA: Diagnosis not present

## 2022-05-02 ENCOUNTER — Encounter: Payer: Self-pay | Admitting: Internal Medicine

## 2022-05-03 ENCOUNTER — Telehealth: Payer: Self-pay | Admitting: Cardiology

## 2022-05-03 DIAGNOSIS — E7800 Pure hypercholesterolemia, unspecified: Secondary | ICD-10-CM

## 2022-05-03 NOTE — Telephone Encounter (Signed)
Patient aware [place] [facility] before visit fasting labs ? ?Please change order ?

## 2022-05-03 NOTE — Telephone Encounter (Signed)
Done

## 2022-05-14 ENCOUNTER — Ambulatory Visit: Payer: Medicare Other | Admitting: Cardiology

## 2022-05-22 ENCOUNTER — Other Ambulatory Visit: Payer: Self-pay | Admitting: Internal Medicine

## 2022-05-22 ENCOUNTER — Other Ambulatory Visit: Payer: Self-pay

## 2022-05-22 ENCOUNTER — Emergency Department
Admission: EM | Admit: 2022-05-22 | Discharge: 2022-05-22 | Disposition: A | Discharge status: Home / Self Care | Payer: Medicare Other | Attending: Emergency Medicine | Admitting: Emergency Medicine

## 2022-05-22 ENCOUNTER — Encounter: Payer: Self-pay | Admitting: Internal Medicine

## 2022-05-22 DIAGNOSIS — I10 Essential (primary) hypertension: Secondary | ICD-10-CM | POA: Insufficient documentation

## 2022-05-22 LAB — COMPREHENSIVE METABOLIC PANEL
ALT: 18 U/L (ref 0–44)
AST: 18 U/L (ref 15–41)
Albumin: 4.4 g/dL (ref 3.5–5.0)
Alkaline Phosphatase: 78 U/L (ref 38–126)
Anion gap: 9 (ref 5–15)
BUN: 19 mg/dL (ref 8–23)
CO2: 25 mmol/L (ref 22–32)
Calcium: 9.5 mg/dL (ref 8.9–10.3)
Chloride: 105 mmol/L (ref 98–111)
Creatinine, Ser: 0.81 mg/dL (ref 0.44–1.00)
GFR, Estimated: >60 mL/min (ref >60)
Glucose, Bld: 105 mg/dL — ABNORMAL HIGH (ref 70–99)
Potassium: 3.8 mmol/L (ref 3.5–5.1)
Sodium: 139 mmol/L (ref 135–145)
Total Bilirubin: 0.5 mg/dL (ref 0.3–1.2)
Total Protein: 7.6 g/dL (ref 6.5–8.1)

## 2022-05-22 LAB — CBC WITH DIFFERENTIAL/PLATELET
Abs Immature Granulocytes: 0.02 10*3/uL (ref 0.00–0.07)
Basophils Absolute: 0 10*3/uL (ref 0.0–0.1)
Basophils Relative: 0 %
Eosinophils Absolute: 0.1 10*3/uL (ref 0.0–0.5)
Eosinophils Relative: 1 %
HCT: 40.9 % (ref 36.0–46.0)
Hemoglobin: 13.5 g/dL (ref 12.0–15.0)
Immature Granulocytes: 0 %
Lymphocytes Relative: 27 %
Lymphs Abs: 2 10*3/uL (ref 0.7–4.0)
MCH: 31.1 pg (ref 26.0–34.0)
MCHC: 33 g/dL (ref 30.0–36.0)
MCV: 94.2 fL (ref 80.0–100.0)
Monocytes Absolute: 0.5 10*3/uL (ref 0.1–1.0)
Monocytes Relative: 6 %
Neutro Abs: 5 10*3/uL (ref 1.7–7.7)
Neutrophils Relative %: 66 %
Platelets: 265 10*3/uL (ref 150–400)
RBC: 4.34 MIL/uL (ref 3.87–5.11)
RDW: 12 % (ref 11.5–15.5)
WBC: 7.6 10*3/uL (ref 4.0–10.5)
nRBC: 0 % (ref 0.0–0.2)

## 2022-05-22 MED ORDER — AMLODIPINE BESYLATE 2.5 MG PO TABS: 2.5000 mg | ORAL_TABLET | Freq: Every day | ORAL | 0 refills | Status: DC

## 2022-05-22 NOTE — ED Triage Notes (Signed)
Pt sent from [facility] with c/o elevated b/p today with a HA, states she is not on any medication for HTN. 180/90  Pt is ambulatory with no distress noted with a steady gait.

## 2022-05-22 NOTE — ED Provider Notes (Signed)
[facility] Provider Note    Event Date/Time   First MD Initiated Contact with Patient 05/22/22 1006     (approximate)  History   Chief Complaint: Hypertension  HPI  Claire Thomas is a 71 y.o. female with a past medical history of arthritis, gastric reflux, presents to the emergency department for high blood pressure.  According to the patient she has been under tremendous amount of stress recently, states she lives with her dad who has dementia and they have been looking into care homes.  Patient states she woke this morning with a headache, took her blood pressure and it was elevated to around 180/90.  Was concerned so she called her doctor and the nurse hotline told her to call EMS.  Patient went up to the fire department and had them check her blood pressure at that time it was greater than 809 systolic so the patient drove to [facility]in [facility] and was referred to the emergency department.  Patient denies any chest pain or shortness of breath.  States the headache is nearly gone.  Patient states she typically gets headaches frequently and that was not abnormal.  States she has been checking her blood pressure at home and at times it is elevated, but for the most part it is around 140/70.  Physical Exam   Triage Vital Signs: ED Triage Vitals [05/22/22 0849]  Enc Vitals Group     BP (!) 147/87     Pulse Rate 65     Resp 18     Temp 98 F (36.7 C)     Temp Source Oral     SpO2 96 %     Weight      Height      Head Circumference      Peak Flow      Pain Score      Pain Loc      Pain Edu?      Excl. in [place]?     Most recent vital signs: Vitals:   05/22/22 0849 05/22/22 1022  BP: (!) 147/87 (!) 181/94  Pulse: 65 67  Resp: 18 18  Temp: 98 F (36.7 C)   SpO2: 96% 98%    General: Awake, no distress.  CV:  Good peripheral perfusion.  Regular rate and rhythm  Resp:  Normal effort.  Equal breath sounds bilaterally.  Abd:  No distention.   Soft, nontender.  No rebound or guarding.    ED Results / Procedures / Treatments   EKG  EKG viewed and interpreted by myself shows a normal sinus rhythm at 59 bpm with a narrow QRS, normal axis, normal intervals, no concerning ST changes.   MEDICATIONS ORDERED IN ED: Medications - No data to display   IMPRESSION / MDM / [place] / ED COURSE  I reviewed the triage vital signs and the nursing notes.  Patient presents to the emergency department with concerns of high blood pressure.  Patient's blood pressure upon arrival 147/87.  Patient has no concerning symptoms on my evaluation.  Reassuring physical exam.  Patient's labs are reassuring including a normal CBC, normal chemistry.  EKG is normal as well.  Patient has a list of her blood pressure readouts with her.  Blood pressure for the most part has been in the 140/70 range.  I do not believe the patient necessarily needs to be started on antihypertensives from the emergency department.  I suspect significant amount of the hypertension is  due to anxiety/stress as well as anxiety regarding the blood pressure itself.  I spoke with the patient reassured given today's normal work-up and she will follow-up with her primary care doctor.  Patient agreeable to plan of care.  FINAL CLINICAL IMPRESSION(S) / ED DIAGNOSES   Hypertension  Rx / DC Orders   PCP follow-up  Note:  This document was prepared using Dragon voice recognition software and may include unintentional dictation errors.   Harvest Dark, MD 05/22/22 1036

## 2022-06-14 ENCOUNTER — Other Ambulatory Visit: Payer: Self-pay | Admitting: Internal Medicine

## 2022-06-14 DIAGNOSIS — I10 Essential (primary) hypertension: Secondary | ICD-10-CM

## 2022-07-03 ENCOUNTER — Ambulatory Visit: Payer: Medicare Other | Admitting: Cardiology

## 2022-08-15 ENCOUNTER — Encounter: Payer: Self-pay | Admitting: Internal Medicine

## 2022-08-21 ENCOUNTER — Ambulatory Visit: Payer: Medicare Other | Admitting: Cardiology

## 2022-09-14 ENCOUNTER — Ambulatory Visit: Payer: Medicare Other | Admitting: Internal Medicine

## 2022-09-17 ENCOUNTER — Encounter: Payer: Medicare Other | Admitting: Family Medicine

## 2022-10-01 ENCOUNTER — Other Ambulatory Visit
Admit: 2022-10-01 | Discharge: 2022-10-01 | Disposition: A | Discharge status: Home / Self Care | Payer: Medicare Other | Source: Ambulatory Visit | Attending: Cardiology | Admitting: Cardiology

## 2022-10-01 DIAGNOSIS — H6123 Impacted cerumen, bilateral: Secondary | ICD-10-CM | POA: Diagnosis not present

## 2022-10-01 DIAGNOSIS — H9201 Otalgia, right ear: Secondary | ICD-10-CM | POA: Diagnosis not present

## 2022-10-01 DIAGNOSIS — M26621 Arthralgia of right temporomandibular joint: Secondary | ICD-10-CM | POA: Diagnosis not present

## 2022-10-01 DIAGNOSIS — E7800 Pure hypercholesterolemia, unspecified: Secondary | ICD-10-CM | POA: Diagnosis not present

## 2022-10-01 LAB — LIPID PANEL
Cholesterol: 211 mg/dL — ABNORMAL HIGH (ref 0–200)
HDL: 56 mg/dL (ref >40)
LDL Cholesterol: 121 mg/dL — ABNORMAL HIGH (ref 0–99)
Total CHOL/HDL Ratio: 3.8 RATIO
Triglycerides: 168 mg/dL — ABNORMAL HIGH (ref <150)
VLDL: 34 mg/dL (ref 0–40)

## 2022-10-04 ENCOUNTER — Ambulatory Visit: Admit: 2022-10-04 | Payer: Medicare Other | Admitting: Gastroenterology

## 2022-10-04 SURGERY — COLONOSCOPY
Anesthesia: General

## 2022-10-05 ENCOUNTER — Telehealth: Payer: Self-pay

## 2022-10-05 MED ORDER — ATORVASTATIN CALCIUM 80 MG PO TABS: 80.0000 mg | ORAL_TABLET | Freq: Every day | ORAL | 5 refills | Status: DC

## 2022-10-05 NOTE — Telephone Encounter (Signed)
-----   Message from Kate Sable, MD sent at 10/04/2022  5:12 PM EDT ----- Cholesterol elevated, increase Lipitor to 80 mg daily.

## 2022-10-05 NOTE — Telephone Encounter (Signed)
The patient has been notified of the result via VM per DPR on file. Prescription sent into patients pharmacy. Encouraged patient to call back with any questions or concerns.

## 2022-10-08 ENCOUNTER — Other Ambulatory Visit: Payer: Self-pay

## 2022-10-08 ENCOUNTER — Encounter: Payer: Self-pay | Admitting: Cardiology

## 2022-10-08 ENCOUNTER — Ambulatory Visit: Payer: Medicare Other | Admitting: Cardiology

## 2022-10-08 DIAGNOSIS — E7800 Pure hypercholesterolemia, unspecified: Secondary | ICD-10-CM

## 2022-10-08 MED ORDER — ATORVASTATIN CALCIUM 40 MG PO TABS: 40.0000 mg | ORAL_TABLET | Freq: Every day | ORAL | 0 refills | Status: DC

## 2022-10-08 NOTE — Telephone Encounter (Signed)
Patient sent MyChart message stating that she had not been taking her Atorvastatin at all. Requested that we send in the 40 MG tablet and stated that she will begin taking it again.

## 2022-10-08 NOTE — Addendum Note (Signed)
Addended by: Kavin Leech on: 10/08/2022 12:46 PM   Modules accepted: Orders

## 2022-10-08 NOTE — Progress Notes (Signed)
See MyChart message 10/08/22.

## 2022-10-17 DIAGNOSIS — D120 Benign neoplasm of cecum: Secondary | ICD-10-CM | POA: Diagnosis not present

## 2022-10-17 DIAGNOSIS — K5730 Diverticulosis of large intestine without perforation or abscess without bleeding: Secondary | ICD-10-CM | POA: Diagnosis not present

## 2022-10-17 DIAGNOSIS — K621 Rectal polyp: Secondary | ICD-10-CM | POA: Diagnosis not present

## 2022-10-17 DIAGNOSIS — Z1211 Encounter for screening for malignant neoplasm of colon: Secondary | ICD-10-CM | POA: Diagnosis not present

## 2022-10-17 DIAGNOSIS — Z86010 Personal history of colonic polyps: Secondary | ICD-10-CM | POA: Diagnosis not present

## 2022-10-17 DIAGNOSIS — D124 Benign neoplasm of descending colon: Secondary | ICD-10-CM | POA: Diagnosis not present

## 2022-10-17 DIAGNOSIS — D122 Benign neoplasm of ascending colon: Secondary | ICD-10-CM | POA: Diagnosis not present

## 2022-10-17 DIAGNOSIS — K640 First degree hemorrhoids: Secondary | ICD-10-CM | POA: Diagnosis not present

## 2022-10-17 DIAGNOSIS — D128 Benign neoplasm of rectum: Secondary | ICD-10-CM | POA: Diagnosis not present

## 2022-10-17 DIAGNOSIS — K589 Irritable bowel syndrome without diarrhea: Secondary | ICD-10-CM | POA: Diagnosis not present

## 2022-11-05 DIAGNOSIS — L821 Other seborrheic keratosis: Secondary | ICD-10-CM | POA: Diagnosis not present

## 2022-11-05 DIAGNOSIS — L570 Actinic keratosis: Secondary | ICD-10-CM | POA: Diagnosis not present

## 2022-11-05 DIAGNOSIS — L820 Inflamed seborrheic keratosis: Secondary | ICD-10-CM | POA: Diagnosis not present

## 2022-11-05 DIAGNOSIS — D225 Melanocytic nevi of trunk: Secondary | ICD-10-CM | POA: Diagnosis not present

## 2022-11-05 DIAGNOSIS — D1801 Hemangioma of skin and subcutaneous tissue: Secondary | ICD-10-CM | POA: Diagnosis not present

## 2022-11-05 DIAGNOSIS — D485 Neoplasm of uncertain behavior of skin: Secondary | ICD-10-CM | POA: Diagnosis not present

## 2022-11-28 ENCOUNTER — Encounter: Payer: Self-pay | Admitting: Family Medicine

## 2022-11-28 ENCOUNTER — Ambulatory Visit (INDEPENDENT_AMBULATORY_CARE_PROVIDER_SITE_OTHER): Payer: Medicare Other | Admitting: Family Medicine

## 2022-11-28 VITALS — BP 142/86 | HR 64 | Temp 98.0°F | Ht 66.0 in | Wt 176.0 lb

## 2022-11-28 DIAGNOSIS — I10 Essential (primary) hypertension: Secondary | ICD-10-CM | POA: Diagnosis not present

## 2022-11-28 DIAGNOSIS — E559 Vitamin D deficiency, unspecified: Secondary | ICD-10-CM | POA: Diagnosis not present

## 2022-11-28 DIAGNOSIS — J432 Centrilobular emphysema: Secondary | ICD-10-CM

## 2022-11-28 DIAGNOSIS — I25118 Atherosclerotic heart disease of native coronary artery with other forms of angina pectoris: Secondary | ICD-10-CM

## 2022-11-28 DIAGNOSIS — Z1231 Encounter for screening mammogram for malignant neoplasm of breast: Secondary | ICD-10-CM

## 2022-11-28 DIAGNOSIS — E785 Hyperlipidemia, unspecified: Secondary | ICD-10-CM

## 2022-11-28 DIAGNOSIS — Z0000 Encounter for general adult medical examination without abnormal findings: Secondary | ICD-10-CM

## 2022-11-28 MED ORDER — VITAMIN D (ERGOCALCIFEROL) 1.25 MG (50000 UNIT) PO CAPS: 50000.0000 [IU] | ORAL_CAPSULE | ORAL | 0 refills | Status: DC

## 2022-11-28 NOTE — Progress Notes (Signed)
SUBJECTIVE:   Chief Complaint  Patient presents with   Transitions Of Care   HPI Patient presents to clinic to transfer care.  No acute concerns today.  Hypertension Asymptomatic.  Self discontinued antihypertensive medication.  Was prescribed amlodipine 2.5 mg daily for BP goal of less than 130/80.  Reports BP at home 130 over 60s.  Hyperlipidemia Takes Lipitor 40 mg daily and tolerating medication well.  Recent LDL 121.  Had not been taking Lipitor at that time.  Follows with cardiology.  Vitamin D deficiency Reports consistently low levels of vitamin D.  Taking vitamin D 5000 mg daily as well as calcium/vitamin D 600-400 mg 2 tabs daily.  Asthma/centrilobular emphysema/OSA/pulmonary nodules Follows with pulmonology at [facility] takes albuterol as needed.  Not using Anora Ellipta.    PERTINENT PMH / PSH: Hypertension CAD Centrilobular emphysema OSA Asthma Pulmonary nodules Osteopenia PUD  OBJECTIVE:  BP (!) 142/86   Pulse 64   Temp 98 F (36.7 C)   Ht '5\' 6"'$  (1.676 m)   Wt 176 lb (79.8 kg)   SpO2 99%   BMI 28.41 kg/m    Physical Exam Vitals reviewed.  Constitutional:      General: She is not in acute distress.    Appearance: She is not ill-appearing.  HENT:     Head: Normocephalic.     Nose: Nose normal.  Eyes:     Conjunctiva/sclera: Conjunctivae normal.  Cardiovascular:     Rate and Rhythm: Normal rate and regular rhythm.     Heart sounds: Normal heart sounds.  Pulmonary:     Effort: Pulmonary effort is normal.     Breath sounds: Normal breath sounds.  Abdominal:     General: Abdomen is flat. Bowel sounds are normal.     Palpations: Abdomen is soft.  Musculoskeletal:        General: Normal range of motion.     Cervical back: Normal range of motion.  Neurological:     Mental Status: She is alert and oriented to person, place, and time. Mental status is at baseline.  Psychiatric:        Mood and Affect: Mood normal.        Behavior:  Behavior normal.        Thought Content: Thought content normal.        Judgment: Judgment normal.     ASSESSMENT/PLAN:  Essential hypertension -     Comprehensive metabolic panel; Future -     TSH; Future  Coronary artery disease of native artery of native heart with stable angina pectoris [facility]) Assessment & Plan: Chronic.  Stable. Continue ASA 81 mg daily Continue Lipitor 40 mg daily Follows with cardiology  Orders: -     CBC with Differential/Platelet; Future -     Vitamin B12; Future  Centrilobular emphysema ([place]) Assessment & Plan: Chronic.  Stable.  Takes albuterol as needed.  Not currently taking an oral Ellipta. [facility]   Vitamin D deficiency -     VITAMIN D 25 Hydroxy (Vit-D Deficiency, Fractures); Future -     Vitamin D (Ergocalciferol); Take 1 capsule (50,000 Units total) by mouth every 7 (seven) days.  Dispense: 12 capsule; Refill: 0  Hyperlipidemia, unspecified hyperlipidemia type Assessment & Plan: Chronic.   Currently on statin and tolerating well. Continue Lipitor 40 mg daily Follows with cardiology  Orders: -     Hemoglobin A1c; Future  Breast cancer screening by mammogram -     3D Screening  Mammogram, Left and Right; Future  HCM Colonoscopy due in 2024, for history of polyps  PDMP reviewed  Return in about 3 months (around 02/27/2023) for annual visit with fasting labs 1 week prior.  Carollee Leitz, MD

## 2022-11-28 NOTE — Patient Instructions (Addendum)
It was a pleasure meeting you today. Thank you for allowing me to take part in your health care.  Our goals for today as we discussed include:  Stop Vitamin D 5000 units Start Vitamin D 1.25 mg weekly for 12 weeks Will repeat at next visit.  Schedule lab appointment 1 week prior to next annual visit in 3 months  Monitor blood pressure at home and record readings. Will review at next visit and can discuss restarting medication if needed.  Follow up with Cardiology as scheduled.  Could consider asking for Calcium scoring CT if not wanting to taking Lipitor  If you have any questions or concerns, please do not hesitate to call the office at [phone].  I look forward to our next visit and until then take care and stay safe.  Regards,   Carollee Leitz, MD   [facility]

## 2022-12-14 ENCOUNTER — Encounter: Payer: Self-pay | Admitting: Family Medicine

## 2022-12-14 NOTE — Telephone Encounter (Signed)
She needs to complete a visit for this. This could be virtual with any provider. She could do a mychart urgent care visit or an e-visit.

## 2022-12-17 ENCOUNTER — Encounter: Payer: Self-pay | Admitting: Family Medicine

## 2022-12-17 DIAGNOSIS — Z1231 Encounter for screening mammogram for malignant neoplasm of breast: Secondary | ICD-10-CM | POA: Insufficient documentation

## 2022-12-17 DIAGNOSIS — I25118 Atherosclerotic heart disease of native coronary artery with other forms of angina pectoris: Secondary | ICD-10-CM | POA: Insufficient documentation

## 2022-12-17 DIAGNOSIS — E559 Vitamin D deficiency, unspecified: Secondary | ICD-10-CM | POA: Insufficient documentation

## 2022-12-17 HISTORY — DX: Encounter for screening mammogram for malignant neoplasm of breast: Z12.31

## 2022-12-17 NOTE — Assessment & Plan Note (Deleted)
Chronic.  Stable. Continue ASA 81 mg daily Continue Lipitor 40 mg daily Follows with cardiology

## 2022-12-17 NOTE — Assessment & Plan Note (Signed)
Chronic.  Stable. Continue ASA 81 mg daily Continue Lipitor 40 mg daily Follows with cardiology

## 2022-12-17 NOTE — Assessment & Plan Note (Signed)
Chronic.   Currently on statin and tolerating well. Continue Lipitor 40 mg daily Follows with cardiology

## 2022-12-17 NOTE — Assessment & Plan Note (Signed)
Chronic.  Stable.  Takes albuterol as needed.  Not currently taking an oral Ellipta. Follows with pulmonology at [facility]

## 2022-12-20 NOTE — Telephone Encounter (Signed)
LVM for patient to call back.   Claire Thomas,cma  

## 2023-01-01 ENCOUNTER — Encounter: Payer: Medicare Other | Admitting: Family Medicine

## 2023-02-25 ENCOUNTER — Encounter: Payer: Self-pay | Admitting: Family Medicine

## 2023-02-25 ENCOUNTER — Other Ambulatory Visit: Payer: Self-pay | Admitting: Family Medicine

## 2023-02-25 ENCOUNTER — Other Ambulatory Visit (INDEPENDENT_AMBULATORY_CARE_PROVIDER_SITE_OTHER): Payer: Medicare Other

## 2023-02-25 DIAGNOSIS — E559 Vitamin D deficiency, unspecified: Secondary | ICD-10-CM

## 2023-02-25 DIAGNOSIS — I10 Essential (primary) hypertension: Secondary | ICD-10-CM

## 2023-02-25 DIAGNOSIS — E785 Hyperlipidemia, unspecified: Secondary | ICD-10-CM

## 2023-02-25 DIAGNOSIS — I25118 Atherosclerotic heart disease of native coronary artery with other forms of angina pectoris: Secondary | ICD-10-CM

## 2023-02-25 LAB — COMPREHENSIVE METABOLIC PANEL
ALT: 13 U/L (ref 0–35)
AST: 13 U/L (ref 0–37)
Albumin: 4.2 g/dL (ref 3.5–5.2)
Alkaline Phosphatase: 72 U/L (ref 39–117)
BUN: 18 mg/dL (ref 6–23)
CO2: 28 mEq/L (ref 19–32)
Calcium: 9.6 mg/dL (ref 8.4–10.5)
Chloride: 104 mEq/L (ref 96–112)
Creatinine, Ser: 0.91 mg/dL (ref 0.40–1.20)
GFR: 63.45 mL/min (ref >60.00)
Glucose, Bld: 80 mg/dL (ref 70–99)
Potassium: 4.1 mEq/L (ref 3.5–5.1)
Sodium: 141 mEq/L (ref 135–145)
Total Bilirubin: 0.4 mg/dL (ref 0.2–1.2)
Total Protein: 6.8 g/dL (ref 6.0–8.3)

## 2023-02-25 LAB — CBC WITH DIFFERENTIAL/PLATELET
Basophils Absolute: 0 10*3/uL (ref 0.0–0.1)
Basophils Relative: 0.3 % (ref 0.0–3.0)
Eosinophils Absolute: 0.1 10*3/uL (ref 0.0–0.7)
Eosinophils Relative: 1.1 % (ref 0.0–5.0)
HCT: 38.4 % (ref 36.0–46.0)
Hemoglobin: 13.2 g/dL (ref 12.0–15.0)
Lymphocytes Relative: 32 % (ref 12.0–46.0)
Lymphs Abs: 2.5 10*3/uL (ref 0.7–4.0)
MCHC: 34.4 g/dL (ref 30.0–36.0)
MCV: 94.3 fl (ref 78.0–100.0)
Monocytes Absolute: 0.6 10*3/uL (ref 0.1–1.0)
Monocytes Relative: 7.6 % (ref 3.0–12.0)
Neutro Abs: 4.6 10*3/uL (ref 1.4–7.7)
Neutrophils Relative %: 59 % (ref 43.0–77.0)
Platelets: 305 10*3/uL (ref 150.0–400.0)
RBC: 4.08 Mil/uL (ref 3.87–5.11)
RDW: 12.9 % (ref 11.5–15.5)
WBC: 7.8 10*3/uL (ref 4.0–10.5)

## 2023-02-25 LAB — VITAMIN B12: Vitamin B-12: 250 pg/mL (ref 211–911)

## 2023-02-25 LAB — VITAMIN D 25 HYDROXY (VIT D DEFICIENCY, FRACTURES): VITD: 24.53 ng/mL — ABNORMAL LOW (ref 30.00–100.00)

## 2023-02-25 LAB — TSH: TSH: 2.09 u[IU]/mL (ref 0.35–5.50)

## 2023-02-25 LAB — HEMOGLOBIN A1C: Hgb A1c MFr Bld: 5.7 % (ref 4.6–6.5)

## 2023-02-25 MED ORDER — VITAMIN D (ERGOCALCIFEROL) 1.25 MG (50000 UNIT) PO CAPS: 50000.0000 [IU] | ORAL_CAPSULE | ORAL | 2 refills | Status: DC

## 2023-02-25 NOTE — Addendum Note (Signed)
Addended by: Leeanne Rio on: 02/25/2023 04:32 PM   Modules accepted: Orders

## 2023-02-26 ENCOUNTER — Other Ambulatory Visit (INDEPENDENT_AMBULATORY_CARE_PROVIDER_SITE_OTHER): Payer: Medicare Other

## 2023-02-26 DIAGNOSIS — E785 Hyperlipidemia, unspecified: Secondary | ICD-10-CM | POA: Diagnosis not present

## 2023-02-26 LAB — LIPID PANEL
Cholesterol: 172 mg/dL (ref 0–200)
HDL: 56.6 mg/dL (ref >39.00)
LDL Cholesterol: 88 mg/dL (ref 0–99)
NonHDL: 115.58
Total CHOL/HDL Ratio: 3
Triglycerides: 138 mg/dL (ref 0.0–149.0)
VLDL: 27.6 mg/dL (ref 0.0–40.0)

## 2023-03-04 ENCOUNTER — Ambulatory Visit (INDEPENDENT_AMBULATORY_CARE_PROVIDER_SITE_OTHER): Payer: Medicare Other | Admitting: Family Medicine

## 2023-03-04 ENCOUNTER — Encounter: Payer: Self-pay | Admitting: Family Medicine

## 2023-03-04 ENCOUNTER — Ambulatory Visit
Admission: RE | Admit: 2023-03-04 | Discharge: 2023-03-04 | Disposition: A | Discharge status: Home / Self Care | Payer: Medicare Other | Source: Ambulatory Visit | Attending: Family Medicine | Admitting: Family Medicine

## 2023-03-04 VITALS — BP 120/78 | HR 59 | Temp 98.1°F | Ht 64.0 in | Wt 180.2 lb

## 2023-03-04 DIAGNOSIS — Z1231 Encounter for screening mammogram for malignant neoplasm of breast: Secondary | ICD-10-CM | POA: Diagnosis not present

## 2023-03-04 DIAGNOSIS — I25118 Atherosclerotic heart disease of native coronary artery with other forms of angina pectoris: Secondary | ICD-10-CM | POA: Diagnosis not present

## 2023-03-04 DIAGNOSIS — E559 Vitamin D deficiency, unspecified: Secondary | ICD-10-CM | POA: Diagnosis not present

## 2023-03-04 DIAGNOSIS — I10 Essential (primary) hypertension: Secondary | ICD-10-CM

## 2023-03-04 DIAGNOSIS — J432 Centrilobular emphysema: Secondary | ICD-10-CM | POA: Diagnosis not present

## 2023-03-04 DIAGNOSIS — K580 Irritable bowel syndrome with diarrhea: Secondary | ICD-10-CM | POA: Diagnosis not present

## 2023-03-04 NOTE — Progress Notes (Signed)
SUBJECTIVE:   Chief Complaint  Patient presents with   Medical Management of Chronic Issues    3 mo. Follow up. ng   HPI Presents to clinic for 86-monthfollow-up for chronic disease management.  Hypertension Asymptomatic.  Hypertension blood pressure readings at home range 120-150/70-80s.  Denies any visual changes, headaches, chest pain, shortness of breath or lower extremity edema.  Hyperlipidemia Recent LDL at goal.  Has not taken statin since last visit.  Would prefer not to restart medication.  Reviewed calcium score from 10/2021, score of 328, 88th percentile for age and gender.    IBS Reports recent colonoscopy 01/23 showed multiple polyps.  Repeat recommended in 6 months.  Patient reports continues to have diarrhea and unable to make to bathroom.  Has tried Imodium in the past which did not work.  Previous GI provider recommended medication that was not covered under insurance.  [facility]   PERTINENT PMH / PSH: Hypertension Hyperlipidemia COPD OSA   OBJECTIVE:  BP 120/78   Pulse (!) 59   Temp 98.1 F (36.7 C) (Oral)   Ht '5\' 4"'$  (1.626 m)   Wt 180 lb 3.2 oz (81.7 kg)   SpO2 97%   BMI 30.93 kg/m    Physical Exam Vitals reviewed.  Constitutional:      General: She is not in acute distress.    Appearance: She is normal weight. She is not ill-appearing.  HENT:     Head: Normocephalic.  Eyes:     Conjunctiva/sclera: Conjunctivae normal.  Cardiovascular:     Rate and Rhythm: Normal rate and regular rhythm.     Pulses: Normal pulses.  Pulmonary:     Effort: Pulmonary effort is normal.     Breath sounds: Normal breath sounds.  Abdominal:     General: Bowel sounds are normal.  Neurological:     Mental Status: She is alert. Mental status is at baseline.  Psychiatric:        Mood and Affect: Mood normal.        Behavior: Behavior normal.        Thought Content: Thought content normal.        Judgment: Judgment normal.      ASSESSMENT/PLAN:  Essential hypertension Assessment & Plan: Chronic.  Asymptomatic.  Well-controlled without antihypertensives. Continue to monitor blood pressure at home, goal less than 140/90 per JNC 8 guidelines for age. Limit salt intake Follow-up 6 months    Centrilobular emphysema ([facility]) Assessment & Plan: Chronic.  Stable.  Using Ellipta as needed [facility]  Vitamin D deficiency Assessment & Plan: Low vitamin D Continue vitamin D 1.25 mg weekly   Coronary artery disease of native artery of native heart with stable angina pectoris ([facility] Assessment & Plan: Chronic.  Stable.  11/09/2021 calcium score 328, 80% for age and gender. Continue ASA 81 mg daily Continue Lipitor 40 mg daily Follows with cardiology   Irritable bowel syndrome with diarrhea Assessment & Plan: Chronic.  Stable.  Currently diarrhea. Not improved with Imodium Start Metamucil daily Increase soluble fiber in diet. Could consider bile acid sequestrant.  Can follow-up with GI if no improvement       HCM Mammogram up-to-date Colonoscopy 01/01/2022.  Multiple polyps at that time.  Recommend repeat colonoscopy in 6 months.  [place]. Recommend shingles vaccine Recommend Tdap Medicare wellness visit due Recommend pneumonia 20 vaccine   PDMP  reviewed  Return in about 6 months (around 09/04/2023) for PCP.  Carollee Leitz, MD

## 2023-03-04 NOTE — Patient Instructions (Addendum)
It was a pleasure meeting you today. Thank you for allowing me to take part in your health care.  Our goals for today as we discussed include:  Previous calcium score elevated.  Would recommend continuing Lipitor 40 mg daily and aspirin 81 mg daily.  Recommend Shingles vaccine.  This is a 2 dose series and can be given at your local pharmacy.  Please talk to your pharmacist about this.   Recommend Tetanus Vaccination.  This is given every 10 years.   Medicare annual wellness visit due.  Please schedule appointment Tuesday/Thursday afternoons  Add Metamucil daily for IBS.  If no improvement follow-up with GI  Continue to monitor blood pressure at home.  If greater than 140/90 consecutively please schedule appointment with MD prior to 19-monthfollow-up.  Follow-up in 6 months for blood pressure check  If you have any questions or concerns, please do not hesitate to call the office at [phone]  I look forward to our next visit and until then take care and stay safe.  Regards,   TCarollee Leitz MD   [facility]

## 2023-03-04 NOTE — Assessment & Plan Note (Signed)
Chronic.  Asymptomatic.  Well-controlled without antihypertensives. Continue to monitor blood pressure at home, goal less than 140/90 per JNC 8 guidelines for age. Limit salt intake Follow-up 6 months

## 2023-03-04 NOTE — Assessment & Plan Note (Signed)
Low vitamin D Continue vitamin D 1.25 mg weekly

## 2023-03-04 NOTE — Assessment & Plan Note (Signed)
Chronic.  Stable.  Using Ellipta as needed Follows with pulmonology at [facility]

## 2023-03-04 NOTE — Assessment & Plan Note (Signed)
Chronic.  Stable.  Currently diarrhea. Not improved with Imodium Start Metamucil daily Increase soluble fiber in diet. Could consider bile acid sequestrant.  Can follow-up with GI if no improvement

## 2023-03-04 NOTE — Assessment & Plan Note (Signed)
Chronic.  Stable.  11/09/2021 calcium score 328, 80% for age and gender. Continue ASA 81 mg daily Continue Lipitor 40 mg daily Follows with cardiology

## 2023-03-05 ENCOUNTER — Encounter: Payer: Self-pay | Admitting: Cardiology

## 2023-03-05 DIAGNOSIS — R195 Other fecal abnormalities: Secondary | ICD-10-CM | POA: Diagnosis not present

## 2023-03-05 DIAGNOSIS — K219 Gastro-esophageal reflux disease without esophagitis: Secondary | ICD-10-CM | POA: Diagnosis not present

## 2023-03-07 ENCOUNTER — Ambulatory Visit (INDEPENDENT_AMBULATORY_CARE_PROVIDER_SITE_OTHER): Payer: Medicare Other | Admitting: *Deleted

## 2023-03-07 ENCOUNTER — Encounter: Payer: Self-pay | Admitting: *Deleted

## 2023-03-07 VITALS — BP 120/78 | Ht 64.0 in | Wt 180.2 lb

## 2023-03-07 DIAGNOSIS — Z0000 Encounter for general adult medical examination without abnormal findings: Secondary | ICD-10-CM

## 2023-03-07 NOTE — Patient Instructions (Signed)
  Ms. Kates , Thank you for taking time to come for your Medicare Wellness Visit. I appreciate your ongoing commitment to your health goals. Please review the following plan we discussed and let me know if I can assist you in the future.   These are the goals we discussed:  Goals   None     This is a list of the screening recommended for you and due dates:  Health Maintenance  Topic Date Due   Zoster (Shingles) Vaccine (1 of 2) Never done   DTaP/Tdap/Td vaccine (4 - Td or Tdap) 03/05/2021   Pneumonia Vaccine (2 of 2 - PPSV23 or PCV20) 03/15/2023*   COVID-19 Vaccine (1) 03/20/2023*   Flu Shot  03/24/2023*   Medicare Annual Wellness Visit  03/06/2024   Mammogram  03/03/2025   Colon Cancer Screening  01/01/2027   DEXA scan (bone density measurement)  Completed   Hepatitis C Screening: USPSTF Recommendation to screen - Ages 18-79 yo.  Completed   HPV Vaccine  Aged Out  *Topic was postponed. The date shown is not the original due date.

## 2023-03-07 NOTE — Progress Notes (Signed)
I connected with  Claire Thomas on 03/07/23 by a audio enabled telemedicine application and verified that I am speaking with the correct person using two identifiers.  Patient Location: Home  Provider Location: Office/Clinic  I discussed the limitations of evaluation and management by telemedicine. The patient expressed understanding and agreed to proceed.   Subjective:   Claire Thomas is a 72 y.o. female who presents for an Initial Medicare Annual Wellness Visit.         Objective:    Today's Vitals   03/07/23 1548  BP: 120/78  Weight: 180 lb 3.2 oz (81.7 kg)  Height: '5\' 4"'$  (1.626 m)   Body mass index is 30.93 kg/m.     03/07/2023    3:55 PM 05/22/2022    8:37 AM 01/01/2022    9:05 AM 07/27/2021    2:40 PM  Advanced Directives  Does Patient Have a Medical Advance Directive? Yes No Yes Yes  Type of Advance Directive Healthcare Power of [place]   Does patient want to make changes to medical advance directive? No - Patient declined     Copy of [place] in Chart? No - copy requested     Would patient like information on creating a medical advance directive?  No - Patient declined      Current Medications (verified) Outpatient Encounter Medications as of 03/07/2023  Medication Sig   aspirin EC 81 MG tablet Take 1 tablet (81 mg total) by mouth daily. Swallow whole.   atorvastatin (LIPITOR) 40 MG tablet Take 1 tablet (40 mg total) by mouth daily.   Calcium Carb-Cholecalciferol (CALCIUM/VITAMIN D) 600-400 MG-UNIT TABS Take 2 tablets by mouth daily.   pantoprazole (PROTONIX) 40 MG tablet TAKE 1 TABLET BY MOUTH EVERY DAY   umeclidinium-vilanterol (ANORO ELLIPTA) 62.5-25 MCG/ACT AEPB Inhale 1 puff into the lungs daily.   Vitamin D, Ergocalciferol, (DRISDOL) 1.25 MG (50000 UNIT) CAPS capsule Take 1 capsule (50,000 Units total) by mouth every 7 (seven) days.   No facility-administered encounter medications on file as of  03/07/2023.    Allergies (verified) Patient has no known allergies.   History: Past Medical History:  Diagnosis Date   Arthritis    Asthma    worse with exertion    At risk for caregiver role strain 02/09/2021   Breast cancer screening by mammogram 12/17/2022   Chest tightness 06/18/2014   Chicken pox    Colon polyps    Dysphagia 02/25/2018   Edema, peripheral 03/04/2014   Essential tremor 08/10/2020   Fatigue 05/25/2021   Frequent headaches    GERD (gastroesophageal reflux disease)    GERD without esophagitis 03/21/2009   Qualifier: Diagnosis of   By: Diona Browner MD, Amy       IBS (irritable bowel syndrome)    diarrhea type   IBS (irritable bowel syndrome) 03/21/2009   Qualifier: Diagnosis of   By: Diona Browner MD, Amy       Left hip pain 10/22/2018   Nonobstructive CAD (coronary artery disease)    a. 06/2014 St echo: ex 8:13 (10.1 mets), moderate apical ant/mid anteroseptal HK; b. 06/2014 Cath: mild luminal irregularities, otw nl cors. EF 60%.   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   Past Surgical History:  Procedure Laterality Date   ABDOMINAL HYSTERECTOMY     1976 DUB, pelvic pain ovaries intact    CARDIAC CATHETERIZATION  07/19/2014   [facility]   CARPAL TUNNEL RELEASE Bilateral    COLONOSCOPY     COLONOSCOPY N/A 01/01/2022   Procedure: COLONOSCOPY;  Surgeon: Annamaria Helling, DO;  Location[facility] ENDOSCOPY;  Service: Gastroenterology;  Laterality: N/A;   ESOPHAGOGASTRODUODENOSCOPY     ESOPHAGOGASTRODUODENOSCOPY N/A 01/01/2022    Procedure: ESOPHAGOGASTRODUODENOSCOPY (EGD);  Surgeon: Annamaria Helling, DO;  Location: [facility] ENDOSCOPY;  Service: Gastroenterology;  Laterality: N/A;   KNEE ARTHROSCOPY Left 12/25/2004   MENISCUS REPAIR Left    PARTIAL HYSTERECTOMY     UPPER GASTROINTESTINAL ENDOSCOPY     Family History  Problem Relation Age of Onset   Colon polyps Mother    Colon cancer Mother    Hyperlipidemia Mother    Pulmonary fibrosis Mother    Arthritis Mother    Cancer Mother        colon    Breast cancer Mother 58   AAA (abdominal aortic aneurysm) Father    Hyperlipidemia Father    Hypertension Father    Prostate cancer Father    Cancer Father        prostate    Dementia Father        59s as of 01/06/20   Aneurysm Father    Hyperlipidemia Brother    Hypertension Brother    Heart disease Maternal Grandfather    Diabetes Paternal Grandmother    Asthma Paternal Grandfather    Arthritis Son    Esophageal cancer Neg Hx    Stomach cancer Neg Hx    Rectal cancer Neg Hx    Social History   Socioeconomic History   Marital status: Married    Spouse name: Not on file   Number of children: 2   Years of education: Not on file   Highest education level: Not on file  Occupational History   Occupation: Glass blower/designer  Tobacco Use   Smoking status: Never   Smokeless tobacco: Never  Vaping Use   Vaping Use: Never used  Substance and Sexual Activity   Alcohol use: Yes    Alcohol/week: 1.0 standard drink of alcohol    Types: 1 Glasses of wine per week    Comment: occassioinal-wine every 6 months   Drug use: No   Sexual activity: Yes    Comment: men  Other Topics Concern   Not on file  Social History Narrative   Married    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    2 sons    Owns guns, wears seat belt, safe in relationship    Caregiver 25 y.o dad who does not live at home   Brother living    Right handed   Caffeine   Social Determinants of Health   Financial Resource Strain: Low Risk   (03/04/2023)   Overall Financial Resource Strain (CARDIA)    Difficulty of Paying Living Expenses: Not hard at all  Food Insecurity: No Food Insecurity (03/04/2023)   Hunger Vital Sign    Worried About Running Out of Food in the Last Year: Never true  Ran Out of Food in the Last Year: Never true  Transportation Needs: No Transportation Needs (03/04/2023)   PRAPARE - Hydrologist (Medical): No    Lack of Transportation (Non-Medical): No  Physical Activity: Inactive (03/04/2023)   Exercise Vital Sign    Days of Exercise per Week: 0 days    Minutes of Exercise per Session: 0 min  Stress: No Stress Concern Present (03/04/2023)   [place]    Feeling of Stress : Not at all  Social Connections: Unknown (03/07/2023)   Social Connection and Isolation Panel [NHANES]    Frequency of Communication with Friends and Family: More than three times a week    Frequency of Social Gatherings with Friends and Family: Twice a week    Attends Religious Services: Patient declined    Marine scientist or Organizations: No    Attends Music therapist: Never    Marital Status: Married    Tobacco Counseling Counseling given: Not Answered   Clinical Intake:  Pre-visit preparation completed: Yes  Pain : No/denies pain     BMI - recorded: 30.92 Nutritional Status: BMI > 30  Obese Nutritional Risks: None Diabetes: No  How often do you need to have someone help you when you read instructions, pamphlets, or other written materials from your doctor or pharmacy?: 1 - Never  Diabetic? No  Interpreter Needed?: No      Activities of Daily Living    03/04/2023    3:52 PM 03/04/2023    3:04 PM  In your present state of health, do you have any difficulty performing the following activities:  Hearing? 0 0  Vision? 0 0  Difficulty concentrating or making decisions? 0 0  Walking or climbing  stairs? 0 0  Dressing or bathing? 0 0  Doing errands, shopping? 0 0  Preparing Food and eating ? N N  Using the Toilet? N N  In the past six months, have you accidently leaked urine? Y Y  Do you have problems with loss of bowel control? Y Y  Managing your Medications? N N  Managing your Finances? N N  Housekeeping or managing your Housekeeping? N N    Patient Care Team: Carollee Leitz, MD as PCP - General (Family Medicine) Kate Sable, MD as PCP - Cardiology (Cardiology) Pieter Partridge, DO as Consulting Physician (Neurology)  Indicate any recent Medical Services you may have received from other than [facility] providers in the past year (date may be approximate).     Assessment:   This is a routine wellness examination for Claire Thomas.  Hearing/Vision screen No results found.  Dietary issues and exercise activities discussed:     Goals Addressed   None    Depression Screen    03/07/2023    3:51 PM 03/04/2023    1:11 PM 11/28/2022    1:09 PM 03/14/2022    9:12 AM 08/10/2020    9:43 AM 01/06/2020    8:41 AM 10/21/2018    1:47 PM  PHQ 2/9 Scores  PHQ - 2 Score 0 0 0 4 0 0 0  PHQ- 9 Score    14       Fall Risk    03/07/2023    3:51 PM 03/04/2023    3:52 PM 03/04/2023    3:04 PM 03/04/2023    1:11 PM 11/28/2022    1:09 PM  [place] in the past  year? 0 0 0 0 0  Number falls in past yr: 0   0 0  Injury with Fall? 0   0 0  Risk for fall due to : No Fall Risks   No Fall Risks No Fall Risks  Follow up    Falls evaluation completed Falls evaluation completed    [place]:  Any stairs in or around the home? Yes  If so, are there any without handrails? Yes  Home free of loose throw rugs in walkways, pet beds, electrical cords, etc? Yes  Adequate lighting in your home to reduce risk of falls? Yes   ASSISTIVE DEVICES UTILIZED TO PREVENT FALLS:  Life alert? No  Use of a cane, walker or w/c? No  Grab bars in the bathroom? No  Shower  chair or bench in shower? No  Elevated toilet seat or a handicapped toilet? Yes     Cognitive Function:        03/07/2023    3:54 PM  6CIT Screen  What Year? 0 points  What month? 0 points  What time? 0 points  Count back from 20 0 points  Months in reverse 0 points  Repeat phrase 2 points  Total Score 2 points    Immunizations Immunization History  Administered Date(s) Administered   Influenza Whole 10/27/2009, 12/30/2012   Influenza, High Dose Seasonal PF 10/21/2018   Influenza,inj,Quad PF,6+ Mos 11/25/2013   Pneumococcal Conjugate-13 01/06/2019   Td 12/24/2000, 03/06/2011   Tdap 03/09/2010   Zoster, Live 12/30/2012    TDAP status: Due, Education has been provided regarding the importance of this vaccine. Advised may receive this vaccine at local pharmacy or Health Dept. Aware to provide a copy of the vaccination record if obtained from local pharmacy or Health Dept. Verbalized acceptance and understanding.  Flu Vaccine status: Due, Education has been provided regarding the importance of this vaccine. Advised may receive this vaccine at local pharmacy or Health Dept. Aware to provide a copy of the vaccination record if obtained from local pharmacy or Health Dept. Verbalized acceptance and understanding.  Pneumococcal vaccine status: Due, Education has been provided regarding the importance of this vaccine. Advised may receive this vaccine at local pharmacy or Health Dept. Aware to provide a copy of the vaccination record if obtained from local pharmacy or Health Dept. Verbalized acceptance and understanding.  Covid-19 vaccine status: Information provided on how to obtain vaccines.   Qualifies for Shingles Vaccine? No   Zostavax completed No   Shingrix Completed?: No.    Education has been provided regarding the importance of this vaccine. Patient has been advised to call insurance company to determine out of pocket expense if they have not yet received this vaccine.  Advised may also receive vaccine at local pharmacy or Health Dept. Verbalized acceptance and understanding.  Screening Tests Health Maintenance  Topic Date Due   Zoster Vaccines- Shingrix (1 of 2) Never done   DTaP/Tdap/Td (4 - Td or Tdap) 03/05/2021   Pneumonia Vaccine 8+ Years old (2 of 2 - PPSV23 or PCV20) 03/15/2023 (Originally 03/03/2019)   COVID-19 Vaccine (1) 03/20/2023 (Originally 02/06/1952)   INFLUENZA VACCINE  03/24/2023 (Originally 07/24/2022)   Medicare Annual Wellness (AWV)  03/06/2024   MAMMOGRAM  03/03/2025   COLONOSCOPY (Pts 45-19yr Insurance coverage will need to be confirmed)  01/01/2027   DEXA SCAN  Completed   Hepatitis C Screening  Completed   HPV VACCINES  Aged Out    Health Maintenance  Health Maintenance Due  Topic Date Due   Zoster Vaccines- Shingrix (1 of 2) Never done   DTaP/Tdap/Td (4 - Td or Tdap) 03/05/2021    Colorectal cancer screening: Type of screening: Colonoscopy. Completed 01/01/2022. Repeat every 5 years  Mammogram status: Completed 03/04/2023. Repeat every year  Bone Density status: Completed 08/07/2021. Results reflect: Bone density results: OSTEOPENIA. Repeat every 2 years.  Lung Cancer Screening: (Low Dose CT Chest recommended if Age 16-80 years, 30 pack-year currently smoking OR have quit w/in 15years.) does not qualify.     Additional Screening:  Hepatitis C Screening: does not qualify  Vision Screening: Recommended annual ophthalmology exams for early detection of glaucoma and other disorders of the eye. Is the patient up to date with their annual eye exam?  Yes  Who is the provider or what is the name of the office in which the patient attends annual eye exams?  [facility] If pt is not established with a provider, would they like to be referred to a provider to establish care? No .   Dental Screening: Recommended annual dental exams for proper oral hygiene  Community Resource Referral / Chronic Care Management: CRR  required this visit?  No   CCM required this visit?  No      Plan:     I have personally reviewed and noted the following in the patient's chart:   Medical and social history Use of alcohol, tobacco or illicit drugs  Current medications and supplements including opioid prescriptions. Patient is not currently taking opioid prescriptions. Functional ability and status Nutritional status Physical activity Advanced directives List of other physicians Hospitalizations, surgeries, and ER visits in previous 12 months Vitals Screenings to include cognitive, depression, and falls Referrals and appointments  In addition, I have reviewed and discussed with patient certain preventive protocols, quality metrics, and best practice recommendations. A written personalized care plan for preventive services as well as general preventive health recommendations were provided to patient.     Cannon Kettle, [place]   03/07/2023   Nurse Notes: Total time spent on telephone visit today was 15 minutes.

## 2023-03-11 ENCOUNTER — Encounter: Payer: Self-pay | Admitting: Cardiology

## 2023-03-11 ENCOUNTER — Ambulatory Visit: Payer: Medicare Other | Attending: Cardiology | Admitting: Cardiology

## 2023-03-11 VITALS — BP 144/92 | HR 60 | Ht 64.0 in | Wt 181.0 lb

## 2023-03-11 DIAGNOSIS — E7800 Pure hypercholesterolemia, unspecified: Secondary | ICD-10-CM

## 2023-03-11 DIAGNOSIS — K449 Diaphragmatic hernia without obstruction or gangrene: Secondary | ICD-10-CM

## 2023-03-11 DIAGNOSIS — I2510 Atherosclerotic heart disease of native coronary artery without angina pectoris: Secondary | ICD-10-CM | POA: Diagnosis not present

## 2023-03-11 MED ORDER — PANTOPRAZOLE SODIUM 40 MG PO TBEC: 40.0000 mg | DELAYED_RELEASE_TABLET | Freq: Two times a day (BID) | ORAL | 3 refills | Status: DC

## 2023-03-11 MED ORDER — METOPROLOL TARTRATE 100 MG PO TABS: 100.0000 mg | ORAL_TABLET | Freq: Once | ORAL | 0 refills | Status: DC

## 2023-03-11 NOTE — Progress Notes (Signed)
Cardiology Office Note:    Date:  03/11/2023   ID:  Claire Thomas, DOB 08/30/1951, MRN [id number]  PCP:  Carollee Leitz, MD   [facility] Providers Cardiologist:  Kate Sable, MD     Referring MD: Carollee Leitz, MD   Chief Complaint  Patient presents with   Follow-up    Overdue annual f/u, Chest tightness 2x months,     History of Present Illness:    Claire Thomas is a 72 y.o. female with a hx of nonobstructive CAD (moderate prox LAD stenosis), hyperlipidemia, hiatal hernia, presenting with epigastric pain.    Last seen about 2 years ago with symptoms of chest pain, coronary CT showed moderate proximal LAD disease.  Hiatal hernia also noted.  Started on Protonix.  Over the past 2 months, she describes epigastric pain, sometimes radiating to her left neck.  Compliant with Protonix as prescribed.  Also takes aspirin.  Recently more compliant with Lipitor.   Prior notes  echocardiogram 05/2021 showed preserved ejection fraction EF 55 to 60%, impaired relaxation.   Coronary CT 10/2021 moderate proximal LAD stenosis.  Calcium score 328.  Past Medical History:  Diagnosis Date   Arthritis    Asthma    worse with exertion    At risk for caregiver role strain 02/09/2021   Breast cancer screening by mammogram 12/17/2022   Chest tightness 06/18/2014   Chicken pox    Colon polyps    Dysphagia 02/25/2018   Edema, peripheral 03/04/2014   Essential tremor 08/10/2020   Fatigue 05/25/2021   Frequent headaches    GERD (gastroesophageal reflux disease)    GERD without esophagitis 03/21/2009   Qualifier: Diagnosis of   By: Diona Browner MD, Amy       IBS (irritable bowel syndrome)    diarrhea type   IBS (irritable bowel syndrome) 03/21/2009   Qualifier: Diagnosis of   By: Diona Browner MD, Amy       Left hip pain 10/22/2018   Nonobstructive CAD (coronary artery disease)    a. 06/2014 St echo: ex 8:13 (10.1 mets), moderate apical ant/mid anteroseptal HK; b. 06/2014 Cath: mild luminal  irregularities, otw nl cors. EF 60%.   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    Past Surgical History:  Procedure Laterality Date   ABDOMINAL HYSTERECTOMY     1976 DUB, pelvic pain ovaries intact    CARDIAC CATHETERIZATION  07/19/2014   [facility]   CARPAL TUNNEL RELEASE Bilateral    COLONOSCOPY     COLONOSCOPY N/A 01/01/2022   Procedure: COLONOSCOPY;  Surgeon: Annamaria Helling, DO;  Location: [facility] ENDOSCOPY;  Service: Gastroenterology;  Laterality: N/A;   ESOPHAGOGASTRODUODENOSCOPY     ESOPHAGOGASTRODUODENOSCOPY N/A 01/01/2022   Procedure: ESOPHAGOGASTRODUODENOSCOPY (EGD);  Surgeon: Annamaria Helling, DO;  Location: [facility] ENDOSCOPY;  Service: Gastroenterology;  Laterality: N/A;   KNEE ARTHROSCOPY Left 12/25/2004   MENISCUS REPAIR Left    PARTIAL HYSTERECTOMY     UPPER GASTROINTESTINAL ENDOSCOPY  Current Medications: Current Meds  Medication Sig   aspirin EC 81 MG tablet Take 1 tablet (81 mg total) by mouth daily. Swallow whole.   atorvastatin (LIPITOR) 40 MG tablet Take 1 tablet (40 mg total) by mouth daily.   Calcium Carb-Cholecalciferol (CALCIUM/VITAMIN D) 600-400 MG-UNIT TABS Take 2 tablets by mouth daily.   metoprolol tartrate (LOPRESSOR) 100 MG tablet Take 1 tablet (100 mg total) by mouth once for 1 dose. TWO HOURS PRIOR TO CARDIAC CTA   umeclidinium-vilanterol (ANORO ELLIPTA) 62.5-25 MCG/ACT AEPB Inhale 1 puff into the lungs daily.   Vitamin D, Ergocalciferol, (DRISDOL) 1.25 MG (50000 UNIT) CAPS capsule Take 1 capsule (50,000  Units total) by mouth every 7 (seven) days.   [DISCONTINUED] pantoprazole (PROTONIX) 40 MG tablet TAKE 1 TABLET BY MOUTH EVERY DAY     Allergies:   Patient has no known allergies.   Social History   Socioeconomic History   Marital status: Married    Spouse name: Not on file   Number of children: 2   Years of education: Not on file   Highest education level: Not on file  Occupational History   Occupation: Glass blower/designer  Tobacco Use   Smoking status: Never    Passive exposure: Past   Smokeless tobacco: Never   Tobacco comments:    Both parents were smokers   Vaping Use   Vaping Use: Never used  Substance and Sexual Activity   Alcohol use: Yes    Alcohol/week: 1.0 standard drink of alcohol    Types: 1 Glasses of wine per week    Comment: occassioinal-wine every 6 months   Drug use: No   Sexual activity: Yes    Comment: men  Other Topics Concern   Not on file  Social History Narrative   Married    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    2 sons    Owns guns, wears seat belt, safe in relationship    Caregiver 44 y.o dad who does not live at home   Brother living    Right handed   Caffeine   Social Determinants of Health   Financial Resource Strain: Low Risk  (03/04/2023)   Overall Financial Resource Strain (CARDIA)    Difficulty of Paying Living Expenses: Not hard at all  Food Insecurity: No Food Insecurity (03/04/2023)   Hunger Vital Sign    Worried About Running Out of Food in the Last Year: Never true    [place] in the Last Year: Never true  Transportation Needs: No Transportation Needs (03/04/2023)   PRAPARE - Hydrologist (Medical): No    Lack of Transportation (Non-Medical): No  Physical Activity: Inactive (03/04/2023)   Exercise Vital Sign    Days of Exercise per Week: 0 days    Minutes of Exercise per Session: 0 min  Stress: No Stress Concern Present (03/04/2023)   [place]    Feeling of Stress : Not at all  Social Connections: Unknown (03/07/2023)   Social Connection and Isolation Panel [NHANES]    Frequency of Communication with Friends and Family: More than three times a week    Frequency of Social Gatherings with Friends and Family: Twice a week    Attends Religious Services: Patient declined    Marine scientist or Organizations: No    Attends Archivist Meetings: Never    Marital Status: Married     Family History:  The patient's family history includes AAA (abdominal aortic aneurysm) in her father; Aneurysm in her father; Arthritis in her mother and son; Asthma in her paternal grandfather; Breast cancer (age of onset: 20) in her mother; Cancer in her father and mother; Colon cancer in her mother; Colon polyps in her mother; Dementia in her father; Diabetes in her paternal grandmother; Heart disease in her maternal grandfather; Hyperlipidemia in her brother, father, and mother; Hypertension in her brother and father; Prostate cancer in her father; Pulmonary fibrosis in her mother. There is no history of Esophageal cancer, Stomach cancer, or Rectal cancer.  ROS:   Please see the history of present illness.     All other systems reviewed and are negative.  EKGs/Labs/Other Studies Reviewed:    The following studies were reviewed today:   EKG:  EKG is ordered today.  EKG shows normal sinus rhythm, normal ECG.  Recent Labs: 02/25/2023: ALT 13; BUN 18; Creatinine, Ser 0.91; Hemoglobin 13.2; Platelets 305.0; Potassium 4.1; Sodium 141; TSH 2.09  Recent Lipid Panel    Component Value Date/Time   CHOL 172 02/26/2023 0745   TRIG 138.0 02/26/2023 0745   HDL 56.60 02/26/2023 0745   CHOLHDL 3 02/26/2023 0745   VLDL 27.6 02/26/2023 0745   LDLCALC 88 02/26/2023 0745   LDLDIRECT 143.0 03/06/2011 0946     Risk Assessment/Calculations:          Physical Exam:    VS:  BP (!) 144/92 (BP Location: Left Arm, Patient  Position: Sitting, Cuff Size: Normal)   Pulse 60   Ht 5\' 4"  (1.626 m)   Wt 181 lb (82.1 kg)   SpO2 98%   BMI 31.07 kg/m     Wt Readings from Last 3 Encounters:  03/11/23 181 lb (82.1 kg)  03/07/23 180 lb 3.2 oz (81.7 kg)  03/04/23 180 lb 3.2 oz (81.7 kg)     GEN:  Well nourished, well developed in no acute distress HEENT: Normal NECK: No JVD; No carotid bruits CARDIAC: RRR, no murmurs, rubs, gallops RESPIRATORY:  Clear to auscultation without rales, wheezing or rhonchi  ABDOMEN: Soft, non-tender, non-distended MUSCULOSKELETAL:  No edema; No deformity  SKIN: Warm and dry NEUROLOGIC:  Alert and oriented x 3 PSYCHIATRIC:  Normal affect   ASSESSMENT:    1. Coronary artery disease involving native coronary artery of native heart, unspecified whether angina present   2. Pure hypercholesterolemia   3. Hiatal hernia     PLAN:    In order of problems listed above:  Chest pain, history of moderate CAD.  echo 05/2021 EF 55 to 60%, impaired relaxation.  Repeat echo, coronary CTA to evaluate any worsening disease.  Continue aspirin 81, Lipitor 40. Hyperlipidemia, continue Lipitor 40 mg daily.  Hiatal hernia noted on chest CT. symptoms could be GERD related.  Increase Protonix to 40 mg twice daily.  Follow-up in after cardiac testing.    Medication Adjustments/Labs and Tests Ordered: Current medicines are reviewed at length with the patient today.  Concerns regarding medicines are outlined above.  Orders Placed This Encounter  Procedures   CT CORONARY MORPH W/CTA COR W/SCORE W/CA W/CM &/OR WO/CM   EKG 12-Lead   ECHOCARDIOGRAM COMPLETE     Meds ordered this encounter  Medications   metoprolol tartrate (LOPRESSOR) 100 MG tablet    Sig: Take 1 tablet (100 mg total) by mouth once for 1 dose. TWO HOURS PRIOR TO CARDIAC CTA    Dispense:  1 tablet    Refill:  0  pantoprazole (PROTONIX) 40 MG tablet    Sig: Take 1 tablet (40 mg total) by mouth 2 (two) times daily.    Dispense:   90 tablet    Refill:  3      Patient Instructions  Medication Instructions:   INCREASE Protonix - take one tablet ( 40 mg) by mouth twice a day.   *If you need a refill on your cardiac medications before your next appointment, please call your pharmacy*   Lab Work:  Your physician recommends you go to the medical mall for lab work.   If you have labs (blood work) drawn today and your tests are completely normal, you will receive your results only by: [place] (if you have MyChart) OR A paper copy in the mail If you have any lab test that is abnormal or we need to change your treatment, we will call you to review the results.   Testing/Procedures:  Your physician has requested that you have an echocardiogram. Echocardiography is a painless test that uses sound waves to create images of your heart. It provides your doctor with information about the size and shape of your heart and how well your heart's chambers and valves are working. This procedure takes approximately one hour. There are no restrictions for this procedure. Please do NOT wear cologne, perfume, aftershave, or lotions (deodorant is allowed). Please arrive 15 minutes prior to your appointment time.     Your cardiac CT will be scheduled on April 8th 2024 @ 10:15am  [facility] [address] [place], [place] [address] [phone]  If scheduled at [facility] or [facility], please arrive 15 mins early for check-in and test prep.   Please follow these instructions carefully (unless otherwise directed):  On the Night Before the Test: Be sure to Drink plenty of water. Do not consume any caffeinated/decaffeinated beverages or chocolate 12 hours prior to your test. Do not take any antihistamines 12 hours prior to your test.  On the Day of the Test: Drink plenty of water until 1 hour prior to the test. Do not  eat any food 1 hour prior to test. You may take your regular medications prior to the test.  Take metoprolol (Lopressor) two hours prior to test. FEMALES- please wear underwire-free bra if available, avoid dresses & tight clothing       After the Test: Drink plenty of water. After receiving IV contrast, you may experience a mild flushed feeling. This is normal. On occasion, you may experience a mild rash up to 24 hours after the test. This is not dangerous. If this occurs, you can take Benadryl 25 mg and increase your fluid intake. If you experience trouble breathing, this can be serious. If it is severe call 911 IMMEDIATELY. If it is mild, please call our office.  Follow-Up: At [facility], you and your health needs are our priority.  As part of our continuing mission to provide you with exceptional heart care, we have created designated Provider Care Teams.  These Care Teams include your primary Cardiologist (physician) and Advanced Practice Providers (APPs -  Physician Assistants and Nurse Practitioners) who all work together to provide you with the care you need, when you need it.  We recommend signing up for the patient portal called "MyChart".  Sign up information is provided on this After Visit Summary.  MyChart is used to connect with patients for Virtual Visits (Telemedicine).  Patients are  able to view lab/test results, encounter notes, upcoming appointments, etc.  Non-urgent messages can be sent to your provider as well.   To learn more about what you can do with MyChart, go to [email or web address].    Your next appointment:    After Cardiac CT  Provider:   You may see Kate Sable, MD or one of the following Advanced Practice Providers on your designated Care Team:   Murray Hodgkins, NP Christell Faith, PA-C Cadence Kathlen Mody, PA-C Gerrie Nordmann, NP   Signed, Kate Sable, MD  03/11/2023 10:29 AM    [place]

## 2023-03-11 NOTE — Patient Instructions (Addendum)
Medication Instructions:   INCREASE Protonix - take one tablet ( 40 mg) by mouth twice a day.   *If you need a refill on your cardiac medications before your next appointment, please call your pharmacy*   Lab Work:  Your physician recommends you go to the medical mall for lab work.   If you have labs (blood work) drawn today and your tests are completely normal, you will receive your results only by: [place] (if you have MyChart) OR A paper copy in the mail If you have any lab test that is abnormal or we need to change your treatment, we will call you to review the results.   Testing/Procedures:  Your physician has requested that you have an echocardiogram. Echocardiography is a painless test that uses sound waves to create images of your heart. It provides your doctor with information about the size and shape of your heart and how well your heart's chambers and valves are working. This procedure takes approximately one hour. There are no restrictions for this procedure. Please do NOT wear cologne, perfume, aftershave, or lotions (deodorant is allowed). Please arrive 15 minutes prior to your appointment time.     Your cardiac CT will be scheduled on April 8th 2024 @ 10:15am  [facility] [address] [place], [place] [address] [phone]  If scheduled at [facility] or [facility], please arrive 15 mins early for check-in and test prep.   Please follow these instructions carefully (unless otherwise directed):  On the Night Before the Test: Be sure to Drink plenty of water. Do not consume any caffeinated/decaffeinated beverages or chocolate 12 hours prior to your test. Do not take any antihistamines 12 hours prior to your test.  On the Day of the Test: Drink plenty of water until 1 hour prior to the test. Do not eat any food 1 hour prior to test. You may take your  regular medications prior to the test.  Take metoprolol (Lopressor) two hours prior to test. FEMALES- please wear underwire-free bra if available, avoid dresses & tight clothing       After the Test: Drink plenty of water. After receiving IV contrast, you may experience a mild flushed feeling. This is normal. On occasion, you may experience a mild rash up to 24 hours after the test. This is not dangerous. If this occurs, you can take Benadryl 25 mg and increase your fluid intake. If you experience trouble breathing, this can be serious. If it is severe call 911 IMMEDIATELY. If it is mild, please call our office.  Follow-Up: At [facility], you and your health needs are our priority.  As part of our continuing mission to provide you with exceptional heart care, we have created designated Provider Care Teams.  These Care Teams include your primary Cardiologist (physician) and Advanced Practice Providers (APPs -  Physician Assistants and Nurse Practitioners) who all work together to provide you with the care you need, when you need it.  We recommend signing up for the patient portal called "MyChart".  Sign up information is provided on this After Visit Summary.  MyChart is used to connect with patients for Virtual Visits (Telemedicine).  Patients are able to view lab/test results, encounter notes, upcoming appointments, etc.  Non-urgent messages can be sent to your provider as well.   To learn more about what you can do with MyChart, go to [email or web address].    Your next appointment:  After Cardiac CT  Provider:   You may see Kate Sable, MD or one of the following Advanced Practice Providers on your designated Care Team:   Murray Hodgkins, NP Christell Faith, PA-C Cadence Kathlen Mody, PA-C Gerrie Nordmann, NP

## 2023-03-19 ENCOUNTER — Encounter: Payer: Self-pay | Admitting: Cardiology

## 2023-03-29 ENCOUNTER — Telehealth (HOSPITAL_COMMUNITY): Payer: Self-pay | Admitting: Emergency Medicine

## 2023-03-29 NOTE — Telephone Encounter (Signed)
Reaching out to patient to offer assistance regarding upcoming cardiac imaging study; pt verbalizes understanding of appt date/time, parking situation and where to check in, pre-test NPO status and medications ordered, and verified current allergies; name and call back number provided for further questions should they arise Haydon Kalmar RN Navigator Cardiac Imaging [facility] [phone] office [phone] cell  100mg metoprolol  

## 2023-04-01 ENCOUNTER — Ambulatory Visit
Admission: RE | Admit: 2023-04-01 | Discharge: 2023-04-01 | Disposition: A | Discharge status: Home / Self Care | Payer: Medicare Other | Source: Ambulatory Visit | Attending: Cardiology | Admitting: Cardiology

## 2023-04-01 DIAGNOSIS — I2510 Atherosclerotic heart disease of native coronary artery without angina pectoris: Secondary | ICD-10-CM | POA: Insufficient documentation

## 2023-04-01 MED ORDER — IOHEXOL 350 MG/ML SOLN
100.0000 mL | Freq: Once | INTRAVENOUS | Status: AC | PRN
  Administered 2023-04-01: 100 mL via INTRAVENOUS

## 2023-04-01 MED ORDER — NITROGLYCERIN 0.4 MG SL SUBL
0.8000 mg | SUBLINGUAL_TABLET | Freq: Once | SUBLINGUAL | Status: AC
  Administered 2023-04-01: 0.8 mg via SUBLINGUAL

## 2023-04-01 NOTE — Progress Notes (Signed)
Pt completed scan with no issues. Pt's ABCs intact. Pt denies any issues. Pt provided with water and encouraged to drink plenty throughout the day. Pt ambulatory with steady gait.

## 2023-04-18 ENCOUNTER — Ambulatory Visit: Payer: Medicare Other | Attending: Cardiology

## 2023-04-18 DIAGNOSIS — I2510 Atherosclerotic heart disease of native coronary artery without angina pectoris: Secondary | ICD-10-CM

## 2023-04-18 LAB — ECHOCARDIOGRAM COMPLETE
AR max vel: 3.29 cm2
AV Area VTI: 3.31 cm2
AV Area mean vel: 3.19 cm2
AV Mean grad: 2 mmHg
AV Peak grad: 4.1 mmHg
Ao pk vel: 1.01 m/s
Area-P 1/2: 1.87 cm2
Calc EF: 55.1 %
P 1/2 time: 434 msec
S' Lateral: 3.2 cm
Single Plane A2C EF: 55.1 %
Single Plane A4C EF: 56.5 %

## 2023-04-19 ENCOUNTER — Ambulatory Visit: Payer: Medicare Other | Admitting: Cardiology

## 2023-04-19 ENCOUNTER — Encounter: Payer: Self-pay | Admitting: Cardiology

## 2023-04-19 VITALS — BP 118/86 | HR 52 | Ht 66.0 in | Wt 182.6 lb

## 2023-04-19 DIAGNOSIS — I2510 Atherosclerotic heart disease of native coronary artery without angina pectoris: Secondary | ICD-10-CM | POA: Diagnosis not present

## 2023-04-19 DIAGNOSIS — E7800 Pure hypercholesterolemia, unspecified: Secondary | ICD-10-CM | POA: Diagnosis not present

## 2023-04-19 NOTE — Progress Notes (Signed)
Cardiology Office Note:    Date:  04/19/2023   ID:  Claire Thomas, DOB [birth year], MRN [id number]  PCP:  Dana Allan, MD   [facility] Providers Cardiologist:  Debbe Odea, MD     Referring MD: Dana Allan, MD   Chief Complaint  Patient presents with   Follow-up    Discuss test results.  Patient denies new or acute cardiac problems/concerns today.      History of Present Illness:    Claire Thomas is a 72 y.o. female with a hx of nonobstructive CAD (mild prox LAD stenosis), hyperlipidemia, hiatal hernia, presenting for follow-up.  Previously seen with epigastric pain.    Coronary CT was obtained to evaluate any worsening of CAD, echo also obtained.  She has a history of hiatal hernia, takes Protonix for possible GERD.  Overall she feels well, still has some epigastric pain which has not gotten worse.  No new concerns at this time.  Prior notes  echocardiogram 05/2021 showed preserved ejection fraction EF 55 to 60%, impaired relaxation.   Coronary CT 10/2021 moderate proximal LAD stenosis.  Calcium score 328.  Past Medical History:  Diagnosis Date   Arthritis    Asthma    worse with exertion    At risk for caregiver role strain 02/09/2021   Breast cancer screening by mammogram 12/17/2022   Chest tightness 06/18/2014   Chicken pox    Colon polyps    Dysphagia 02/25/2018   Edema, peripheral 03/04/2014   Essential tremor 08/10/2020   Fatigue 05/25/2021   Frequent headaches    GERD (gastroesophageal reflux disease)    GERD without esophagitis 03/21/2009   Qualifier: Diagnosis of   By: Ermalene Searing MD, Amy       IBS (irritable bowel syndrome)    diarrhea type   IBS (irritable bowel syndrome) 03/21/2009   Qualifier: Diagnosis of   By: Ermalene Searing MD, Amy       Left hip pain 10/22/2018   Nonobstructive CAD (coronary artery disease)    a. 06/2014 St echo: ex 8:13 (10.1 mets), moderate apical ant/mid anteroseptal HK; b. 06/2014 Cath: mild luminal irregularities, otw nl cors.  EF 60%.   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    Past Surgical History:  Procedure Laterality Date   ABDOMINAL HYSTERECTOMY     1976 DUB, pelvic pain ovaries intact    CARDIAC CATHETERIZATION  07/19/2014   [facility]   CARPAL TUNNEL RELEASE Bilateral    COLONOSCOPY     COLONOSCOPY N/A 01/01/2022   Procedure: COLONOSCOPY;  Surgeon: Jaynie Collins, DO;  Location: [facility] ENDOSCOPY;  Service: Gastroenterology;  Laterality: N/A;   ESOPHAGOGASTRODUODENOSCOPY     ESOPHAGOGASTRODUODENOSCOPY N/A 01/01/2022   Procedure: ESOPHAGOGASTRODUODENOSCOPY (EGD);  Surgeon: Jaynie Collins, DO;  Location: [facility] ENDOSCOPY;  Service: Gastroenterology;  Laterality: N/A;   KNEE ARTHROSCOPY Left 12/25/2004   MENISCUS REPAIR Left    PARTIAL HYSTERECTOMY     UPPER GASTROINTESTINAL ENDOSCOPY  Current Medications: Current Meds  Medication Sig   aspirin EC 81 MG tablet Take 1 tablet (81 mg total) by mouth daily. Swallow whole.   atorvastatin (LIPITOR) 40 MG tablet Take 1 tablet (40 mg total) by mouth daily.   Calcium Carb-Cholecalciferol (CALCIUM/VITAMIN D) 600-400 MG-UNIT TABS Take 2 tablets by mouth daily.   pantoprazole (PROTONIX) 40 MG tablet Take 1 tablet (40 mg total) by mouth 2 (two) times daily.   umeclidinium-vilanterol (ANORO ELLIPTA) 62.5-25 MCG/ACT AEPB Inhale 1 puff into the lungs daily.   Vitamin D, Ergocalciferol, (DRISDOL) 1.25 MG (50000 UNIT) CAPS capsule Take 1 capsule (50,000 Units total) by mouth every 7 (seven) days.     Allergies:    Patient has no known allergies.   Social History   Socioeconomic History   Marital status: Married    Spouse name: Not on file   Number of children: 2   Years of education: Not on file   Highest education level: Not on file  Occupational History   Occupation: Print production planner  Tobacco Use   Smoking status: Never    Passive exposure: Past   Smokeless tobacco: Never   Tobacco comments:    Both parents were smokers   Vaping Use   Vaping Use: Never used  Substance and Sexual Activity   Alcohol use: Yes    Alcohol/week: 1.0 standard drink of alcohol    Types: 1 Glasses of wine per week    Comment: occassioinal-wine every 6 months   Drug use: No   Sexual activity: Yes    Comment: men  Other Topics Concern   Not on file  Social History Narrative   Married    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    2 sons    Owns guns, wears seat belt, safe in relationship    Caregiver 18 y.o dad who does not live at home   Brother living    Right handed   Caffeine   Social Determinants of Health   Financial Resource Strain: Low Risk  (03/04/2023)   Overall Financial Resource Strain (CARDIA)    Difficulty of Paying Living Expenses: Not hard at all  Food Insecurity: No Food Insecurity (03/04/2023)   Hunger Vital Sign    Worried About Running Out of Food in the Last Year: Never true    Ran Out of Food in the Last Year: Never true  Transportation Needs: No Transportation Needs (03/04/2023)   PRAPARE - Administrator, Civil Service (Medical): No    Lack of Transportation (Non-Medical): No  Physical Activity: Inactive (03/04/2023)   Exercise Vital Sign    Days of Exercise per Week: 0 days    Minutes of Exercise per Session: 0 min  Stress: No Stress Concern Present (03/04/2023)   [employer] of Occupational Health - Occupational Stress Questionnaire    Feeling of Stress : Not at all  Social Connections: Unknown (03/07/2023)   Social Connection and Isolation Panel [NHANES]     Frequency of Communication with Friends and Family: More than three times a week    Frequency of Social Gatherings with Friends and Family: Twice a week    Attends Religious Services: Patient declined    Database administrator or Organizations: No    Attends Engineer, structural: Never    Marital Status: Married     Family History: The patient's family history includes AAA (abdominal aortic aneurysm) in her father; Aneurysm in her father; Arthritis in her mother and son;  Asthma in her paternal grandfather; Breast cancer (age of onset: 42) in her mother; Cancer in her father and mother; Colon cancer in her mother; Colon polyps in her mother; Dementia in her father; Diabetes in her paternal grandmother; Heart disease in her maternal grandfather; Hyperlipidemia in her brother, father, and mother; Hypertension in her brother and father; Prostate cancer in her father; Pulmonary fibrosis in her mother. There is no history of Esophageal cancer, Stomach cancer, or Rectal cancer.  ROS:   Please see the history of present illness.     All other systems reviewed and are negative.  EKGs/Labs/Other Studies Reviewed:    The following studies were reviewed today:   EKG:  EKG is ordered today.  EKG shows normal sinus rhythm, normal ECG.  Recent Labs: 02/25/2023: ALT 13; BUN 18; Creatinine, Ser 0.91; Hemoglobin 13.2; Platelets 305.0; Potassium 4.1; Sodium 141; TSH 2.09  Recent Lipid Panel    Component Value Date/Time   CHOL 172 02/26/2023 0745   TRIG 138.0 02/26/2023 0745   HDL 56.60 02/26/2023 0745   CHOLHDL 3 02/26/2023 0745   VLDL 27.6 02/26/2023 0745   LDLCALC 88 02/26/2023 0745   LDLDIRECT 143.0 03/06/2011 0946     Risk Assessment/Calculations:          Physical Exam:    VS:  BP 118/86 (BP Location: Left Arm, Patient Position: Sitting, Cuff Size: Large)   Pulse (!) 52   Ht 5\' 6"  (1.676 m)   Wt 182 lb 9.6 oz (82.8 kg)   SpO2 99%   BMI 29.47 kg/m     Wt Readings from Last  3 Encounters:  04/19/23 182 lb 9.6 oz (82.8 kg)  03/11/23 181 lb (82.1 kg)  03/07/23 180 lb 3.2 oz (81.7 kg)     GEN:  Well nourished, well developed in no acute distress HEENT: Normal NECK: No JVD; No carotid bruits CARDIAC: RRR, no murmurs, rubs, gallops RESPIRATORY:  Clear to auscultation without rales, wheezing or rhonchi  ABDOMEN: Soft, non-tender, non-distended MUSCULOSKELETAL:  No edema; No deformity  SKIN: Warm and dry NEUROLOGIC:  Alert and oriented x 3 PSYCHIATRIC:  Normal affect   ASSESSMENT:    1. Coronary artery disease involving native coronary artery of native heart, unspecified whether angina present   2. Pure hypercholesterolemia    PLAN:    In order of problems listed above:  Nonobstructive CAD, mild proximal LAD stenosis (25%) on coronary CT 03/2023.  EF 55 to 60%.  Continue aspirin 81, Lipitor 40.  Epigastric pain appears gastrointestinal in origin.  Advised to follow-up with PCP and or GI regarding hiatal hernia/epigastric pain. Hyperlipidemia, cholesterol controlled, continue Lipitor 40 mg daily.   Follow-up in 1 year.   Medication Adjustments/Labs and Tests Ordered: Current medicines are reviewed at length with the patient today.  Concerns regarding medicines are outlined above.  No orders of the defined types were placed in this encounter.    No orders of the defined types were placed in this encounter.     Patient Instructions  Medication Instructions:  Your physician recommends that you continue on your current medications as directed. Please refer to the Current Medication list given to you today.  *If you need a refill on your cardiac medications before your next appointment, please call your pharmacy*   Lab Work: -None ordered If you have labs (blood work) drawn today and your tests are completely normal, you will receive your results only by: MyChart Message (if you have MyChart) OR A paper copy  in the mail If you have any lab test  that is abnormal or we need to change your treatment, we will call you to review the results.   Testing/Procedures: -None ordered   Follow-Up: At [facility] you and your health needs are our priority.  As part of our continuing mission to provide you with exceptional heart care, we have created designated Provider Care Teams.  These Care Teams include your primary Cardiologist (physician) and Advanced Practice Providers (APPs -  Physician Assistants and Nurse Practitioners) who all work together to provide you with the care you need, when you need it.  We recommend signing up for the patient portal called "MyChart".  Sign up information is provided on this After Visit Summary.  MyChart is used to connect with patients for Virtual Visits (Telemedicine).  Patients are able to view lab/test results, encounter notes, upcoming appointments, etc.  Non-urgent messages can be sent to your provider as well.   To learn more about what you can do with MyChart, go to [email or web address].    Your next appointment:   1 year(s)  Provider:   You may see Debbe Odea, MD or one of the following Advanced Practice Providers on your designated Care Team:   Nicolasa Ducking, NP Eula Listen, PA-C Cadence Fransico Michael, PA-C Charlsie Quest, NP    Other Instructions -None    Signed, Debbe Odea, MD  04/19/2023 9:50 AM    [facility]

## 2023-04-19 NOTE — Patient Instructions (Signed)
Medication Instructions:  Your physician recommends that you continue on your current medications as directed. Please refer to the Current Medication list given to you today.  *If you need a refill on your cardiac medications before your next appointment, please call your pharmacy*   Lab Work: -None ordered If you have labs (blood work) drawn today and your tests are completely normal, you will receive your results only by: MyChart Message (if you have MyChart) OR A paper copy in the mail If you have any lab test that is abnormal or we need to change your treatment, we will call you to review the results.   Testing/Procedures: -None ordered   Follow-Up: At [facility], you and your health needs are our priority.  As part of our continuing mission to provide you with exceptional heart care, we have created designated Provider Care Teams.  These Care Teams include your primary Cardiologist (physician) and Advanced Practice Providers (APPs -  Physician Assistants and Nurse Practitioners) who all work together to provide you with the care you need, when you need it.  We recommend signing up for the patient portal called "MyChart".  Sign up information is provided on this After Visit Summary.  MyChart is used to connect with patients for Virtual Visits (Telemedicine).  Patients are able to view lab/test results, encounter notes, upcoming appointments, etc.  Non-urgent messages can be sent to your provider as well.   To learn more about what you can do with MyChart, go to [email or web address].    Your next appointment:   1 year(s)  Provider:   You may see Brian Agbor-Etang, MD or one of the following Advanced Practice Providers on your designated Care Team:   Christopher Berge, NP Ryan Dunn, PA-C Cadence Furth, PA-C Sheri Hammock, NP  Other Instructions -None  

## 2023-04-25 DIAGNOSIS — H2512 Age-related nuclear cataract, left eye: Secondary | ICD-10-CM | POA: Diagnosis not present

## 2023-05-28 IMAGING — MG MM DIGITAL SCREENING BILAT W/ TOMO AND CAD
8 series · 8 of 24 positions shown · non-contrast
Comparison: Previous exam(s).

CLINICAL DATA: Screening.

EXAM:
DIGITAL SCREENING BILATERAL MAMMOGRAM WITH TOMOSYNTHESIS AND CAD
TECHNIQUE: Bilateral screening digital craniocaudal and mediolateral oblique
mammograms were obtained. Bilateral screening digital breast
tomosynthesis was performed. The images were evaluated with
computer-aided detection.

[R MLO synth-2D]
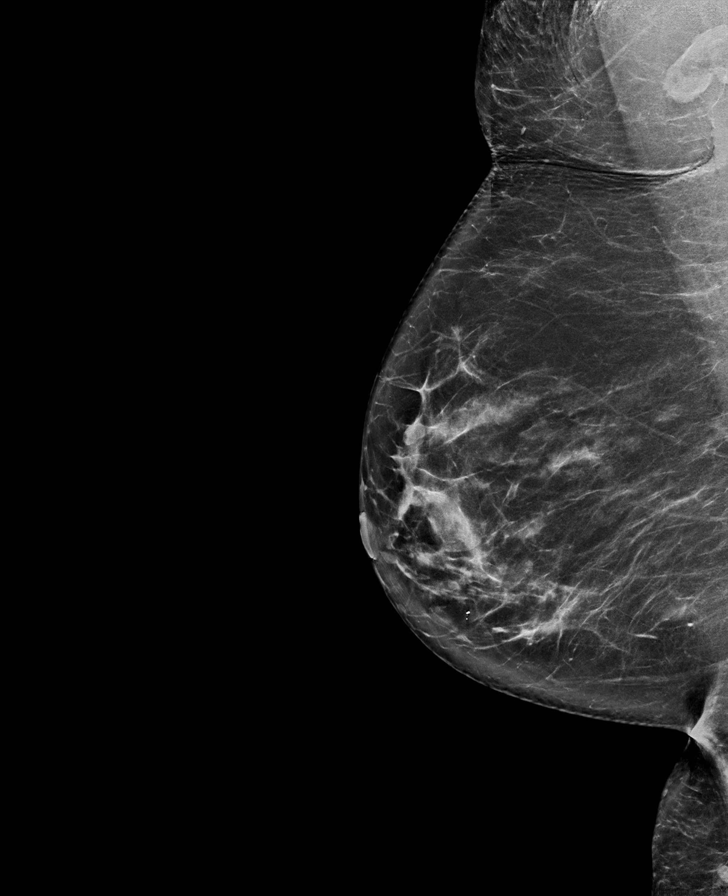

[L MLO synth-2D]
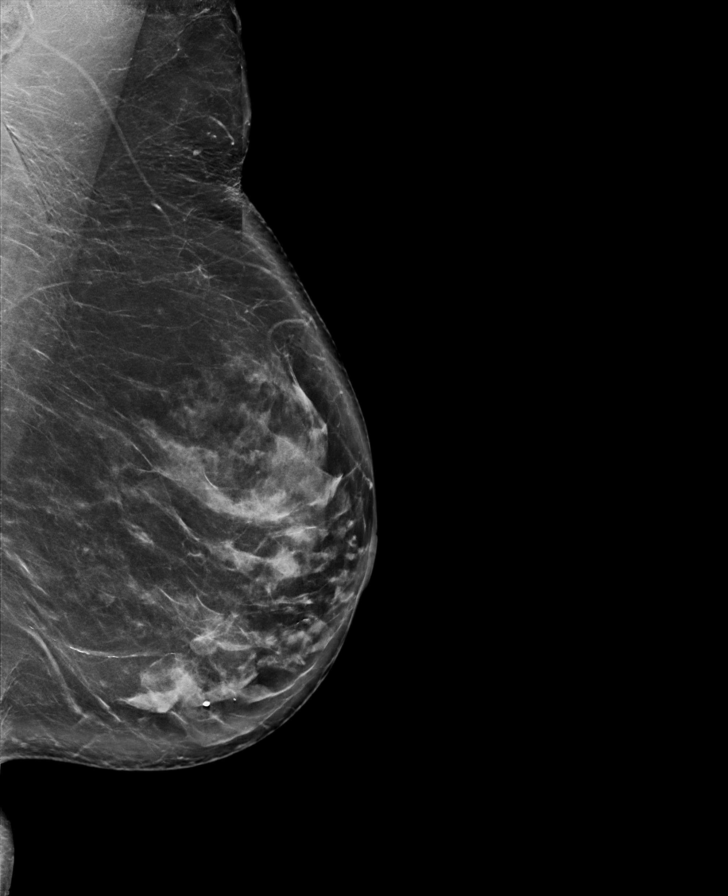

[L CC synth-2D]
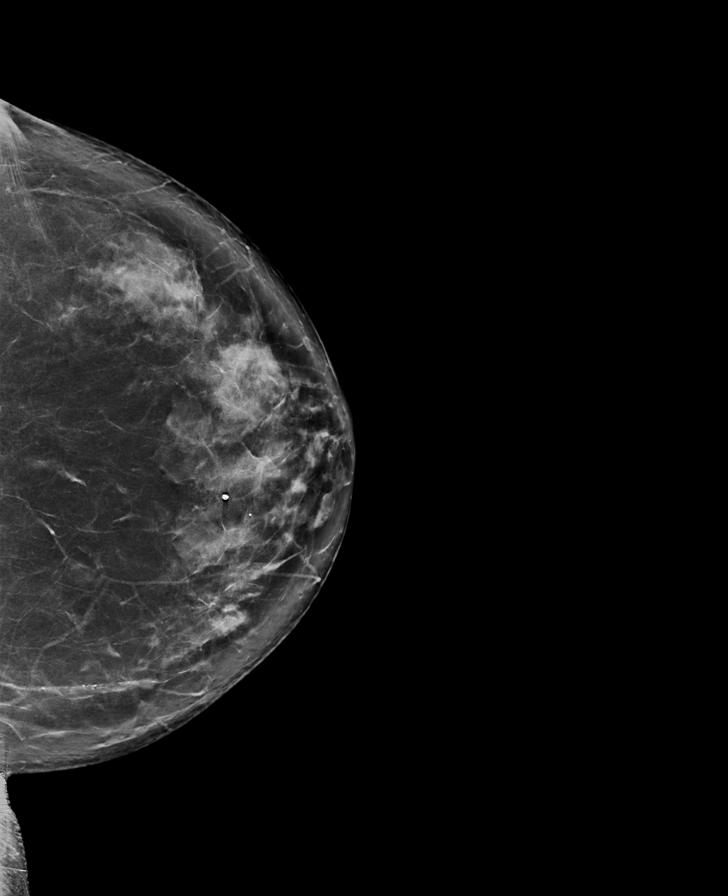

[R CC synth-2D]
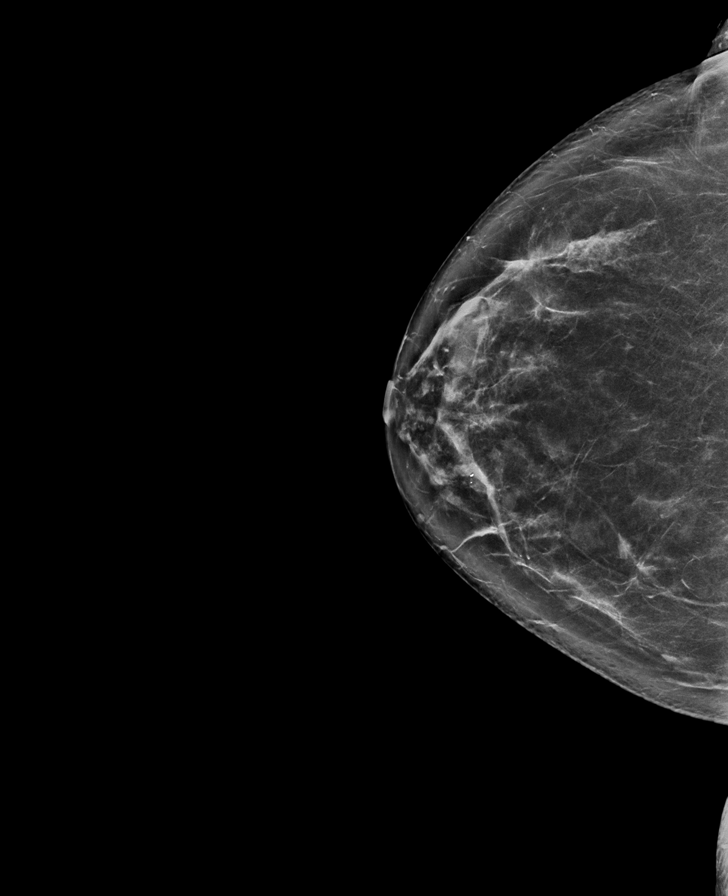

[L MLO tomo · tomo slice 50/99.0]
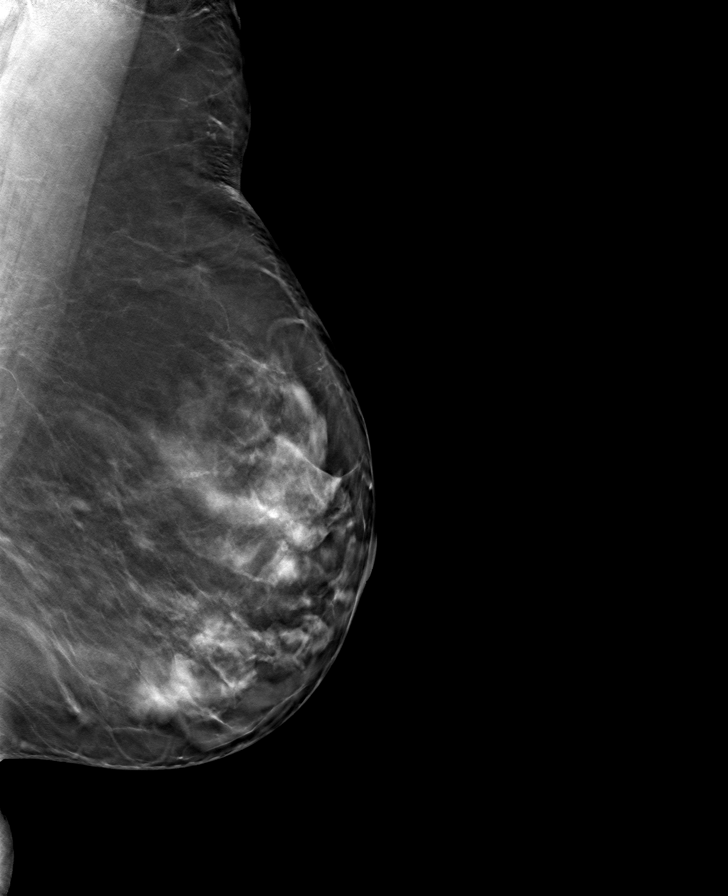

[R MLO tomo · tomo slice 41/81.0]
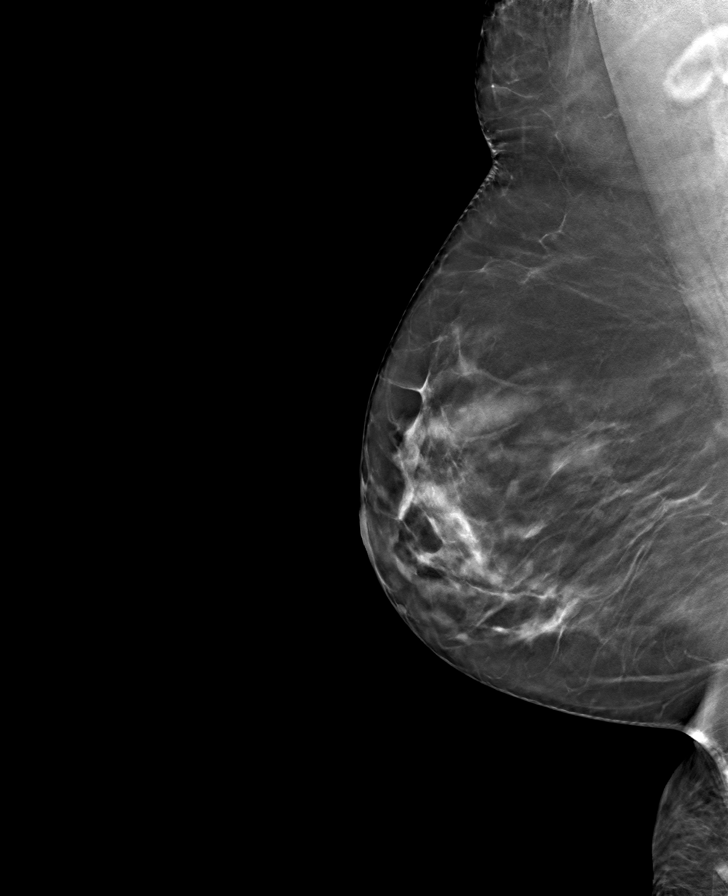

[L CC tomo · tomo slice 48/95.0]
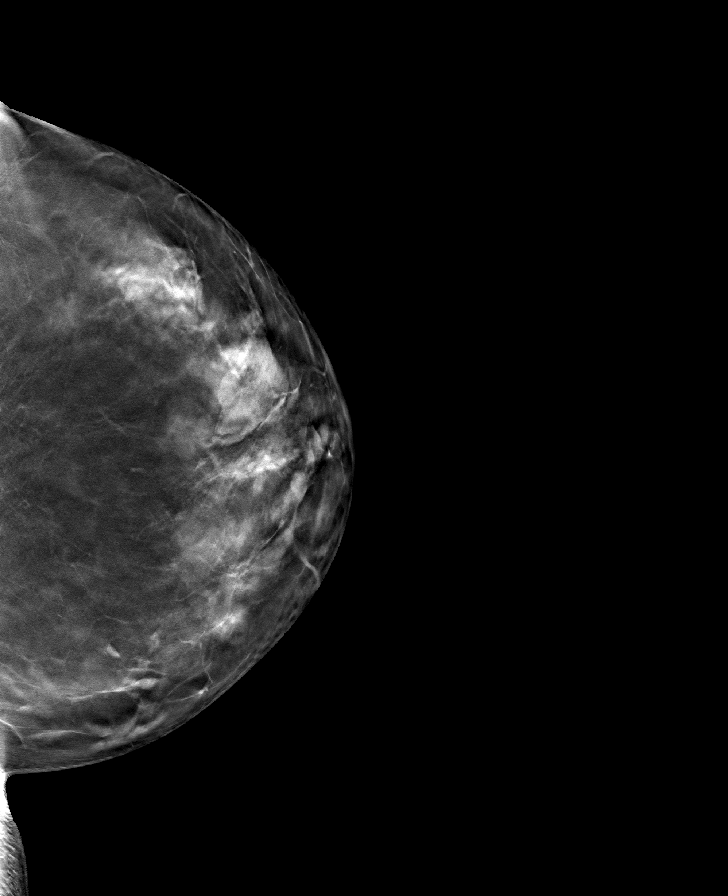

[R CC tomo · tomo slice 41/81.0]
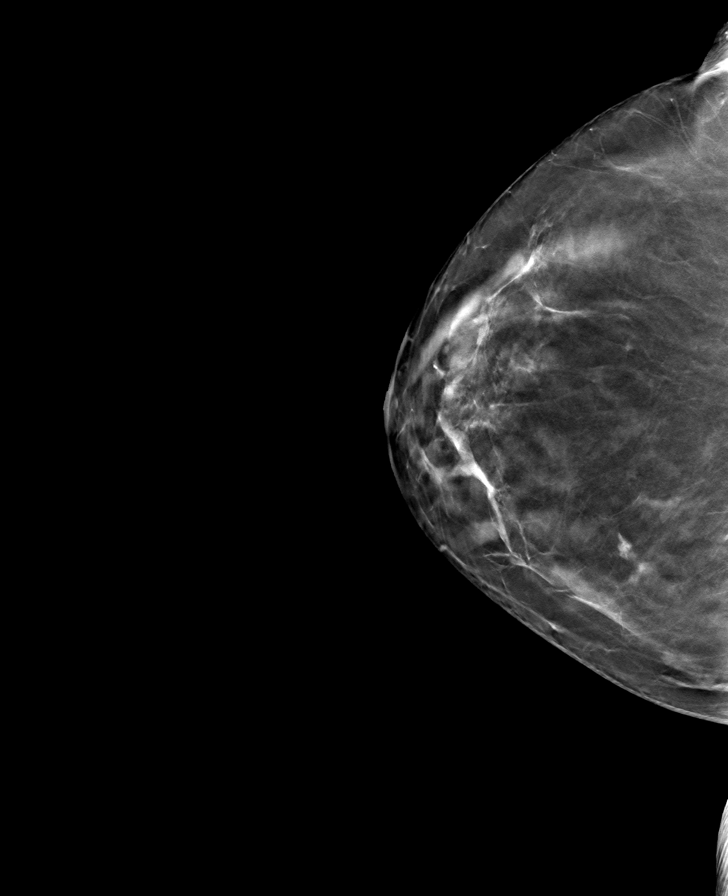

[8 of 24 positions shown; findings below may reference images not displayed]

ACR Breast Density Category c: The breast tissue is heterogeneously
dense, which may obscure small masses.
FINDINGS: There are no findings suspicious for malignancy.
IMPRESSION: No mammographic evidence of malignancy. A result letter of this
screening mammogram will be mailed directly to the patient.

RECOMMENDATION:
Screening mammogram in one year. (Code:Q3-W-BC3)

BI-RADS CATEGORY  1: Negative.

## 2023-06-12 DIAGNOSIS — H2512 Age-related nuclear cataract, left eye: Secondary | ICD-10-CM | POA: Diagnosis not present

## 2023-06-20 ENCOUNTER — Encounter: Payer: Self-pay | Admitting: Family Medicine

## 2023-08-08 ENCOUNTER — Encounter (INDEPENDENT_AMBULATORY_CARE_PROVIDER_SITE_OTHER): Payer: Self-pay

## 2023-09-04 ENCOUNTER — Ambulatory Visit: Payer: Medicare Other | Admitting: Family Medicine

## 2023-10-09 ENCOUNTER — Ambulatory Visit: Payer: Medicare Other | Admitting: Family Medicine

## 2023-10-23 ENCOUNTER — Ambulatory Visit: Payer: Medicare Other | Admitting: Family Medicine

## 2023-12-05 ENCOUNTER — Ambulatory Visit: Payer: Medicare Other | Admitting: Family Medicine

## 2024-01-09 ENCOUNTER — Ambulatory Visit: Payer: Medicare Other | Admitting: Family Medicine

## 2024-01-20 ENCOUNTER — Telehealth: Payer: Self-pay

## 2024-01-20 NOTE — Telephone Encounter (Signed)
Prescription Request  01/20/2024  LOV: Visit date not found  What is the name of the medication or equipment? Albuterol SUL 1.25 MG/3ML SOL  Have you contacted your pharmacy to request a refill? No   Which pharmacy would you like this sent to?  [facility] [address], [place] - [address] [place] [address] Phone: [phone] Fax: [phone]    Patient notified that their request is being sent to the clinical staff for review and that they should receive a response within 2 business days.   Please advise at Mobile [phone] (mobile)  Patient states her pulmonary doctor prescribed this medication to be used in her nebulizer.  Patient states she uses this as needed when her asthma acts up.  Patient states she does need to have it refilled.  Patient states she would like to know if we can refill this prescription for her.

## 2024-01-21 ENCOUNTER — Other Ambulatory Visit: Payer: Self-pay | Admitting: Family

## 2024-01-22 ENCOUNTER — Ambulatory Visit: Payer: Medicare Other | Admitting: Family Medicine

## 2024-01-22 NOTE — Telephone Encounter (Signed)
Pt has been notified and will reach out to pulmonology

## 2024-01-30 ENCOUNTER — Encounter: Payer: Self-pay | Admitting: Family Medicine

## 2024-01-30 ENCOUNTER — Ambulatory Visit: Payer: Medicare Other | Admitting: Family Medicine

## 2024-01-30 VITALS — BP 160/86 | HR 62 | Temp 98.2°F | Resp 18 | Ht 66.0 in | Wt 183.1 lb

## 2024-01-30 DIAGNOSIS — E785 Hyperlipidemia, unspecified: Secondary | ICD-10-CM | POA: Diagnosis not present

## 2024-01-30 DIAGNOSIS — J4520 Mild intermittent asthma, uncomplicated: Secondary | ICD-10-CM

## 2024-01-30 DIAGNOSIS — I10 Essential (primary) hypertension: Secondary | ICD-10-CM | POA: Diagnosis not present

## 2024-01-30 DIAGNOSIS — F39 Unspecified mood [affective] disorder: Secondary | ICD-10-CM

## 2024-01-30 DIAGNOSIS — E559 Vitamin D deficiency, unspecified: Secondary | ICD-10-CM

## 2024-01-30 DIAGNOSIS — K580 Irritable bowel syndrome with diarrhea: Secondary | ICD-10-CM

## 2024-01-30 DIAGNOSIS — M47812 Spondylosis without myelopathy or radiculopathy, cervical region: Secondary | ICD-10-CM

## 2024-01-30 LAB — LIPID PANEL
Cholesterol: 189 mg/dL (ref 0–200)
HDL: 59.7 mg/dL (ref >39.00)
LDL Cholesterol: 102 mg/dL — ABNORMAL HIGH (ref 0–99)
NonHDL: 129.02
Total CHOL/HDL Ratio: 3
Triglycerides: 134 mg/dL (ref 0.0–149.0)
VLDL: 26.8 mg/dL (ref 0.0–40.0)

## 2024-01-30 LAB — COMPREHENSIVE METABOLIC PANEL
ALT: 13 U/L (ref 0–35)
AST: 19 U/L (ref 0–37)
Albumin: 4.6 g/dL (ref 3.5–5.2)
Alkaline Phosphatase: 78 U/L (ref 39–117)
BUN: 21 mg/dL (ref 6–23)
CO2: 27 meq/L (ref 19–32)
Calcium: 9.6 mg/dL (ref 8.4–10.5)
Chloride: 102 meq/L (ref 96–112)
Creatinine, Ser: 0.88 mg/dL (ref 0.40–1.20)
GFR: 65.63 mL/min (ref >60.00)
Glucose, Bld: 92 mg/dL (ref 70–99)
Potassium: 4.6 meq/L (ref 3.5–5.1)
Sodium: 138 meq/L (ref 135–145)
Total Bilirubin: 0.5 mg/dL (ref 0.2–1.2)
Total Protein: 7.3 g/dL (ref 6.0–8.3)

## 2024-01-30 LAB — VITAMIN D 25 HYDROXY (VIT D DEFICIENCY, FRACTURES): VITD: 25.92 ng/mL — ABNORMAL LOW (ref 30.00–100.00)

## 2024-01-30 MED ORDER — AMLODIPINE BESYLATE 2.5 MG PO TABS: 2.5000 mg | ORAL_TABLET | Freq: Every evening | ORAL | 0 refills | Status: DC

## 2024-01-30 MED ORDER — CITALOPRAM HYDROBROMIDE 10 MG PO TABS: 10.0000 mg | ORAL_TABLET | Freq: Every day | ORAL | 0 refills | Status: DC

## 2024-01-30 MED ORDER — ALBUTEROL SULFATE 1.25 MG/3ML IN NEBU: 1.0000 | INHALATION_SOLUTION | Freq: Four times a day (QID) | RESPIRATORY_TRACT | 1 refills | Status: DC | PRN

## 2024-01-30 MED ORDER — VITAMIN D (ERGOCALCIFEROL) 1.25 MG (50000 UNIT) PO CAPS: 50000.0000 [IU] | ORAL_CAPSULE | ORAL | 2 refills | Status: AC

## 2024-01-30 NOTE — Assessment & Plan Note (Signed)
 Reports headaches and neck pain. -Recommend regular use of extra-strength Tylenol or Tylenol Arthritis. -Consider physical therapy, heat/ice application, and massage.

## 2024-01-30 NOTE — Assessment & Plan Note (Signed)
 Reports flare-ups with abdominal pain and diarrhea. -Recommend follow-up with GI specialist.

## 2024-01-30 NOTE — Assessment & Plan Note (Signed)
 Infrequent use of albuterol  and budesonide. -Consider switching to Airsupra , a combination inhaler, pending insurance coverage. -Refill albuterol  nebulizer prescription.

## 2024-01-30 NOTE — Assessment & Plan Note (Signed)
 Currently on Lipitor. -Check cholesterol levels today.

## 2024-01-30 NOTE — Assessment & Plan Note (Signed)
 Last checked in March of last year. -Check Vitamin D  levels today. -If low, prescribe high-dose Vitamin D . If normal, recommend daily over-the-counter Vitamin D  supplementation.

## 2024-01-30 NOTE — Assessment & Plan Note (Addendum)
 Elevated blood pressure readings at home and in clinic. Previously controlled without medication, but now considering initiation of antihypertensive therapy. -Check kidney function and repeat blood pressure measurement today. -Start Amlodipine  2.5 mg at night

## 2024-01-30 NOTE — Progress Notes (Signed)
 SUBJECTIVE:   Chief Complaint  Patient presents with   Hypertension    6 month follow up   HPI Presents for follow up chronic disease management   Discussed the use of AI scribe software for clinical note transcription with the patient, who gave verbal consent to proceed.  History of Present Illness Claire Thomas is a 73 year old female with hypertension who presents for follow-up of blood pressure management.  Her home blood pressure readings are sometimes elevated, with a recent reading of 158/71 mmHg, and she notes that her blood pressure tends to be around 150 mmHg. She is not currently on any antihypertensive medication.  She is taking Lipitor and has been fasting for blood work, including cholesterol and kidney function tests. Her vitamin D  levels were last checked in March of the previous year.  She experiences headaches that begin as an earache and extend to her neck, which she attributes to arthritis diagnosed by an ENT specialist. She has a curved back and experiences hip and back problems. She manages the pain with Tylenol or ibuprofen, though she limits ibuprofen due to gastrointestinal issues. The headaches can disrupt her sleep, and she has attempted to adjust her posture to alleviate symptoms.  She has a history of irritable bowel syndrome with diarrhea (IBS-D) and experiences flare-ups characterized by abdominal tightness and back pain, accompanied by uncontrollable diarrhea. These episodes can occur multiple times a day for about a week. She has been informed of a small hernia, but it has not been associated with her symptoms. She manages her symptoms by avoiding greasy foods and eating smaller, more frequent meals.  Regarding her respiratory health, she uses albuterol  and budesonide  sparingly, experiencing exacerbations about twice a year. She recently ran out of albuterol  but still has budesonide . She does not use these medications unless necessary.  Her family  history includes recent stressors, such as the loss of her daughter-in-law to cancer and her son moving to [place] , which may contribute to her IBS symptoms. She has been the primary support for her family during these times.  No chest pain, shortness of breath, abdominal pain, hematochezia, or neck pain. She reports occasional pedal edema.      PERTINENT PMH / PSH: As above  OBJECTIVE:  BP (!) 160/86   Pulse 62   Temp 98.2 F (36.8 C)   Resp 18   Ht 5' 6 (1.676 m)   Wt 183 lb 2 oz (83.1 kg)   SpO2 100%   BMI 29.56 kg/m    Physical Exam Vitals reviewed.  Constitutional:      General: She is not in acute distress.    Appearance: Normal appearance. She is normal weight. She is not ill-appearing, toxic-appearing or diaphoretic.  Eyes:     General:        Right eye: No discharge.        Left eye: No discharge.     Conjunctiva/sclera: Conjunctivae normal.  Cardiovascular:     Rate and Rhythm: Normal rate and regular rhythm.     Heart sounds: Normal heart sounds.  Pulmonary:     Effort: Pulmonary effort is normal.     Breath sounds: Normal breath sounds.  Abdominal:     General: Bowel sounds are normal.  Musculoskeletal:        General: Normal range of motion.  Skin:    General: Skin is warm and dry.  Neurological:     General: No focal deficit present.  Mental Status: She is alert and oriented to person, place, and time. Mental status is at baseline.  Psychiatric:        Mood and Affect: Mood normal.        Behavior: Behavior normal.        Thought Content: Thought content normal.        Judgment: Judgment normal.           01/30/2024    7:51 AM 03/07/2023    3:51 PM 03/04/2023    1:11 PM 11/28/2022    1:09 PM 03/14/2022    9:12 AM  Depression screen PHQ 2/9  Decreased Interest 0 0 0 0 2  Down, Depressed, Hopeless 0 0 0 0 2  PHQ - 2 Score 0 0 0 0 4  Altered sleeping 2    2  Tired, decreased energy 2    3  Change in appetite 0    3  Feeling bad or failure  about yourself  0    1  Trouble concentrating 0    1  Moving slowly or fidgety/restless 0    0  Suicidal thoughts 0    0  PHQ-9 Score 4    14  Difficult doing work/chores Not difficult at all    Somewhat difficult      01/30/2024    7:52 AM 03/04/2023    1:11 PM 03/14/2022    9:13 AM 08/10/2020    9:43 AM  GAD 7 : Generalized Anxiety Score  Nervous, Anxious, on Edge 0 1 1 0  Control/stop worrying 0 0 1 0  Worry too much - different things 0 0 1 0  Trouble relaxing 0 0 1 0  Restless 0 0 0 0  Easily annoyed or irritable 0 0 1 0  Afraid - awful might happen 0 0 0 0  Total GAD 7 Score 0 1 5 0  Anxiety Difficulty Not difficult at all Not difficult at all Somewhat difficult     ASSESSMENT/PLAN:  Essential hypertension Assessment & Plan: Elevated blood pressure readings at home and in clinic. Previously controlled without medication, but now considering initiation of antihypertensive therapy. -Check kidney function and repeat blood pressure measurement today. -Start Amlodipine  2.5 mg at night  Orders: -     Comprehensive metabolic panel -     amLODIPine  Besylate; Take 1 tablet (2.5 mg total) by mouth at bedtime.  Dispense: 30 tablet; Refill: 0  Hyperlipidemia, unspecified hyperlipidemia type Assessment & Plan: Currently on Lipitor. -Check cholesterol levels today.  Orders: -     Lipid panel  Vitamin D  deficiency Assessment & Plan: Last checked in March of last year. -Check Vitamin D  levels today. -If low, prescribe high-dose Vitamin D . If normal, recommend daily over-the-counter Vitamin D  supplementation.  Orders: -     VITAMIN D  25 Hydroxy (Vit-D Deficiency, Fractures) -     Vitamin D  (Ergocalciferol ); Take 1 capsule (50,000 Units total) by mouth every 7 (seven) days.  Dispense: 12 capsule; Refill: 2  Mild intermittent asthma, unspecified whether complicated Assessment & Plan: Infrequent use of albuterol  and budesonide . -Consider switching to Airsupra , a combination  inhaler, pending insurance coverage. -Refill albuterol  nebulizer prescription.  Orders: -     Albuterol  Sulfate; Take 3 mLs (1.25 mg total) by nebulization every 6 (six) hours as needed for wheezing.  Dispense: 75 mL; Refill: 1  Mood disorder (HCC) Assessment & Plan: Increase stress at home  -Start Celexa  10 mg daily -Follow up in 4 weeks  Orders: -     Citalopram  Hydrobromide; Take 1 tablet (10 mg total) by mouth daily.  Dispense: 30 tablet; Refill: 0  Cervical arthritis Assessment & Plan: Reports headaches and neck pain. -Recommend regular use of extra-strength Tylenol or Tylenol Arthritis. -Consider physical therapy, heat/ice application, and massage.   Irritable bowel syndrome with diarrhea Assessment & Plan: Reports flare-ups with abdominal pain and diarrhea. -Recommend follow-up with GI specialist.    PDMP reviewed  Return in about 4 weeks (around 02/27/2024) for PCP, HTN, Mood.  Glenys Ferrari, MD

## 2024-01-30 NOTE — Assessment & Plan Note (Signed)
 Increase stress at home  -Start Celexa  10 mg daily -Follow up in 4 weeks

## 2024-01-30 NOTE — Patient Instructions (Addendum)
 It was a pleasure meeting you today. Thank you for allowing me to take part in your health care.  Our goals for today as we discussed include:  We will get some labs today.  If they are abnormal or we need to do something about them, I will call you.  If they are normal, I will send you a message on MyChart (if it is active) or a letter in the mail.  If you don't hear from us  in 2 weeks, please call the office at the number below.   Start Celexa  10 mg daily. Notify MD of any side effects in 1 week by MyChart  Start Amlodipine  2.5 mg at night for blood pressure Monitor blood pressure at home.  Goal is less than 140/90  Follow up in 4 weeks   This is a list of the screening recommended for you and due dates:  Health Maintenance  Topic Date Due   DTaP/Tdap/Td vaccine (4 - Td or Tdap) 03/05/2021   COVID-19 Vaccine (1 - 2024-25 season) Never done   Medicare Annual Wellness Visit  03/06/2024   Flu Shot  03/23/2024*   Zoster (Shingles) Vaccine (1 of 2) 04/27/2024*   Pneumonia Vaccine (2 of 2 - PPSV23 or PCV20) 01/28/2025*   Mammogram  03/03/2025   Colon Cancer Screening  10/18/2027   DEXA scan (bone density measurement)  Completed   Hepatitis C Screening  Completed   HPV Vaccine  Aged Out  *Topic was postponed. The date shown is not the original due date.      If you have any questions or concerns, please do not hesitate to call the office at [phone].  I look forward to our next visit and until then take care and stay safe.  Regards,   Glenys Ferrari, MD   [facility]

## 2024-02-23 ENCOUNTER — Other Ambulatory Visit: Payer: Self-pay | Admitting: Family Medicine

## 2024-02-23 DIAGNOSIS — I10 Essential (primary) hypertension: Secondary | ICD-10-CM

## 2024-02-23 DIAGNOSIS — F39 Unspecified mood [affective] disorder: Secondary | ICD-10-CM

## 2024-02-28 ENCOUNTER — Encounter: Payer: Self-pay | Admitting: Family Medicine

## 2024-02-28 ENCOUNTER — Ambulatory Visit: Payer: Medicare Other | Admitting: Family Medicine

## 2024-02-28 VITALS — BP 136/82 | HR 59 | Temp 98.0°F | Resp 20 | Ht 66.0 in | Wt 188.2 lb

## 2024-02-28 DIAGNOSIS — K580 Irritable bowel syndrome with diarrhea: Secondary | ICD-10-CM | POA: Diagnosis not present

## 2024-02-28 DIAGNOSIS — J4520 Mild intermittent asthma, uncomplicated: Secondary | ICD-10-CM | POA: Diagnosis not present

## 2024-02-28 DIAGNOSIS — I10 Essential (primary) hypertension: Secondary | ICD-10-CM

## 2024-02-28 DIAGNOSIS — R600 Localized edema: Secondary | ICD-10-CM

## 2024-02-28 DIAGNOSIS — F39 Unspecified mood [affective] disorder: Secondary | ICD-10-CM

## 2024-02-28 MED ORDER — AMLODIPINE BESYLATE 2.5 MG PO TABS: 2.5000 mg | ORAL_TABLET | Freq: Every day | ORAL | 1 refills | Status: DC

## 2024-02-28 MED ORDER — AIRSUPRA 90-80 MCG/ACT IN AERO: 1.0000 | INHALATION_SPRAY | Freq: Four times a day (QID) | RESPIRATORY_TRACT | 11 refills | Status: DC | PRN

## 2024-02-28 MED ORDER — CITALOPRAM HYDROBROMIDE 20 MG PO TABS: 20.0000 mg | ORAL_TABLET | Freq: Every day | ORAL | 1 refills | Status: DC

## 2024-02-28 MED ORDER — AIRSUPRA 90-80 MCG/ACT IN AERO: 1.0000 | INHALATION_SPRAY | Freq: Four times a day (QID) | RESPIRATORY_TRACT | Status: DC | PRN

## 2024-02-28 NOTE — Patient Instructions (Addendum)
 It was a pleasure meeting you today. Thank you for allowing me to take part in your health care.  Our goals for today as we discussed include:  Blood pressure is good.  Goal <150/90 Continue Amlodipine 2.5 mg at night.  Refills sent  Increase Celexa to 20 mg daily  Start Airsupra 1-2 puffs every 4-6 hours as needed for shortness of breath or wheezing.  Max of 12 puffs in 24 hours.  Rinse mouth after each inhalation.  Stop Albuterol nebulizer.  Do not take this with the Airsupra. If Airsupra not approved by insurance will reorder the Albuterol.   This is a list of the screening recommended for you and due dates:  Health Maintenance  Topic Date Due   DTaP/Tdap/Td vaccine (4 - Td or Tdap) 03/05/2021   COVID-19 Vaccine (1 - 2024-25 season) Never done   Medicare Annual Wellness Visit  03/06/2024   Flu Shot  03/23/2024*   Zoster (Shingles) Vaccine (1 of 2) 04/27/2024*   Pneumonia Vaccine (2 of 2 - PPSV23 or PCV20) 01/28/2025*   Mammogram  03/03/2025   Colon Cancer Screening  10/18/2027   DEXA scan (bone density measurement)  Completed   Hepatitis C Screening  Completed   HPV Vaccine  Aged Out  *Topic was postponed. The date shown is not the original due date.      If you have any questions or concerns, please do not hesitate to call the office at [phone].  I look forward to our next visit and until then take care and stay safe.  Regards,   Dana Allan, MD   [facility]

## 2024-02-28 NOTE — Progress Notes (Signed)
 SUBJECTIVE:   Chief Complaint  Patient presents with   Hypertension    4 week follow up   HPI Presents for follow up chronic disease management  Discussed the use of AI scribe software for clinical note transcription with the patient, who gave verbal consent to proceed.  History of Present Illness The patient presents for a four-week follow-up for hypertension and evaluation of Celexa use.  She is currently taking amlodipine 2.5 mg for hypertension. Her blood pressure has shown some improvement, with a recent reading of 136/82 mmHg, although she has recorded higher readings at home, such as 160/99 mmHg, using an [employer] blood pressure machine. She questions the accuracy of her home device, noting variability in readings, and plans to compare her home readings with those taken by her grandson, who is an EMT, and at a nurse clinic. No chest pain or shortness of breath.  She is on a small dose of Celexa (10 mg) and has not noticed any significant changes in her symptoms. She is considering an increase to 20 mg to see if it makes a difference. Previously, she had used Xanax and noted a distinct feeling, which she does not experience with Celexa.  She has experienced some swelling in her feet, which was present before starting amlodipine but has not worsened. She does not use compression stockings and is monitoring her salt intake.  She has a history of IBS and has contacted her GI doctor due to ongoing stomach issues. She is an established patient but faces a three-month wait for an appointment. No improvement in her IBS symptoms since starting Celexa.    PERTINENT PMH / PSH: As above  OBJECTIVE:  BP 136/82   Pulse (!) 59   Temp 98 F (36.7 C)   Resp 20   Ht 5\' 6"  (1.676 m)   Wt 188 lb 4 oz (85.4 kg)   SpO2 97%   BMI 30.38 kg/m    Physical Exam Vitals reviewed.  Constitutional:      General: She is not in acute distress.    Appearance: Normal appearance. She is obese. She is  not ill-appearing, toxic-appearing or diaphoretic.  Eyes:     General:        Right eye: No discharge.        Left eye: No discharge.     Conjunctiva/sclera: Conjunctivae normal.  Cardiovascular:     Rate and Rhythm: Normal rate and regular rhythm.     Heart sounds: Normal heart sounds.  Pulmonary:     Effort: Pulmonary effort is normal.     Breath sounds: Normal breath sounds.  Abdominal:     General: Bowel sounds are normal.  Musculoskeletal:        General: Normal range of motion.  Skin:    General: Skin is warm and dry.  Neurological:     General: No focal deficit present.     Mental Status: She is alert and oriented to person, place, and time. Mental status is at baseline.  Psychiatric:        Mood and Affect: Mood normal.        Behavior: Behavior normal.        Thought Content: Thought content normal.        Judgment: Judgment normal.           02/28/2024    8:13 AM 01/30/2024    7:51 AM 03/07/2023    3:51 PM 03/04/2023    1:11 PM 11/28/2022  1:09 PM  Depression screen PHQ 2/9  Decreased Interest 0 0 0 0 0  Down, Depressed, Hopeless 0 0 0 0 0  PHQ - 2 Score 0 0 0 0 0  Altered sleeping 2 2     Tired, decreased energy 2 2     Change in appetite 0 0     Feeling bad or failure about yourself  0 0     Trouble concentrating 0 0     Moving slowly or fidgety/restless 0 0     Suicidal thoughts 0 0     PHQ-9 Score 4 4     Difficult doing work/chores Not difficult at all Not difficult at all         02/28/2024    8:13 AM 01/30/2024    7:52 AM 03/04/2023    1:11 PM 03/14/2022    9:13 AM  GAD 7 : Generalized Anxiety Score  Nervous, Anxious, on Edge 0 0 1 1  Control/stop worrying 0 0 0 1  Worry too much - different things 2 0 0 1  Trouble relaxing 0 0 0 1  Restless 0 0 0 0  Easily annoyed or irritable 0 0 0 1  Afraid - awful might happen 0 0 0 0  Total GAD 7 Score 2 0 1 5  Anxiety Difficulty Not difficult at all Not difficult at all Not difficult at all Somewhat  difficult    ASSESSMENT/PLAN:  Essential hypertension Assessment & Plan: Blood pressure slightly decreased, possibly due to reduced anxiety or low-dose amlodipine. Home readings inconsistent, possibly due to machine variability. Office reading 136/82 mmHg. Target <150/90 mmHg, ideally 130/80 mmHg. Discussed accurate measurement techniques and potential medication adjustment if readings remain high. - Refill amlodipine 2.5 mg for 90 days. - Schedule appointment with nurse clinic to compare home blood pressure machine readings with clinic readings. - Instruct on proper blood pressure measurement techniques. - Follow up sooner if blood pressure increases.  Orders: -     amLODIPine Besylate; Take 1 tablet (2.5 mg total) by mouth at bedtime.  Dispense: 90 tablet; Refill: 1  Mood disorder [facility]) Assessment & Plan: On Celexa 10 mg with no improvement. Discussed increasing dose to 20 mg, potential effects, and options if no improvement after dose increase. No significant change in PHQ9/GAD  - Increase Celexa to 20 mg. - Follow up in 3 months   Orders: -     Citalopram Hydrobromide; Take 1 tablet (20 mg total) by mouth daily.  Dispense: 90 tablet; Refill: 1  Mild intermittent asthma, unspecified whether complicated Assessment & Plan: Occasional nebulizer use, considering switch to [place] pending insurance approval. Airsupra contains albuterol and steroid, requires mouth rinsing. Discussed discontinuing albuterol if Airsupra approved. - Send prescription for Airsupra and check for insurance approval. - Discontinue albuterol if Paulene Floor is approved. - Instruct on Airsupra usage: 1-2 puffs every 4-6 hours, max 12 puffs in 24 hours. - Instruct to rinse mouth after using Airsupra.  -Sample Airsupra x 1 provided  Orders: Paulene Floor; Inhale 1-2 puffs into the lungs every 6 (six) hours as needed.  Dispense: 10.7 g; Refill: 11 -     Airsupra; Inhale 1-2 puffs into the lungs every 6 (six) hours  as needed.  Irritable bowel syndrome with diarrhea Assessment & Plan: Long-standing IBS.  - Consider further management if symptoms persist. -Has appointment with GI in May   Edema, peripheral Assessment & Plan: Chronic. Mild bilateral foot swelling.   Discussed  non-pharmacological management and potential diuretic use if swelling persists. - Recommend wearing compression stockings. - Advise on reducing salt intake. - Consider diuretic if swelling does not improve.    PDMP reviewed  Return if symptoms worsen or fail to improve, for PCP.  Dana Allan, MD

## 2024-02-28 NOTE — Assessment & Plan Note (Signed)
 Blood pressure slightly decreased, possibly due to reduced anxiety or low-dose amlodipine. Home readings inconsistent, possibly due to machine variability. Office reading 136/82 mmHg. Target <150/90 mmHg, ideally 130/80 mmHg. Discussed accurate measurement techniques and potential medication adjustment if readings remain high. - Refill amlodipine 2.5 mg for 90 days. - Schedule appointment with nurse clinic to compare home blood pressure machine readings with clinic readings. - Instruct on proper blood pressure measurement techniques. - Follow up sooner if blood pressure increases.

## 2024-02-28 NOTE — Assessment & Plan Note (Signed)
 On Celexa 10 mg with no improvement. Discussed increasing dose to 20 mg, potential effects, and options if no improvement after dose increase. No significant change in PHQ9/GAD  - Increase Celexa to 20 mg. - Follow up in 3 months

## 2024-02-28 NOTE — Assessment & Plan Note (Signed)
 Occasional nebulizer use, considering switch to [place] pending insurance approval. Claire Thomas contains albuterol and steroid, requires mouth rinsing. Discussed discontinuing albuterol if Airsupra approved. - Send prescription for Airsupra and check for insurance approval. - Discontinue albuterol if Claire Thomas is approved. - Instruct on Airsupra usage: 1-2 puffs every 4-6 hours, max 12 puffs in 24 hours. - Instruct to rinse mouth after using Airsupra.  -Sample Airsupra x 1 provided

## 2024-02-28 NOTE — Assessment & Plan Note (Signed)
 Chronic. Mild bilateral foot swelling.   Discussed non-pharmacological management and potential diuretic use if swelling persists. - Recommend wearing compression stockings. - Advise on reducing salt intake. - Consider diuretic if swelling does not improve.

## 2024-02-28 NOTE — Assessment & Plan Note (Signed)
 Long-standing IBS.  - Consider further management if symptoms persist. -Has appointment with GI in May

## 2024-03-05 ENCOUNTER — Encounter: Payer: Self-pay | Admitting: Family Medicine

## 2024-03-09 ENCOUNTER — Other Ambulatory Visit: Payer: Self-pay | Admitting: Family Medicine

## 2024-03-09 DIAGNOSIS — R1011 Right upper quadrant pain: Secondary | ICD-10-CM

## 2024-03-10 NOTE — Telephone Encounter (Signed)
 Pt has already been scheduled to go to [facility] for tomorrow 03/11/2024

## 2024-03-11 ENCOUNTER — Ambulatory Visit
Admission: RE | Admit: 2024-03-11 | Discharge: 2024-03-11 | Disposition: A | Discharge status: Home / Self Care | Source: Ambulatory Visit | Attending: Family Medicine | Admitting: Family Medicine

## 2024-03-11 ENCOUNTER — Other Ambulatory Visit: Payer: Self-pay | Admitting: Family Medicine

## 2024-03-11 DIAGNOSIS — R1011 Right upper quadrant pain: Secondary | ICD-10-CM | POA: Insufficient documentation

## 2024-03-11 DIAGNOSIS — R1084 Generalized abdominal pain: Secondary | ICD-10-CM

## 2024-03-12 ENCOUNTER — Telehealth: Payer: Self-pay | Admitting: Family Medicine

## 2024-03-12 NOTE — Telephone Encounter (Signed)
 Lft pt vm to call ofc to sch CT. thanks

## 2024-03-24 ENCOUNTER — Other Ambulatory Visit: Payer: Self-pay | Admitting: Family Medicine

## 2024-03-24 DIAGNOSIS — R1013 Epigastric pain: Secondary | ICD-10-CM

## 2024-03-24 DIAGNOSIS — R1084 Generalized abdominal pain: Secondary | ICD-10-CM

## 2024-03-30 ENCOUNTER — Ambulatory Visit
Admission: RE | Admit: 2024-03-30 | Discharge: 2024-03-30 | Disposition: A | Discharge status: Home / Self Care | Source: Ambulatory Visit | Attending: Family Medicine | Admitting: Family Medicine

## 2024-03-30 DIAGNOSIS — R1084 Generalized abdominal pain: Secondary | ICD-10-CM | POA: Insufficient documentation

## 2024-03-30 DIAGNOSIS — R1013 Epigastric pain: Secondary | ICD-10-CM | POA: Insufficient documentation

## 2024-03-30 MED ORDER — IOHEXOL 300 MG/ML  SOLN
100.0000 mL | Freq: Once | INTRAMUSCULAR | Status: AC | PRN
  Administered 2024-03-30: 100 mL via INTRAVENOUS

## 2024-04-08 ENCOUNTER — Encounter: Payer: Self-pay | Admitting: Family Medicine

## 2024-04-16 ENCOUNTER — Telehealth: Payer: Self-pay

## 2024-04-16 NOTE — Telephone Encounter (Signed)
 Pt seen mychart message

## 2024-04-16 NOTE — Telephone Encounter (Signed)
 Copied from CRM [id number]. Topic: Clinical - Lab/Test Results >> Apr 16, 2024  9:25 AM Crispin Dolphin wrote: Reason for CRM: Patient returned missed call for CT scan results. Called [facility] - clinician not available. Let patient know she should also be able to review provider note on Mychart. She said she will do that and call back if she has any questions or concerns. Thank You

## 2024-05-29 ENCOUNTER — Encounter: Payer: Self-pay | Admitting: Cardiology

## 2024-06-03 ENCOUNTER — Ambulatory Visit: Payer: Self-pay

## 2024-06-03 DIAGNOSIS — K222 Esophageal obstruction: Secondary | ICD-10-CM | POA: Diagnosis not present

## 2024-06-03 DIAGNOSIS — K589 Irritable bowel syndrome without diarrhea: Secondary | ICD-10-CM | POA: Diagnosis not present

## 2024-06-03 DIAGNOSIS — K642 Third degree hemorrhoids: Secondary | ICD-10-CM | POA: Diagnosis not present

## 2024-06-03 DIAGNOSIS — K2960 Other gastritis without bleeding: Secondary | ICD-10-CM | POA: Diagnosis not present

## 2024-06-03 DIAGNOSIS — K5730 Diverticulosis of large intestine without perforation or abscess without bleeding: Secondary | ICD-10-CM | POA: Diagnosis not present

## 2024-06-03 DIAGNOSIS — D122 Benign neoplasm of ascending colon: Secondary | ICD-10-CM | POA: Diagnosis present

## 2024-06-03 DIAGNOSIS — K209 Esophagitis, unspecified without bleeding: Secondary | ICD-10-CM | POA: Diagnosis not present

## 2024-06-03 DIAGNOSIS — K449 Diaphragmatic hernia without obstruction or gangrene: Secondary | ICD-10-CM | POA: Diagnosis not present

## 2024-07-29 ENCOUNTER — Telehealth: Payer: Self-pay | Admitting: Family Medicine

## 2024-07-29 NOTE — Telephone Encounter (Signed)
 Left message on voice mail and sent MyChart message to call and schedule a TOC with Leron Glance.

## 2024-08-31 ENCOUNTER — Encounter: Payer: Self-pay | Admitting: Nurse Practitioner

## 2024-09-24 ENCOUNTER — Encounter: Payer: Self-pay | Admitting: Nurse Practitioner

## 2024-09-24 DIAGNOSIS — Z1231 Encounter for screening mammogram for malignant neoplasm of breast: Secondary | ICD-10-CM

## 2024-09-24 NOTE — Telephone Encounter (Signed)
 Pt has a toc scheduled for 11/17/24.  Order pended

## 2024-11-10 NOTE — Telephone Encounter (Signed)
 open in error

## 2024-11-17 ENCOUNTER — Ambulatory Visit: Admitting: Nurse Practitioner

## 2024-11-17 VITALS — BP 138/80 | HR 59 | Temp 98.0°F | Ht 66.0 in | Wt 186.4 lb

## 2024-11-17 DIAGNOSIS — J432 Centrilobular emphysema: Secondary | ICD-10-CM

## 2024-11-17 DIAGNOSIS — G4733 Obstructive sleep apnea (adult) (pediatric): Secondary | ICD-10-CM

## 2024-11-17 DIAGNOSIS — I10 Essential (primary) hypertension: Secondary | ICD-10-CM

## 2024-11-17 DIAGNOSIS — R5383 Other fatigue: Secondary | ICD-10-CM

## 2024-11-17 DIAGNOSIS — J4520 Mild intermittent asthma, uncomplicated: Secondary | ICD-10-CM

## 2024-11-17 DIAGNOSIS — F39 Unspecified mood [affective] disorder: Secondary | ICD-10-CM

## 2024-11-17 DIAGNOSIS — E785 Hyperlipidemia, unspecified: Secondary | ICD-10-CM

## 2024-11-17 LAB — COMPREHENSIVE METABOLIC PANEL WITH GFR
ALT: 14 U/L (ref 0–35)
AST: 17 U/L (ref 0–37)
Albumin: 4.7 g/dL (ref 3.5–5.2)
Alkaline Phosphatase: 77 U/L (ref 39–117)
BUN: 18 mg/dL (ref 6–23)
CO2: 29 meq/L (ref 19–32)
Calcium: 9.6 mg/dL (ref 8.4–10.5)
Chloride: 103 meq/L (ref 96–112)
Creatinine, Ser: 0.77 mg/dL (ref 0.40–1.20)
GFR: 76.6 mL/min (ref >60.00)
Glucose, Bld: 94 mg/dL (ref 70–99)
Potassium: 4.3 meq/L (ref 3.5–5.1)
Sodium: 139 meq/L (ref 135–145)
Total Bilirubin: 0.6 mg/dL (ref 0.2–1.2)
Total Protein: 7.2 g/dL (ref 6.0–8.3)

## 2024-11-17 LAB — CBC WITH DIFFERENTIAL/PLATELET
Basophils Absolute: 0 K/uL (ref 0.0–0.1)
Basophils Relative: 0.4 % (ref 0.0–3.0)
Eosinophils Absolute: 0.1 K/uL (ref 0.0–0.7)
Eosinophils Relative: 1 % (ref 0.0–5.0)
HCT: 37.8 % (ref 36.0–46.0)
Hemoglobin: 13.2 g/dL (ref 12.0–15.0)
Lymphocytes Relative: 29.7 % (ref 12.0–46.0)
Lymphs Abs: 2 K/uL (ref 0.7–4.0)
MCHC: 34.9 g/dL (ref 30.0–36.0)
MCV: 95.1 fl (ref 78.0–100.0)
Monocytes Absolute: 0.4 K/uL (ref 0.1–1.0)
Monocytes Relative: 5.8 % (ref 3.0–12.0)
Neutro Abs: 4.3 K/uL (ref 1.4–7.7)
Neutrophils Relative %: 63.1 % (ref 43.0–77.0)
Platelets: 290 K/uL (ref 150.0–400.0)
RBC: 3.98 Mil/uL (ref 3.87–5.11)
RDW: 12.8 % (ref 11.5–15.5)
WBC: 6.8 K/uL (ref 4.0–10.5)

## 2024-11-17 LAB — TSH: TSH: 1.5 u[IU]/mL (ref 0.35–5.50)

## 2024-11-17 LAB — VITAMIN B12: Vitamin B-12: 194 pg/mL — ABNORMAL LOW (ref 211–911)

## 2024-11-17 MED ORDER — BUDESONIDE 0.5 MG/2ML IN SUSP: 0.5000 mg | Freq: Every day | RESPIRATORY_TRACT | 2 refills | Status: AC

## 2024-11-17 MED ORDER — ATORVASTATIN CALCIUM 40 MG PO TABS
40.0000 mg | ORAL_TABLET | Freq: Every day | ORAL | 3 refills | Status: AC
Start: 2024-11-17 — End: ?

## 2024-11-17 MED ORDER — AMLODIPINE BESYLATE 2.5 MG PO TABS: 2.5000 mg | ORAL_TABLET | Freq: Every day | ORAL | 3 refills | Status: AC

## 2024-11-17 MED ORDER — ALBUTEROL SULFATE 1.25 MG/3ML IN NEBU: 1.0000 | INHALATION_SOLUTION | Freq: Four times a day (QID) | RESPIRATORY_TRACT | 2 refills | Status: AC | PRN

## 2024-11-17 NOTE — Progress Notes (Signed)
 Leron Glance, NP-C Phone: [phone]  Claire Thomas is a 73 y.o. female who presents today for transfer of care.   Discussed the use of AI scribe software for clinical note transcription with the patient, who gave verbal consent to proceed.  History of Present Illness   Claire Thomas is a 73 year old female who presents for transfer of care with fatigue and sleep disturbances.  She experiences persistent fatigue, feeling tired throughout the day and easily falling asleep when sitting down. This issue has worsened over time. She attributes her fatigue to insufficient sleep due to a busy schedule, which includes working part-time and caring for her 23-month-old great-grandson.  She has difficulty staying asleep, frequently waking up at night with racing thoughts. Her sleep schedule involves going to bed at 10:00 PM and waking up at 4:00 AM. She has a history of undergoing a sleep study and was prescribed a CPAP machine, which she finds uncomfortable and uses inconsistently.  She has a history of asthma and COPD, using a nebulizer with albuterol  and budesonide  as needed. She experiences shortness of breath with activity but not at rest, and does not frequently wheeze or cough unless her asthma is exacerbated.  She experiences back and hip pain, attributed to arthritis and a crooked spine, which discourages her from exercising. She speculates that exercising or walking might help, but feels discouraged due to pain and fatigue.  She takes Lipitor irregularly, about two days a week, and monitors her blood pressure at home, noting fluctuations. She experiences swelling in her feet, particularly on one side, and takes amlodipine  inconsistently, which may contribute to her variable blood pressure readings.  No chest pain, dizziness, or depression. She acknowledges anxiety and a busy lifestyle contributing to her sleep disturbances. She has never taken Celexa , despite it being prescribed in the past,  as she prefers to avoid medication.      Social History   Tobacco Use  Smoking Status Never   Passive exposure: Past  Smokeless Tobacco Never  Tobacco Comments   Both parents were smokers     Current Outpatient Medications on File Prior to Visit  Medication Sig Dispense Refill   aspirin  EC 81 MG tablet Take 1 tablet (81 mg total) by mouth daily. Swallow whole. 90 tablet 3   Calcium  Carb-Cholecalciferol (CALCIUM /VITAMIN D ) 600-400 MG-UNIT TABS Take 2 tablets by mouth daily.     omeprazole (PRILOSEC) 40 MG capsule Take 40 mg by mouth daily.     Vitamin D , Ergocalciferol , (DRISDOL ) 1.25 MG (50000 UNIT) CAPS capsule Take 1 capsule (50,000 Units total) by mouth every 7 (seven) days. 12 capsule 2   No current facility-administered medications on file prior to visit.     ROS see history of present illness  Objective  Physical Exam Vitals:   11/17/24 0812 11/17/24 0831  BP: (!) 160/90 138/80  Pulse: (!) 59   Temp: 98 F (36.7 C)   SpO2: 98%     BP Readings from Last 3 Encounters:  11/17/24 138/80  02/28/24 136/82  01/30/24 (!) 160/86   Wt Readings from Last 3 Encounters:  11/17/24 186 lb 6.4 oz (84.6 kg)  02/28/24 188 lb 4 oz (85.4 kg)  01/30/24 183 lb 2 oz (83.1 kg)    Physical Exam Constitutional:      General: She is not in acute distress.    Appearance: Normal appearance.  HENT:     Head: Normocephalic.  Cardiovascular:     Rate and Rhythm: Normal  rate and regular rhythm.     Heart sounds: Normal heart sounds.  Pulmonary:     Effort: Pulmonary effort is normal.     Breath sounds: Normal breath sounds.  Skin:    General: Skin is warm and dry.  Neurological:     General: No focal deficit present.     Mental Status: She is alert.  Psychiatric:        Mood and Affect: Mood normal.        Behavior: Behavior normal.      Assessment/Plan: Please see individual problem list.  Fatigue, unspecified type Assessment & Plan: Chronic fatigue likely  multifactorial with sleep apnea and anxiety contributing. Inconsistent CPAP use affects sleep quality. She prefers non-pharmacological anxiety management. Encouraged consistent CPAP use nightly. Check lab work as outlined. Recommended trial of melatonin two hours before bedtime. We will continue to monitor.   Orders: -     Vitamin B12 -     TSH  Essential hypertension Assessment & Plan: Hypertension with variable readings due to inconsistent amlodipine  use. Elevated initially in office with improvement on second reading. Blood pressure control is essential. Instructed consistent daily amlodipine  use. Advised proper blood pressure monitoring technique. Consider diuretic if blood pressure remains uncontrolled.  Orders: -     CBC with Differential/Platelet -     Comprehensive metabolic panel with GFR -     amLODIPine  Besylate; Take 1 tablet (2.5 mg total) by mouth daily.  Dispense: 90 tablet; Refill: 3  OSA (obstructive sleep apnea) Assessment & Plan: Inconsistent CPAP use affects sleep quality. Encouraged consistent CPAP use nightly. Recommended trial of melatonin two hours before bedtime.   Mood disorder Assessment & Plan: Contributing to sleep disturbances. Prefers non-pharmacological management. Continue monitoring anxiety symptoms and consider non-pharmacological interventions.    Mild intermittent asthma, unspecified whether complicated Assessment & Plan: Managed with nebulizer treatments. Prefers nebulizer over inhalers. Refilled albuterol  nebulizer solution. Ensured budesonide  nebulizer solution refills. Follow up with Pulmonology as scheduled.   Orders: -     Budesonide ; Take 2 mLs (0.5 mg total) by nebulization daily.  Dispense: 60 mL; Refill: 2 -     Albuterol  Sulfate; Take 3 mLs (1.25 mg total) by nebulization every 6 (six) hours as needed for wheezing.  Dispense: 75 mL; Refill: 2  Centrilobular emphysema (HCC) Assessment & Plan: Managed with nebulizer treatments. Prefers  nebulizer over inhalers. Refilled albuterol  nebulizer solution. Ensured budesonide  nebulizer solution refills. Follow up with Pulmonology as scheduled.   Orders: -     Budesonide ; Take 2 mLs (0.5 mg total) by nebulization daily.  Dispense: 60 mL; Refill: 2 -     Albuterol  Sulfate; Take 3 mLs (1.25 mg total) by nebulization every 6 (six) hours as needed for wheezing.  Dispense: 75 mL; Refill: 2  Hyperlipidemia, unspecified hyperlipidemia type Assessment & Plan: Managed with Lipitor, taken inconsistently. Continue Lipitor daily.   Orders: -     Atorvastatin  Calcium ; Take 1 tablet (40 mg total) by mouth daily.  Dispense: 90 tablet; Refill: 3     Return in about 2 months (around 01/17/2025) for Follow up.   Leron Glance, NP-C [facility]

## 2024-11-18 ENCOUNTER — Encounter: Payer: Self-pay | Admitting: Nurse Practitioner

## 2024-11-24 ENCOUNTER — Encounter: Payer: Self-pay | Admitting: Nurse Practitioner

## 2024-11-24 ENCOUNTER — Ambulatory Visit: Payer: Self-pay | Admitting: Nurse Practitioner

## 2024-11-24 ENCOUNTER — Encounter

## 2024-11-24 NOTE — Assessment & Plan Note (Signed)
 Inconsistent CPAP use affects sleep quality. Encouraged consistent CPAP use nightly. Recommended trial of melatonin two hours before bedtime.

## 2024-11-24 NOTE — Assessment & Plan Note (Signed)
 Chronic fatigue likely multifactorial with sleep apnea and anxiety contributing. Inconsistent CPAP use affects sleep quality. She prefers non-pharmacological anxiety management. Encouraged consistent CPAP use nightly. Check lab work as outlined. Recommended trial of melatonin two hours before bedtime. We will continue to monitor.

## 2024-11-24 NOTE — Assessment & Plan Note (Signed)
 Managed with nebulizer treatments. Prefers nebulizer over inhalers. Refilled albuterol  nebulizer solution. Ensured budesonide  nebulizer solution refills. Follow up with Pulmonology as scheduled.

## 2024-11-24 NOTE — Assessment & Plan Note (Signed)
 Contributing to sleep disturbances. Prefers non-pharmacological management. Continue monitoring anxiety symptoms and consider non-pharmacological interventions.

## 2024-11-24 NOTE — Assessment & Plan Note (Signed)
 Managed with Lipitor, taken inconsistently. Continue Lipitor daily.

## 2024-11-24 NOTE — Assessment & Plan Note (Signed)
 Hypertension with variable readings due to inconsistent amlodipine  use. Elevated initially in office with improvement on second reading. Blood pressure control is essential. Instructed consistent daily amlodipine  use. Advised proper blood pressure monitoring technique. Consider diuretic if blood pressure remains uncontrolled.

## 2024-12-03 ENCOUNTER — Encounter: Payer: Self-pay | Admitting: Nurse Practitioner

## 2024-12-03 DIAGNOSIS — E559 Vitamin D deficiency, unspecified: Secondary | ICD-10-CM

## 2025-01-06 ENCOUNTER — Encounter

## 2025-01-07 ENCOUNTER — Ambulatory Visit
Admission: RE | Admit: 2025-01-07 | Discharge: 2025-01-07 | Disposition: A | Discharge status: Home / Self Care | Source: Ambulatory Visit | Attending: Nurse Practitioner | Admitting: Nurse Practitioner

## 2025-01-07 DIAGNOSIS — Z1231 Encounter for screening mammogram for malignant neoplasm of breast: Secondary | ICD-10-CM | POA: Insufficient documentation

## 2025-01-11 ENCOUNTER — Ambulatory Visit: Payer: Self-pay | Admitting: Nurse Practitioner

## 2025-01-21 ENCOUNTER — Ambulatory Visit: Admitting: Nurse Practitioner

## 2025-03-16 ENCOUNTER — Ambulatory Visit: Admitting: Nurse Practitioner
